# Patient Record
Sex: Male | Born: 1940 | Race: White | Hispanic: No | Marital: Married | Smoking: Former smoker
Health system: Southern US, Community
[De-identification: ages and names within clinical notes are randomized; demographics above are authoritative.]

## PROBLEM LIST (undated history)

## (undated) DIAGNOSIS — G4730 Sleep apnea, unspecified: Secondary | ICD-10-CM

## (undated) DIAGNOSIS — G629 Polyneuropathy, unspecified: Secondary | ICD-10-CM

## (undated) DIAGNOSIS — H539 Unspecified visual disturbance: Secondary | ICD-10-CM

## (undated) DIAGNOSIS — M1990 Unspecified osteoarthritis, unspecified site: Secondary | ICD-10-CM

## (undated) DIAGNOSIS — N429 Disorder of prostate, unspecified: Secondary | ICD-10-CM

## (undated) DIAGNOSIS — G2581 Restless legs syndrome: Secondary | ICD-10-CM

## (undated) DIAGNOSIS — E785 Hyperlipidemia, unspecified: Secondary | ICD-10-CM

## (undated) DIAGNOSIS — M5430 Sciatica, unspecified side: Secondary | ICD-10-CM

## (undated) DIAGNOSIS — F909 Attention-deficit hyperactivity disorder, unspecified type: Secondary | ICD-10-CM

## (undated) DIAGNOSIS — N529 Male erectile dysfunction, unspecified: Secondary | ICD-10-CM

## (undated) HISTORY — DX: Unspecified visual disturbance: H53.9

## (undated) HISTORY — DX: Unspecified osteoarthritis, unspecified site: M19.90

## (undated) HISTORY — DX: Polyneuropathy, unspecified: G62.9

## (undated) HISTORY — DX: Attention-deficit hyperactivity disorder, unspecified type: F90.9

## (undated) HISTORY — PX: COSMETIC SURGERY: SHX468

## (undated) HISTORY — DX: Sciatica, unspecified side: M54.30

## (undated) HISTORY — DX: Disorder of prostate, unspecified: N42.9

## (undated) HISTORY — DX: Hyperlipidemia, unspecified: E78.5

## (undated) HISTORY — DX: Male erectile dysfunction, unspecified: N52.9

## (undated) HISTORY — DX: Sleep apnea, unspecified: G47.30

## (undated) HISTORY — DX: Restless legs syndrome: G25.81

---

## 1995-10-04 DIAGNOSIS — I879 Disorder of vein, unspecified: Secondary | ICD-10-CM

## 1995-10-04 HISTORY — DX: Disorder of vein, unspecified: I87.9

## 1999-09-06 ENCOUNTER — Ambulatory Visit
Admit: 1999-09-06 | Disposition: A | Discharge status: Home / Self Care | Payer: Self-pay | Source: Ambulatory Visit | Admitting: Internal Medicine

## 1999-10-14 ENCOUNTER — Ambulatory Visit
Admit: 1999-10-14 | Disposition: A | Discharge status: Home / Self Care | Payer: Self-pay | Source: Ambulatory Visit | Admitting: Critical Care Medicine

## 2000-07-06 ENCOUNTER — Inpatient Hospital Stay
Admission: RE | Admit: 2000-07-06 | Disposition: A | Discharge status: Home / Self Care | Payer: Self-pay | Source: Ambulatory Visit | Admitting: Urology

## 2000-07-14 ENCOUNTER — Emergency Department: Admit: 2000-07-14 | Payer: Self-pay | Source: Emergency Department | Admitting: Emergency Medicine

## 2000-09-17 ENCOUNTER — Inpatient Hospital Stay
Admission: EM | Admit: 2000-09-17 | Disposition: A | Discharge status: Home / Self Care | Payer: Self-pay | Source: Emergency Department | Admitting: Internal Medicine

## 2001-09-03 ENCOUNTER — Ambulatory Visit
Admit: 2001-09-03 | Disposition: A | Discharge status: Home / Self Care | Payer: Self-pay | Source: Ambulatory Visit | Admitting: Internal Medicine

## 2002-07-31 ENCOUNTER — Ambulatory Visit
Admission: AD | Admit: 2002-07-31 | Disposition: A | Discharge status: Home / Self Care | Payer: Self-pay | Source: Ambulatory Visit

## 2002-08-06 ENCOUNTER — Ambulatory Visit
Admission: RE | Admit: 2002-08-06 | Disposition: A | Discharge status: Home / Self Care | Payer: Self-pay | Source: Ambulatory Visit

## 2003-03-31 DIAGNOSIS — N400 Enlarged prostate without lower urinary tract symptoms: Secondary | ICD-10-CM | POA: Insufficient documentation

## 2003-04-14 ENCOUNTER — Ambulatory Visit: Admission: RE | Admit: 2003-04-14 | Payer: Self-pay | Source: Ambulatory Visit | Admitting: Gastroenterology

## 2003-04-17 DIAGNOSIS — E785 Hyperlipidemia, unspecified: Secondary | ICD-10-CM | POA: Insufficient documentation

## 2005-03-30 ENCOUNTER — Emergency Department: Admit: 2005-03-30 | Payer: Self-pay | Source: Emergency Department | Admitting: Emergency Medical Services

## 2005-07-15 ENCOUNTER — Ambulatory Visit: Admission: RE | Admit: 2005-07-15 | Payer: Self-pay | Source: Ambulatory Visit | Admitting: Gastroenterology

## 2006-10-03 HISTORY — PX: HERNIA REPAIR: SHX51

## 2006-12-12 ENCOUNTER — Ambulatory Visit
Admit: 2006-12-12 | Disposition: A | Discharge status: Home / Self Care | Payer: Self-pay | Source: Ambulatory Visit | Admitting: Family Medicine

## 2007-11-04 ENCOUNTER — Emergency Department: Admit: 2007-11-04 | Payer: Self-pay | Source: Emergency Department | Admitting: Emergency Medicine

## 2007-11-07 ENCOUNTER — Ambulatory Visit
Admission: RE | Admit: 2007-11-07 | Disposition: A | Discharge status: Home / Self Care | Payer: Self-pay | Source: Ambulatory Visit | Admitting: Family Medicine

## 2007-11-12 ENCOUNTER — Ambulatory Visit
Admit: 2007-11-12 | Disposition: A | Discharge status: Home / Self Care | Payer: Self-pay | Source: Ambulatory Visit | Admitting: Family Medicine

## 2008-07-06 ENCOUNTER — Emergency Department: Admit: 2008-07-06 | Payer: Self-pay | Source: Emergency Department | Admitting: Emergency Medical Services

## 2009-09-02 ENCOUNTER — Emergency Department: Admit: 2009-09-02 | Payer: Self-pay | Source: Emergency Department | Admitting: Emergency Medicine

## 2009-09-02 LAB — URINALYSIS WITH MICROSCOPIC
Bilirubin, UA: NEGATIVE
Blood, UA: NEGATIVE
Glucose, UA: NEGATIVE
Ketones UA: NEGATIVE
Leukocyte Esterase, UA: NEGATIVE
Nitrite, UA: NEGATIVE
Protein, UR: NEGATIVE
Specific Gravity UA POCT: 1.02 (ref 1.001–1.035)
Urine pH: 5.5 (ref 5.0–8.0)
Urobilinogen, UA: 0.2 mg/dL
WBC, UA: NONE SEEN /HPF (ref 0–5)

## 2010-06-09 ENCOUNTER — Ambulatory Visit: Admission: RE | Admit: 2010-06-09 | Payer: Self-pay | Source: Ambulatory Visit | Admitting: Otolaryngology

## 2010-07-28 ENCOUNTER — Ambulatory Visit: Payer: Self-pay

## 2010-07-28 ENCOUNTER — Ambulatory Visit
Admission: RE | Admit: 2010-07-28 | Disposition: A | Discharge status: Home / Self Care | Payer: Self-pay | Source: Ambulatory Visit | Admitting: Gastroenterology

## 2010-07-29 LAB — LAB USE ONLY - HISTORICAL SURGICAL PATHOLOGY

## 2010-08-06 ENCOUNTER — Emergency Department: Admit: 2010-08-06 | Payer: Self-pay | Source: Emergency Department | Admitting: Emergency Medicine

## 2010-08-16 ENCOUNTER — Emergency Department: Admit: 2010-08-16 | Payer: Self-pay | Source: Emergency Department

## 2010-10-03 HISTORY — PX: KNEE ARTHROSCOPY W/ MENISCAL REPAIR: SHX1877

## 2010-10-07 ENCOUNTER — Emergency Department: Admit: 2010-10-07 | Payer: Self-pay | Source: Emergency Department | Admitting: Medical

## 2010-10-11 ENCOUNTER — Ambulatory Visit
Admit: 2010-10-11 | Disposition: A | Discharge status: Home / Self Care | Payer: Self-pay | Source: Ambulatory Visit | Admitting: Orthopaedic Surgery

## 2010-10-11 LAB — BASIC METABOLIC PANEL
Anion Gap: 6 (ref 5.0–15.0)
BUN: 17 mg/dL (ref 7–21)
CO2: 29 mEq/L (ref 22–31)
Calcium: 9.1 mg/dL (ref 8.6–10.2)
Chloride: 106 mEq/L (ref 98–107)
Creatinine: 0.8 mg/dL (ref 0.5–1.4)
Glucose: 90 mg/dL (ref 70–100)
Potassium: 5 mEq/L (ref 3.6–5.0)
Sodium: 141 mEq/L (ref 136–143)

## 2010-10-11 LAB — GFR: EGFR: >60

## 2010-10-11 LAB — CBC
Hematocrit: 40.4 % — ABNORMAL LOW (ref 42.0–52.0)
Hgb: 13.2 g/dL (ref 13.0–17.0)
MCH: 30.6 pg (ref 28.0–32.0)
MCHC: 32.7 g/dL (ref 32.0–36.0)
MCV: 93.5 fL (ref 80.0–100.0)
MPV: 9.9 fL (ref 9.4–12.3)
Platelets: 192 10*3/uL (ref 140–400)
RBC: 4.32 10*6/uL — ABNORMAL LOW (ref 4.70–6.00)
RDW: 13 % (ref 12–15)
WBC: 4.73 10*3/uL (ref 3.50–10.80)

## 2011-04-17 ENCOUNTER — Emergency Department: Admit: 2011-04-17 | Disposition: A | Discharge status: Home / Self Care | Payer: Self-pay | Source: Emergency Department

## 2011-07-12 ENCOUNTER — Ambulatory Visit
Admit: 2011-07-12 | Discharge: 2011-07-12 | Disposition: A | Discharge status: Home / Self Care | Payer: Self-pay | Source: Ambulatory Visit

## 2011-07-14 ENCOUNTER — Ambulatory Visit
Admit: 2011-07-14 | Discharge: 2011-07-14 | Disposition: A | Discharge status: Home / Self Care | Payer: Self-pay | Source: Ambulatory Visit

## 2011-07-14 LAB — CBC
Hematocrit: 43.5 % (ref 42.0–52.0)
Hgb: 14.9 g/dL (ref 13.0–17.0)
MCH: 31.9 pg (ref 28.0–32.0)
MCHC: 34.3 g/dL (ref 32.0–36.0)
MCV: 93.1 fL (ref 80.0–100.0)
MPV: 10.3 fL (ref 9.4–12.3)
Nucleated RBC: 0 /100 WBC
Platelets: 202 10*3/uL (ref 140–400)
RBC: 4.67 10*6/uL — ABNORMAL LOW (ref 4.70–6.00)
RDW: 13 % (ref 12–15)
WBC: 6.3 10*3/uL (ref 3.50–10.80)

## 2011-07-14 LAB — BASIC METABOLIC PANEL
Anion Gap: 7 (ref 5.0–15.0)
BUN: 21 mg/dL (ref 7–21)
CO2: 28 mEq/L (ref 22–31)
Calcium: 9.1 mg/dL (ref 8.6–10.2)
Chloride: 104 mEq/L (ref 98–107)
Creatinine: 1 mg/dL (ref 0.5–1.4)
Glucose: 89 mg/dL (ref 70–100)
Potassium: 4.2 mEq/L (ref 3.6–5.0)
Sodium: 139 mEq/L (ref 136–143)

## 2011-07-14 LAB — GFR: EGFR: >60

## 2011-07-15 ENCOUNTER — Emergency Department
Admit: 2011-07-15 | Disposition: A | Discharge status: Home / Self Care | Payer: Self-pay | Source: Emergency Department | Admitting: Physician Assistant

## 2011-07-19 ENCOUNTER — Emergency Department
Admit: 2011-07-19 | Disposition: A | Discharge status: Home / Self Care | Payer: Self-pay | Source: Emergency Department | Admitting: Physician Assistant

## 2011-07-22 NOTE — Op Note (Signed)
[name], [name]      MRN:          [id number]      Account:      [id number]      Document ID:  [id number]      Procedure Date: 06/09/2010            Admit Date: 06/09/2010            Patient Location: DISCHARGED 06/09/2010      Patient Type: A            SURGEON: Veneta Penton MD      ASSISTANT: Lorn Junes, DO                  PREOPERATIVE DIAGNOSES:      Brow ptosis and bilateral upper blepharochalasis.            POSTOPERATIVE DIAGNOSES:      Brow ptosis and bilateral upper blepharochalasis.            TITLE OF PROCEDURE:      Mid forehead brow lift, bilateral upper blepharoplasties.            ANESTHESIA:      General by laryngeal mask airway and a combination of 1% lidocaine with      epinephrine 1:100,000 dilution and 0.25% Marcaine with epinephrine      1:200,000 dilution, mixed in equal parts used for subcutaneous      infiltration.            ESTIMATED BLOOD LOSS:      Minimal.            COMPLICATIONS:      None.            SPECIMENS:      None.            DESCRIPTION OF PROCEDURE:      The patient was marked preoperatively in the seated position with a planned      brow elevation asymmetrically positioned within forehead rhytids.  The maximum      point of width was corresponding to the lateral edge of each pupil.  A 10-mm      ellipse was contoured corresponding to the underlying transverse forehead      rhytid. These were also separated by uninvolved central forehead skin.  The      area of the supratrochlear notch and supraorbital notch was demarcated as well.      I then marked out the area of the supratarsal crease along each upper eyelid      while also elevating the brow corresponding amount as planned for the browlift.       The amount of skin excess that was to be trimmed was estimated to       approximately 18 mm at the greatest with.  The ellipse was designed       bilaterally and then the patient was taken to the                                   Page 1 of 2      [name], [name]      MRN:          [id number]      Account:      [id number]      Document ID:  [id number]      Procedure  Date: 06/09/2010            operating room.  General anesthetic was given by laryngeal mask airway and      then local anesthetic was infiltrated along each of the forehead skin      ellipse patterns.  The entire facial area was then prepped with Betadine      solution and sterile drapes were applied.  The skin ellipse was excised on      the right mid-forehead, including dermis but just down into the immediate      subcutaneous plane.  I then elevated the inferior limb extending toward the      brow in an immediate subcutaneous plane, dissecting carefully and being      cautious not to injure any of the neurovascular tissues.  A meticulous      cautery was then performed with bipolar forceps prior to wound closure.  A      5-0 Monocryl were used to approximate the subdermal plane in an interrupted      buried fashion and then a 5-0 Prolene subcuticular was used to close the      wound margins.  A similar procedure was performed for the left brow lift.            I then examined the amount of skin redundancy that had been estimated      earlier with gentle downward pressure on the eyebrow, corresponding to the      effects of gravity.  The amount of skin redundancy was estimated to be      accurate, so I infiltrated the right upper eyelid and then the left upper      eyelid with local anesthetic mixture.  The skin was excised in the      immediate subcutaneous plane.  A small amount of orbital septum was incised      nasally and fat was delivered from the nasal compartment of the orbital      cavity in a gentle fashion.  This was cross-clamped, divided, and then a      hand-held thermal cautery used to coagulate the cut margin.  The wound was      then closed with interrupted 6-0 Prolene sutures temporally and a running      subcuticular Prolene for the remainder of the wound closure.  A similar       procedure was then performed for the left upper eyelid.  TobraDex ointment      was applied to these wounds.  The forehead incisions were approximated      earlier but Steri-Strips were now placed in a sterile fashion.  The wound      was covered with sterile 4 x 4's and a piece of gauze.  The patient was      awakened, the laryngeal mask airway was removed and he was taken to the      recovery room in stable condition.                        Electronic Signing Provider            D:  06/09/2010 20:12 PM by Dr. Veneta Penton, MD ([id number])      T:  06/10/2010 10:02 AM by [id number]                  cc:  Page 2 of 2      Authenticated and Edited by Veneta Penton [phone]) On 07/22/10 10:48:17 AM

## 2011-08-05 LAB — ECG 12-LEAD
Atrial Rate: 71 {beats}/min
P Axis: 101 degrees
P-R Interval: 162 ms
Q-T Interval: 394 ms
QRS Duration: 98 ms
QTC Calculation (Bezet): 428 ms
R Axis: -31 degrees
T Axis: 50 degrees
Ventricular Rate: 71 {beats}/min

## 2011-09-20 NOTE — Op Note (Signed)
Introduction: [id number] Document ID: [id number] -- 70 year old male      patient presents for an outpatient Colonoscopy on 07/28/2010.            Indications: Hematochezia (569.3).            Consent: The benefits, risks, and alternatives to the procedure were      discussed and informed consent was obtained from the patient.            Preparation: EKG, pulse, pulse oximetry, and blood pressure were monitored      throughout the procedure.            Medications: IVA anesthesia.            Rectal Exam: Normal rectal exam. Small external hemorrhoids (bled on      contact).            Procedure: The colonoscope was passed through the anus under direct      visualization and was advanced with ease to the cecum, confirmed by      landmarks. The scope was withdrawn and the mucosa was carefully examined.      The quality of the preparation was excellent. The views were excellent.      The patient's toleration of the procedure was excellent. Retroflexion was      performed in the rectum.            Estimated Blood Loss: Negligible.            Findings: The colon appeared to be normal. A single polyp, measuring 2 mm      in size, was found in the sigmoid colon. The polyp was completely removed      by cold biopsy polypectomy. The polyp was retrieved.            Unplanned Events: There were no unplanned events.            Summary: Small external bleeding hemorrhoids were found (455.5). Normal      colon. A single polyp was found in the sigmoid colon (211.3); removed by      cold biopsy polypectomy.            Recommendations: Start high fiber diet. Follow-up on the results of the      biopsy specimens in 2 weeks. Colonoscopy recommended in 5 to 10 years.            Procedure Codes:            Performed By:      Version 1, electronically signed by Dr. Burnetta Sabin on 07/28/2010 at      10:36.

## 2011-10-04 DIAGNOSIS — T148XXA Other injury of unspecified body region, initial encounter: Secondary | ICD-10-CM

## 2011-10-04 HISTORY — PX: ANKLE FRACTURE SURGERY: SHX122

## 2011-10-04 HISTORY — DX: Other injury of unspecified body region, initial encounter: T14.8XXA

## 2012-02-13 ENCOUNTER — Ambulatory Visit: Payer: BC Managed Care – PPO | Attending: Surgery

## 2012-02-13 DIAGNOSIS — Z01818 Encounter for other preprocedural examination: Secondary | ICD-10-CM | POA: Insufficient documentation

## 2012-02-13 LAB — CBC AND DIFFERENTIAL
Basophils Absolute Automated: 0.01 10*3/uL (ref 0.00–0.20)
Basophils Automated: 0 % (ref 0–2)
Eosinophils Absolute Automated: 0.08 10*3/uL (ref 0.00–0.70)
Eosinophils Automated: 2 % (ref 0–5)
Hematocrit: 42.3 % (ref 42.0–52.0)
Hgb: 14.4 g/dL (ref 13.0–17.0)
Immature Granulocytes Absolute: 0.01 10*3/uL
Immature Granulocytes: 0 % (ref 0–1)
Lymphocytes Absolute Automated: 1.3 10*3/uL (ref 0.50–4.40)
Lymphocytes Automated: 30 % (ref 15–41)
MCH: 31.7 pg (ref 28.0–32.0)
MCHC: 34 g/dL (ref 32.0–36.0)
MCV: 93.2 fL (ref 80.0–100.0)
MPV: 10 fL (ref 9.4–12.3)
Monocytes Absolute Automated: 0.36 10*3/uL (ref 0.00–1.20)
Monocytes: 8 % (ref 0–11)
Neutrophils Absolute: 2.63 10*3/uL (ref 1.80–8.10)
Neutrophils: 60 % (ref 52–75)
Nucleated RBC: 0 /100 WBC
Platelets: 184 10*3/uL (ref 140–400)
RBC: 4.54 10*6/uL — ABNORMAL LOW (ref 4.70–6.00)
RDW: 13 % (ref 12–15)
WBC: 4.39 10*3/uL (ref 3.50–10.80)

## 2012-02-13 LAB — ECG 12-LEAD
Atrial Rate: 59 {beats}/min
P Axis: 16 degrees
P-R Interval: 154 ms
Q-T Interval: 430 ms
QRS Duration: 92 ms
QTC Calculation (Bezet): 425 ms
R Axis: -29 degrees
T Axis: 24 degrees
Ventricular Rate: 59 {beats}/min

## 2012-02-13 LAB — BASIC METABOLIC PANEL
BUN: 17 mg/dL (ref 8.0–20.0)
CO2: 24 mEq/L (ref 21–30)
Calcium: 9.4 mg/dL (ref 7.9–10.6)
Chloride: 107 mEq/L (ref 96–109)
Creatinine: 0.8 mg/dL (ref 0.5–1.5)
Glucose: 94 mg/dL (ref 70–100)
Potassium: 5 mEq/L (ref 3.5–5.3)
Sodium: 140 mEq/L (ref 135–146)

## 2012-02-13 LAB — GFR: EGFR: >60

## 2012-02-13 LAB — HEMOLYSIS INDEX: Hemolysis Index: 10 Index — ABNORMAL HIGH (ref 0–9)

## 2012-02-14 NOTE — Pre-Procedure Instructions (Signed)
Rev/Lab/EKG done 5/13

## 2012-03-05 ENCOUNTER — Ambulatory Visit: Payer: BC Managed Care – PPO

## 2012-03-05 NOTE — H&P (Signed)
[name] is an 71 y.o. male with umbilical bulge and discomfort.    Past Medical History   Diagnosis Date   . Hyperlipidemia    . Sleep apnea      documented w/ CPAP       Allergies: No Known Allergies    Active Problems:   * No active hospital problems. *     There were no vitals taken for this visit.    Review of Systems   All other systems reviewed and are negative.        Physical Exam   Constitutional: He appears well-developed and well-nourished.   HENT:   Head: Normocephalic and atraumatic.   Eyes: Conjunctivae are normal. Pupils are equal, round, and reactive to light.   Neck: Normal range of motion. Neck supple.   Cardiovascular: Normal rate and regular rhythm.    Pulmonary/Chest: Effort normal and breath sounds normal.   Abdominal: Soft. Bowel sounds are normal.       Assessment:  Umbilical hernia     Plan:  Surgical hernia    Verneda Skill  03/05/2012

## 2012-03-06 ENCOUNTER — Ambulatory Visit
Admission: RE | Admit: 2012-03-06 | Discharge: 2012-03-06 | Disposition: A | Discharge status: Home / Self Care | Payer: BC Managed Care – PPO | Source: Ambulatory Visit | Attending: Surgery | Admitting: Surgery

## 2012-03-06 ENCOUNTER — Ambulatory Visit: Payer: BC Managed Care – PPO | Admitting: Surgery

## 2012-03-06 ENCOUNTER — Ambulatory Visit: Payer: BC Managed Care – PPO | Admitting: Anesthesiology

## 2012-03-06 ENCOUNTER — Encounter
Admission: RE | Disposition: A | Discharge status: Home / Self Care | Payer: Self-pay | Source: Ambulatory Visit | Attending: Surgery

## 2012-03-06 ENCOUNTER — Encounter: Payer: Self-pay | Admitting: Anesthesiology

## 2012-03-06 DIAGNOSIS — G4733 Obstructive sleep apnea (adult) (pediatric): Secondary | ICD-10-CM | POA: Insufficient documentation

## 2012-03-06 DIAGNOSIS — K429 Umbilical hernia without obstruction or gangrene: Secondary | ICD-10-CM | POA: Insufficient documentation

## 2012-03-06 DIAGNOSIS — E785 Hyperlipidemia, unspecified: Secondary | ICD-10-CM | POA: Insufficient documentation

## 2012-03-06 SURGERY — HERNIORRHAPHY, UMBILICAL W/WO MESH
Anesthesia: Anesthesia General | Wound class: Clean

## 2012-03-06 MED ORDER — MEPERIDINE HCL 25 MG/ML IJ SOLN
12.5000 mg | INTRAMUSCULAR | Status: DC | PRN
Start: 2012-03-06 — End: 2012-03-06

## 2012-03-06 MED ORDER — FENTANYL CITRATE 0.05 MG/ML IJ SOLN
50.0000 ug | INTRAMUSCULAR | Status: DC | PRN
Start: 2012-03-06 — End: 2012-03-06

## 2012-03-06 MED ORDER — LIDOCAINE-EPINEPHRINE 1 %-1:100000 IJ SOLN
INTRAMUSCULAR | Status: DC | PRN
Start: 2012-03-06 — End: 2012-03-06
  Administered 2012-03-06 (×2): 5 mL

## 2012-03-06 MED ORDER — FENTANYL CITRATE 0.05 MG/ML IJ SOLN
INTRAMUSCULAR | Status: DC | PRN
Start: 2012-03-06 — End: 2012-03-06
  Administered 2012-03-06: 25 ug via INTRAVENOUS
  Administered 2012-03-06: 50 ug via INTRAVENOUS

## 2012-03-06 MED ORDER — HYDROMORPHONE HCL PF 1 MG/ML IJ SOLN
0.5000 mg | INTRAMUSCULAR | Status: DC | PRN
Start: 2012-03-06 — End: 2012-03-06

## 2012-03-06 MED ORDER — PROMETHAZINE HCL 25 MG/ML IJ SOLN
6.2500 mg | Freq: Once | INTRAMUSCULAR | Status: DC | PRN
Start: 2012-03-06 — End: 2012-03-06

## 2012-03-06 MED ORDER — SODIUM CHLORIDE 0.9 % IV MBP
1.0000 g | Freq: Three times a day (TID) | INTRAVENOUS | Status: DC
Start: 2012-03-06 — End: 2012-03-06

## 2012-03-06 MED ORDER — LACTATED RINGERS IV SOLN
INTRAVENOUS | Status: DC
Start: 2012-03-06 — End: 2012-03-06

## 2012-03-06 MED ORDER — HYDROCODONE-ACETAMINOPHEN 5-325 MG PO TABS
1.0000 | ORAL_TABLET | ORAL | Status: DC | PRN
Start: 2012-03-06 — End: 2012-03-06

## 2012-03-06 MED ORDER — PROPOFOL INFUSION 10 MG/ML
INTRAVENOUS | Status: DC | PRN
Start: 2012-03-06 — End: 2012-03-06
  Administered 2012-03-06: 140 ug/kg/min via INTRAVENOUS

## 2012-03-06 MED ORDER — ONDANSETRON HCL 4 MG/2ML IJ SOLN
4.0000 mg | Freq: Once | INTRAMUSCULAR | Status: DC | PRN
Start: 2012-03-06 — End: 2012-03-06

## 2012-03-06 MED ORDER — LACTATED RINGERS IV SOLN
INTRAVENOUS | Status: DC
Start: 2012-03-06 — End: 2012-03-06
  Administered 2012-03-06: 1000 mL via INTRAVENOUS

## 2012-03-06 MED ORDER — BUPIVACAINE HCL 0.5 % IJ SOLN
INTRAMUSCULAR | Status: DC | PRN
Start: 2012-03-06 — End: 2012-03-06
  Administered 2012-03-06 (×2): 5 mL

## 2012-03-06 MED ORDER — EPHEDRINE SULFATE 50 MG/ML IJ SOLN
INTRAMUSCULAR | Status: DC | PRN
Start: 2012-03-06 — End: 2012-03-06
  Administered 2012-03-06: 10 mg via INTRAVENOUS

## 2012-03-06 MED ORDER — PROPOFOL INFUSION 10 MG/ML
INTRAVENOUS | Status: DC | PRN
Start: 2012-03-06 — End: 2012-03-06
  Administered 2012-03-06: 200 mg via INTRAVENOUS

## 2012-03-06 MED ORDER — SODIUM CHLORIDE 0.9 % IR SOLN
Status: DC | PRN
Start: 2012-03-06 — End: 2012-03-06
  Administered 2012-03-06: 250 mL

## 2012-03-06 MED ORDER — LACTATED RINGERS IV SOLN
INTRAVENOUS | Status: DC | PRN
Start: 2012-03-06 — End: 2012-03-06

## 2012-03-06 MED ORDER — ONDANSETRON HCL 4 MG/2ML IJ SOLN
INTRAMUSCULAR | Status: DC | PRN
Start: 2012-03-06 — End: 2012-03-06
  Administered 2012-03-06: 4 mg via INTRAVENOUS

## 2012-03-06 MED ORDER — HYDROCODONE-ACETAMINOPHEN 5-325 MG PO TABS
ORAL_TABLET | ORAL | Status: AC
Start: 2012-03-06 — End: 2012-03-06
  Administered 2012-03-06: 1 via ORAL
  Filled 2012-03-06: qty 1

## 2012-03-06 SURGICAL SUPPLY — 31 items
ADHESIVE SKIN SURGISEAL .35ML (Suture) ×8 IMPLANT
BANDAGE ADHESIVE L2 YD X W6 IN STRETCH (Bandage) ×1
BANDAGE ADHESIVE L2 YD X W6 IN STRETCH NONWOVEN POROUS COVER-ROLL (Bandage) ×1 IMPLANT
BETADINE SCRUB SURGICAL 4OZ (Scrub Supplies) ×2 IMPLANT
BLADE SRGCLPR LF STRL PVT ADJ HD DISP (Blade) ×1
BLADE SURGICAL CLIPPER PIVOT ADJUSTABLE (Blade) ×1
BLADE SURGICAL CLIPPER PIVOT ADJUSTABLE HEAD 9661 PURPLE (Blade) ×1 IMPLANT
BNDG CVRL ADH 2YDX6IN PLSTR POLYACRYLATE (Bandage) ×1
CONTAINER SPEC 8OZ NS SNPON LID TRNLU (Suction) ×2 IMPLANT
DRESSING TRANSPARENT L4 3/4 IN X W4 IN (Dressing) ×1
DRESSING TRANSPARENT L4 3/4 IN X W4 IN POLYURETHANE ADHESIVE (Dressing) ×1 IMPLANT
DRESSING TRNS PU STD TGDRM 4.75X4IN LF (Dressing) ×1
GLOVE SURG BIOGEL PF LTX SZ7.0 (Glove) ×2 IMPLANT
PACK LAPAROTOMY CUSTOM (Pack) ×2 IMPLANT
PATCH SURGICAL SMALL CIRCLE MONOFILAMENT (Mesh) ×1 IMPLANT
PATCH SURGICAL SMALL CIRCLE MONOFILAMENT SELF EXPAND STRAP POCKET (Mesh) ×1 IMPLANT
SLEEVE CMPR MED KN LGTH KDL SCD 21- IN (Sleeve) ×2
SLEEVE COMPRESSION MEDIUM KNEE LENGTH KENDALL SEQUENTIAL OD21- IN (Sleeve) ×1 IMPLANT
SOLUTION IRR 0.9% NACL 1000ML LF STRL (Irrigation Solutions) ×1
SOLUTION IRRIGATION 0.9% SODIUM CHLORIDE (Irrigation Solutions) ×1
SOLUTION IRRIGATION 0.9% SODIUM CHLORIDE 1000 ML PLASTIC POUR BOTTLE (Irrigation Solutions) ×1 IMPLANT
SPONGE GAUZE 12PLY STRL 4X4IN (Sponge) ×2 IMPLANT
SUTURE ABS 3-0 SH VCL 27IN BRD COAT VIOL (Suture) ×1
SUTURE COATED VICRYL 3-0 SH L27 IN BRAID (Suture) ×1
SUTURE COATED VICRYL 3-0 SH L27 IN BRAID COATED VIOLET ABSORBABLE (Suture) ×1 IMPLANT
SUTURE ETHIBOND 0 CTX 8X18IN (Suture) ×2 IMPLANT
SUTURE PLAIN 2-0 CT1 27IN (Suture) ×2 IMPLANT
SUTURE VICRYL 4-0 PS2 27IN (Suture) ×2 IMPLANT
TOWEL STERILE REUSABLE 8PK (Procedure Accessories) ×2 IMPLANT
TRAY PH SKIN SCRUB GEL TRAY (Tray) ×2 IMPLANT
TRAY SKIN DRY SCRUB (Tray) ×2 IMPLANT

## 2012-03-06 NOTE — Discharge Instructions (Addendum)
PAIN PILL GIVEN AT 2:18    Pain Medication:   May use over the counter medications, Motrin , Advil, Aleeve.   Take prescription medication with some food to avoid upset stomach.   Take stool softner or laxitive while taking narcotic pain medications which will cause constipation.       Wound Care:   Apply ice pack for your comfort.   May shower in AM after removing the dressing.  If you have steri-strips, do not remove.   Expect some swelling and bruising around the surgical site  ( which may extend to the genital area if inguinal hernia.)    Activities:   As tolerated.   Avoid heavy lifting or straining till pain free.   Avoid driving while taking narcotic pain medications.     Diet:  As tolerated.    Follow up:   Call [phone] for appointment in 7 to 10 days post op.              Anesthesia: After Your Surgery  You've just had surgery. During surgery, you received medication called anesthesia to keep you comfortable and pain-free. After surgery, you may experience some pain or nausea. This is normal. Here are some tips for feeling better and recovering after surgery.     Stay on schedule with your medication.   Going Home  Your doctor or nurse will show you how to take care of yourself when you go home. He or she will also answer your questions. Have an adult family member or friend drive you home. For the first 24 hours after your surgery:   Do not drive or use heavy equipment.   Do not make important decisions or sign documents.   Avoid alcohol.   Have someone stay with you, if possible. They can watch for problems and help keep you safe.  Be sure to keep all follow-up doctor's appointments. And rest after your procedure for as long as your doctor tells you to.  Coping with Pain  If you have pain after surgery, pain medication will help you feel better. Take it as directed, before pain becomes severe. Also, ask your doctor or pharmacist about other ways to control pain, such as with heat, ice, and  relaxation. And follow any other instructions your surgeon or nurse gives you.  Tips for Taking Pain Medication  To get the best relief possible, remember these points:   Pain medications can upset your stomach. Taking them with a little food may help.   Most pain relievers taken by mouth need at least 20 to 30 minutes to take effect.   Taking medication on a schedule can help you remember to take it. Try to time your medication so that you can take it before beginning an activity, such as dressing, walking, or sitting down for dinner.   Constipation is a common side effect of pain medications. Drink lots of fluids. Eating fruit and vegetables can also help. Don't take laxatives unless your surgeon has prescribed them.   Mixing alcohol and pain medication can cause dizziness and slow your breathing. It can even be fatal. Don't drink alcohol while taking pain medication.   Pain medication can slow your reflexes. Don't drive or operate machinery while taking pain medication.  Managing Nausea  Some people have an upset stomach after surgery. This is often due to anesthesia, pain, pain medications, or the stress of surgery. The following tips will help you manage nausea and get good nutrition as  you recover. If you were on a special diet before surgery, ask your doctor if you should follow it during recovery. These tips may help:   Don't push yourself to eat. Your body will tell you what to eat and when.   Start off with liquids and soup. They are easier to digest.   Progress to semisolids (mashed potatoes, applesauce, and gelatin) as you feel ready.   Slowly move to solid foods. Don't eat fatty, rich, or spicy foods at first.   Don't force yourself to have three large meals a day. Instead, eat smaller amounts more often.   Take pain medications with a small amount of solid food, such as crackers or toast.  Call Your Surgeon If.   You still have pain an hour after taking medication (it may not be strong  enough).   You feel too sleepy, dizzy, or groggy (medication may be too strong).   You have side effects like nausea, vomiting, or skin changes (rash, itching, or hives).    [address], [address], [place], [place] [address]. All rights reserved. This information is not intended as a substitute for professional medical care. Always follow your healthcare professional's instructions.

## 2012-03-06 NOTE — Transfer of Care (Signed)
Anesthesia Transfer of Care Note    Patient: [name]    Procedures performed: Procedure(s) with comments:  HERNIORRHAPHY, UMBILICAL - HERNIORRHAPHY, UMBILICAL     Anesthesia type: General LMA    Patient location:PACU    Last vitals:   Filed Vitals:    03/06/12 1349   BP: 101/59   Pulse: 71       Resp: 16   SpO2: 99%       Post pain: Patient not complaining of pain, continue current therapy     Mental Status:awake and alert     Respiratory Function: tolerating face mask    Cardiovascular: stable    Nausea/Vomiting: patient not complaining of nausea or vomiting    Hydration Status: adequate    Post assessment: no apparent anesthetic complications, no reportable events and no evidence of recall

## 2012-03-06 NOTE — Brief Op Note (Signed)
BRIEF OP NOTE    Date Time: 03/06/2012 1:37 PM    Patient Name:   [name]    Date of Operation:   03/06/2012    Providers Performing:   Surgeon(s):  Verneda Skill, MD    Assistant (s):  Leticia Clas [facility]  Operative Procedure:   Procedure(s):  HERNIORRHAPHY, UMBILICAL    Preoperative Diagnosis:   Pre-Op Diagnosis Codes:     * Umbilical hernia without mention of obstruction or gangrene [553.1]    Postoperative Diagnosis:   * No post-op diagnosis entered *    Anesthesia:   Choice    Estimated Blood Loss:        Specimens:       Findings:   See my dictation    Complications:   none      Signed by: Verneda Skill, MD                                                                              [facility] OR

## 2012-03-06 NOTE — Progress Notes (Signed)
cpap checked by bio-med.  Placed in asc pacu

## 2012-03-06 NOTE — Anesthesia Postprocedure Evaluation (Signed)
Anesthesia Post Evaluation    Patient: [name]    Procedures performed: Procedure(s) with comments:  HERNIORRHAPHY, UMBILICAL - HERNIORRHAPHY, UMBILICAL     Anesthesia type: General LMA    Patient location: PACU    Post pain: Patient not complaining of pain, continue current therapy    Level of consciousness: awake and alert     Respiratory: tolerating room air    Cardiovascular: stable    Post assessment: no apparent anesthetic complications, no reportable events and no evidence of recall    Other complications: none

## 2012-03-06 NOTE — PACU (Signed)
Mid abdominal dressing is dry and clean.  No drains in place.

## 2012-03-06 NOTE — Anesthesia Preprocedure Evaluation (Addendum)
Anesthesia Evaluation    AIRWAY    Mallampati: III    TM distance: >3 FB  Neck ROM: limited  Mouth Opening:full   CARDIOVASCULAR    cardiovascular exam normal and regular     DENTAL         PULMONARY    pulmonary exam normal and clear to auscultation     OTHER FINDINGS    Labs wnl    EKG: SB, LAD, no ST changes          Anesthesia Plan    ASA 3   general         Post op pain management: per surgeon  informed consent obtained

## 2012-03-07 NOTE — Op Note (Signed)
Procedure Date: 03/06/2012     Patient Type: A     SURGEON: Verneda Skill MD  ASSISTANT:       PREOPERATIVE DIAGNOSIS:  Umbilical hernia.     POSTOPERATIVE DIAGNOSIS:  Umbilical hernia.       TITLE OF PROCEDURE:  Repair of umbilical hernia.     ANESTHESIA:  General with LMA.     INDICATIONS FOR THE SURGERY:    This is a 71 year old male with a symptomatic periumbilical bulge.  The  elective repair of umbilical hernia was recommended and discussed, and  informed consent was obtained prior to the procedure.     DESCRIPTION OF PROCEDURE:  The procedure is as follows:  The patient was placed on the operating room  table in supine position after all required preoperative procedures were  completed.  Then, after the induction of general anesthesia with LMA, the  mid abdomen was prepped and draped in usual sterile fashion.  After use of  local anesthetic, the periumbilical incision above the umbilicus was made;  and with dissection, the hernia sac was dissected to the level of the linea  alba.  Hernia sac was also dissected off the dermis of the umbilicus, and  this completed a complete circumferential dissection.  Hernia sac was then  incised at the level of the linea alba, and the content was reduced and the  anterior abdominal wall was clear of any adhesions.  Hernia defect measured  approximately 1 cm in diameter.  A small Ventralex mesh was then inserted  into the underlay position; and using attached polypropylene strap, the  polypropylene side of the mesh was secured to the fascial edges with 2-0  Ethibond sutures in interrupted fashion.  Adequate hemostasis was achieved  and the umbilicus was tacked against the repaired fascia to invert.  An  incision was closed with absorbable suture in multiple layers.  The patient  tolerated the procedure well and was transferred to recovery room in  satisfactory condition.  Estimated blood loss was minimum, and the counts  were correct at the end of the procedure.  There were  no intraoperative  complications.           D:  03/06/2012 18:03 PM by Dr. Verneda Skill, MD [id number])  T:  03/07/2012 06:56 AM by [id number]      Everlean Cherry: [id number]) (Doc ID: [id number])

## 2012-10-05 NOTE — Op Note (Signed)
MRN: [id number] DOCUMENT ID: [id number]      INTRODUCTION:      72 YEAR OLD MALE PATIENT PRESENTS FOR AN ELECTIVE OUTPATIENT COLONOSCOPY.      THE INDICATIONS FOR THE PROCEDURE WERE HEMATOCHEZIA AND FOLLOW-UP OF      COLONIC POLYPS.      CONSENT:      THE BENEFITS, RISKS, AND ALTERNATIVES TO THE PROCEDURE WERE DISCUSSED AND      INFORMED CONSENT WAS OBTAINED.      PREPARATION:      EKG, PULSE, PULSE OXIMETRY AND BLOOD PRESSURE MONITORED.      MEDICATIONS:      - MAC ANESTHESIA WAS ADMINISTERED DURING THE PROCEDURE      PROCEDURE:      RECTAL EXAM: SMALL BLEEDING EXTERNAL HEMORRHOIDS WERE PRESENT.      THE ENDOSCOPE WAS PASSED WITHOUT DIFFICULTY UNDER DIRECT VISUALIZATION TO      THE TERMINAL ILEUM CONFIRMED BY LANDMARKS, PHOTOGRAPH, APPENDICEAL ORIFICE      AND ILEOCECAL VALVE.  RETROFLEXION WAS PERFORMED IN THE RECTUM.  THE      QUALITY OF THE PREPARATION WAS VERY GOOD.  WITHDRAWAL TIME, CECUM TO END      LASTED 9 MINUTES.      FINDINGS:  SINGLE POLYP WAS SEEN IN THE TRANSVERSE COLON WHICH MEASURED 2      MM.  THE FIRST POLYP WAS REMOVED BY EXCISIONAL BIOPSY.  THE COLONOSCOPY      WAS OTHERWISE NORMAL.      COMPLICATIONS:      THERE WERE NO COMPLICATIONS ASSOCIATED WITH THE PROCEDURE.      IMPRESSION:      1.  SMALL BLEEDING EXTERNAL HEMORRHOIDS [455.5].      2.  SINGLE POLYP IN THE TRANSVERSE COLON. [211.3]. THE FIRST POLYP WAS      REMOVED BY EXCISIONAL BIOPSY.      3.  COLONOSCOPY, OTHERWISE NORMAL.      RECOMMENDATION:      - HIGH FIBER DIET.      - FOLLOW-UP ON THE RESULTS OF BIOPSY SPECIMENS IN 2 WEEKS.      - SCHEDULE COLONOSCOPY IN 5 YEARS.      SIGNING PHYSICIAN: Tayvion Lauder A

## 2013-06-19 ENCOUNTER — Encounter (INDEPENDENT_AMBULATORY_CARE_PROVIDER_SITE_OTHER): Payer: Self-pay | Admitting: Cardiovascular Disease

## 2013-06-19 ENCOUNTER — Ambulatory Visit (INDEPENDENT_AMBULATORY_CARE_PROVIDER_SITE_OTHER): Payer: BC Managed Care – PPO | Admitting: Cardiovascular Disease

## 2013-06-19 VITALS — BP 142/82 | HR 74 | Ht 73.0 in | Wt 193.0 lb

## 2013-06-19 DIAGNOSIS — R42 Dizziness and giddiness: Secondary | ICD-10-CM | POA: Insufficient documentation

## 2013-06-19 NOTE — Progress Notes (Signed)
[facility]- Cardiology Consultation Note    Chief Complaint   Patient presents with   . Dizziness       Referring Physician: Sheran Luz, MD     Date of Consultation: 06/19/2013      HPI:[name] is a 72 year old gentleman referred by Dr. Darcel Bayley for  episodes of feeling dizzy.  He has not had any spells which last about 5 to  10 seconds, when he feels dizzy like he is going to faint, but he has not  actually fainted.  He feels a little facial flushing at the time.  He has  never had true syncope.  He has no associated symptoms of tachycardia with  it.       Extensive cardiac review of systems is negative.  He has never had  exertional chest pain or chest pressure.  He has not had PND, orthopnea, or  lower extremity edema.  He took a 10 mile kayaking trip and was able to do  physical exertion this past month and had no chest pain or shortness of  breath with that.     With regard to risk factors for coronary disease, he has no history of  hypertension, diabetes, or tobacco abuse.  He does not have  hypercholesterolemia.  There is no family history for premature coronary  disease.     ALLERGIES:  None known.     PAST SURGICAL HISTORY:  Remarkable for left knee surgery and left ankle surgery.     PAST MEDICAL HISTORY:  Remarkable for restless leg syndrome and difficulty sleeping.     PRESENT MEDICAL REGIMEN:  Trazodone 150 mg p.o. nightly, Requip for restless legs, Xalatan p.r.n.,  Cialis p.r.n.          Review of Systems:  All other systems were reviewed and are negative unless stated above per HPI    Past Medical History   Diagnosis Date   . Hyperlipidemia    . Sleep apnea      documented w/ CPAP       Past Surgical History   Procedure Date   . Left leg fracture 2012   . Colonoscopy    . Repair torn meniscus 2012     left       History     Social History   . Marital Status: Married     Spouse Name: N/A     Number of Children: N/A   . Years of Education: N/A     Occupational History   . Not  on file.     Social History Main Topics   . Smoking status: Never Smoker    . Smokeless tobacco: Not on file   . Alcohol Use: Yes      Comment: 1-2 beers daily   . Drug Use: No   . Sexually Active:      Other Topics Concern   . Not on file     Social History Narrative   . No narrative on file           No Known Allergies    Current outpatient prescriptions:amphetamine-dextroamphetamine (ADDERALL) 10 MG tablet, , Disp: , Rfl: ;  atorvastatin (LIPITOR) 10 MG tablet, , Disp: , Rfl: ;  pravastatin (PRAVACHOL) 10 MG tablet, Take 10 mg by mouth every evening., Disp: , Rfl: ;  ROPINIRole HCl (REQUIP PO), Take 1 tablet by mouth daily., Disp: , Rfl: ;  traZODone (DESYREL) 150 MG tablet, Take 150 mg by mouth  nightly., Disp: , Rfl:   zaleplon (SONATA) 10 MG capsule, , Disp: , Rfl:       Physical Examination:  Filed Vitals:    06/19/13 1529   BP: 142/82   Pulse: 74     General: In NAD, well developed, well nourished.  HEENT: sclera is normal, mucous membranes are moist, neck is supple. No carotid bruits  Cardiovascular: S1 S2 heard, regular rate and rhythm, without murmurs, rubs or gallops  Lungs: Clear to auscultation b/l, normal excursion, no rhonchi, rales or wheezes b/l  Abdomen: +bs, soft, nontender, non-distended, no organomegaly   Extremities: no edema, 2+ pulses b/l in upper and lower extremities  Skin: no rashes or lesions noted  Neurological: nonfocal, moves all extremities well  Psychiatric: alert and oriented x 3, normal affect  Lymphatics: no lymphadenopathy        Impression and Recommendations:  [name] is a 72 y.o. who presents for consultation regarding dizzy spells    1. Dizziness and giddiness        EKG today showed NSR  ASSESSMENT AND PLAN:  1.  Dizzy spells -  The patient's physical examination today is normal.  EKG showed  normal sinus rhythm.  The differential, however, would include bradycardia  or tachycardia.  His EKG shows left anterior fascicular block.  Based on  this, a 24-hour Holter  monitor will be scheduled with any further  recommendations depending on the results.    2.  Elevated systolic pressure.  The patient was counseled to record serial  blood pressure readings at home and report back to [place] with the results.       Any further recommendations depending on the results of his Holter.        Thank you for allowing [place] to participate in this patient's care. Please call me with any questions.    Sylvan Cheese, M.D., [facility]

## 2013-06-24 ENCOUNTER — Ambulatory Visit (INDEPENDENT_AMBULATORY_CARE_PROVIDER_SITE_OTHER): Payer: BC Managed Care – PPO | Admitting: Cardiovascular Disease

## 2013-06-24 DIAGNOSIS — R42 Dizziness and giddiness: Secondary | ICD-10-CM

## 2013-06-24 NOTE — Procedures (Signed)
Patient connected to 24-hour Holter monitor.  Provided instructions on wearing monitor; instructed to record activities in monitor journal.  Pt verbalized understanding.

## 2013-06-27 ENCOUNTER — Telehealth (INDEPENDENT_AMBULATORY_CARE_PROVIDER_SITE_OTHER): Payer: Self-pay | Admitting: Cardiovascular Disease

## 2013-06-27 NOTE — Procedures (Signed)
Mr. [name] is a 72 year old gentleman who had a 24-hour Holter  monitor performed on June 24, 2013.  The predominant rhythm was normal  sinus.  Average heart rate 79.  Minimum heart rate 47.  Maximum heart rate  119.  There were occasional PVCs.  There were occasional premature atrial  contractions (PACs) and nonsustained supraventricular tachycardia.  No  atrial fibrillation was treated and no ventricular tachycardia was seen.   No symptoms were noted.

## 2013-06-27 NOTE — Telephone Encounter (Signed)
From Monday 9/22  [phone]  Thank you.

## 2013-06-28 NOTE — Telephone Encounter (Signed)
PER [facility]/SCHEDULED ECHO

## 2013-07-01 ENCOUNTER — Ambulatory Visit (INDEPENDENT_AMBULATORY_CARE_PROVIDER_SITE_OTHER): Payer: BC Managed Care – PPO | Admitting: Cardiovascular Disease

## 2013-07-16 ENCOUNTER — Telehealth (INDEPENDENT_AMBULATORY_CARE_PROVIDER_SITE_OTHER): Payer: Self-pay | Admitting: Cardiovascular Disease

## 2013-07-16 NOTE — Telephone Encounter (Addendum)
FROM 9/29:  [phone]  THANK YOU

## 2013-07-17 ENCOUNTER — Telehealth (INDEPENDENT_AMBULATORY_CARE_PROVIDER_SITE_OTHER): Payer: Self-pay | Admitting: Cardiovascular Disease

## 2013-07-17 NOTE — Telephone Encounter (Signed)
Patient returning your call regarding echo results.  Patient's # [phone]    Thank you,  [name]    07/16/2013 4:33 PM   Telephone   MRN: [id number]    Description:  72 year old male   Provider:  Marcille Buffy, MD   Department:  [facility]         Reason for Call      ?ECHO RESULTS?          Call Documentation      Vance Peper  07/16/2013  4:40 PM  Addendum   FROM 9/29:  [phone]  [facility]         Dear [name],    Thank you for referring Mr. Sheryle Spray to me for evaluation. Below you will find my report on the echo performed at our facility.I can  find no cardiac etiology for his dizzy spells.    I appreciate the opportunity to participate in the care of your patient.  Please do not hesitate to contact me with any questions or concerns.    Sincerely,    Marcille Buffy, MD, [facility]                                                                                ECHOCARDIOGRAM REPORT    Name: [name] Age: 72 y.o. Gender: male Date: 07/01/2013   Clinical history: dizziness Height:  73in Weight: 193 lb   Physician: Vania Rea. Dareen Piano, M.D. cc: Sheran Luz, MD   Quality of study: Good Tech: MK BP 140/85 Tape Number: Dr Darlin Coco #2     M-MODE AND 2-D DIMENSIONS  Patient values  Normal Values  Patient Values  Normal Values   Aortic Root: 36 mm 20-37 mm RVIDd 23 mm 7-23 mm   Aortic Leaflet Separation: 23 mm 15-26 mm IVSDd 9 mm 6-11 mm   Left Atrium: 41 mm 18-40 mm LVPWDd 9 mm 6-11 mm   IVC 21 mm  LVIDd 54 mm 37-56 mm       LVIDs 32 mm 20-40 mm   Left Atrium [facility]):  mm 15-40 mm FS% 40 % 28-45%   Right Atrium [facility]):  mm (varies) EF% 60-65 % 50-75%     DOPPLER/HEMODYNAMIC ANALYSIS  MV Inflow:   normal Decel Time:     Estimated RVSP: 47mm Hg TR Vmax 3.52m/sec RAP estim: 5 mm Hg     Valve Pk Vel. Normal (m/sec) Regurgitation Area (cm2) Mn Grad. Pk Grad. LVOT   Aortic  1.0 1-1.7  moderate           Mitral 0.5 0.6-1.3  mild           Tricuspid  0.5 0.3-0.7  mild            Pulmonic  0.8 0.6-1.0  mild           LVOT  1.0 0.7-1.7                 FINAL IMPRESSIONS:  1. Left Ventricle: Normal left ventricular size, thickness, and function with an EF of 60-65% with no regional wall motion abnormalities.  2. Right Ventricle: Normal size and function.   3. Pulmonary Arteries: mild elevation pulmonary artery pressures with an estimated RVSP of 42mm Hg.  4. Left Atrium: dilated  5. Right Atrium: dilated  6. Aortic Valve: trileaflet, opens well with no stenosis ,moderate regurgitation.  7. Mitral Valve: normal structure and function, MAC , mild thickening with no stenosis mild regurgitation.  8. Tricuspid Valve: normal in morphology with mild regurgitation.  9. Pulmonic Valve: Trace PI by Doppler.  10. Pericardium: no pericardial effusion.   11. IVC: normal size. Normal respiratory changes.  12. Aorta: normal aortic root size, aortic arch normal size.  13. Diastolic Function: Tissue Doppler velocities and mitral inflow pattern consistent with normal diastolic function.  14. No old studies    Vania Rea. Dareen Piano, M.D., [facility]

## 2013-07-17 NOTE — Procedures (Signed)
ECHOCARDIOGRAM REPORT    Name: [name] Age: 72 y.o. Gender: male Date: 07/01/2013   Clinical history: dizziness Height:  73in Weight: 193 lb   Physician: Vania Rea. Dareen Piano, M.D. cc: Sheran Luz, MD   Quality of study: Good Tech: MK BP 140/85 Tape Number: Dr Darlin Coco #2     M-MODE AND 2-D DIMENSIONS  Patient values  Normal Values  Patient Values  Normal Values   Aortic Root: 36 mm 20-37 mm RVIDd 23 mm 7-23 mm   Aortic Leaflet Separation: 23 mm 15-26 mm IVSDd 9 mm 6-11 mm   Left Atrium: 41 mm 18-40 mm LVPWDd 9 mm 6-11 mm   IVC 21 mm  LVIDd 54 mm 37-56 mm       LVIDs 32 mm 20-40 mm   Left Atrium [facility]):  mm 15-40 mm FS% 40 % 28-45%   Right Atrium [facility]):  mm (varies) EF% 60-65 % 50-75%     DOPPLER/HEMODYNAMIC ANALYSIS  MV Inflow:   normal Decel Time:     Estimated RVSP: 47mm Hg TR Vmax 3.64m/sec RAP estim: 5 mm Hg     Valve Pk Vel. Normal (m/sec) Regurgitation Area (cm2) Mn Grad. Pk Grad. LVOT   Aortic  1.0 1-1.7  moderate           Mitral 0.5 0.6-1.3  mild           Tricuspid  0.5 0.3-0.7  mild           Pulmonic  0.8 0.6-1.0  mild           LVOT  1.0 0.7-1.7                 FINAL IMPRESSIONS:  1. Left Ventricle: Normal left ventricular size, thickness, and function with an EF of 60-65% with no regional wall motion abnormalities.  2. Right Ventricle: Normal size and function.   3. Pulmonary Arteries: mild elevation pulmonary artery pressures with an estimated RVSP of 42mm Hg.  4. Left Atrium: dilated  5. Right Atrium: dilated  6. Aortic Valve: trileaflet, opens well with no stenosis ,moderate regurgitation.  7. Mitral Valve: normal structure and function, MAC , mild thickening with no stenosis mild regurgitation.  8. Tricuspid Valve: normal in morphology with mild regurgitation.  9. Pulmonic Valve: Trace PI by Doppler.  10. Pericardium: no pericardial effusion.   11. IVC: normal size. Normal respiratory changes.  12. Aorta: normal  aortic root size, aortic arch normal size.  13. Diastolic Function: Tissue Doppler velocities and mitral inflow pattern consistent with normal diastolic function.  14. No old studies    Vania Rea. Dareen Piano, M.D., [facility]

## 2013-07-19 NOTE — Telephone Encounter (Signed)
Mr. Moyano is calling saying he did get a msg on his voicemail that was a man's voice but was not you and it said it was regarding his echo.  He is waiting to hear abt results.  [phone]  Thank you.

## 2013-07-22 NOTE — Telephone Encounter (Signed)
Called     kmr

## 2013-07-22 NOTE — Telephone Encounter (Signed)
[name] called this morning - he said he just realized the volume button on his phone was down to almost silent so any time you called he could not hear the ringer.  He offers profuse apologies.  A number today is [phone]  Thank you.

## 2013-07-31 NOTE — Telephone Encounter (Signed)
See telephone encounter 07/16/13.

## 2013-10-14 ENCOUNTER — Ambulatory Visit
Admission: RE | Admit: 2013-10-14 | Discharge: 2013-10-14 | Disposition: A | Discharge status: Home / Self Care | Payer: BC Managed Care – PPO | Source: Ambulatory Visit | Attending: Urology | Admitting: Urology

## 2013-10-14 DIAGNOSIS — R972 Elevated prostate specific antigen [PSA]: Secondary | ICD-10-CM | POA: Insufficient documentation

## 2013-10-14 LAB — PSA: Prostate Specific Antigen, Total: 4.898 ng/mL — ABNORMAL HIGH (ref 0.000–4.000)

## 2014-01-14 ENCOUNTER — Encounter (INDEPENDENT_AMBULATORY_CARE_PROVIDER_SITE_OTHER): Payer: Self-pay | Admitting: Cardiovascular Disease

## 2014-03-04 ENCOUNTER — Ambulatory Visit (INDEPENDENT_AMBULATORY_CARE_PROVIDER_SITE_OTHER): Payer: BC Managed Care – PPO | Admitting: Family Medicine

## 2014-03-04 ENCOUNTER — Encounter (INDEPENDENT_AMBULATORY_CARE_PROVIDER_SITE_OTHER): Payer: Self-pay

## 2014-03-04 VITALS — BP 138/79 | HR 75 | Temp 97.7°F | Resp 16 | Ht 73.0 in | Wt 180.0 lb

## 2014-03-04 DIAGNOSIS — H6591 Unspecified nonsuppurative otitis media, right ear: Secondary | ICD-10-CM

## 2014-03-04 DIAGNOSIS — H6590 Unspecified nonsuppurative otitis media, unspecified ear: Secondary | ICD-10-CM

## 2014-03-04 MED ORDER — CEFDINIR 300 MG PO CAPS
ORAL_CAPSULE | ORAL | Status: DC
Start: 2014-03-04 — End: 2014-03-30

## 2014-03-04 NOTE — Progress Notes (Signed)
Subjective:       Patient ID: [name] is a 73 y.o. male.  Chief Complaint   Patient presents with   . Otalgia     c/o right ear pain for 2 days. H/o ear drum perforation. Self medicated with debrox.       Otalgia   There is pain in the right ear. The current episode started in the past 7 days. The problem occurs constantly. The problem has been gradually worsening. There has been no fever. The pain is mild. Associated symptoms include ear discharge. Pertinent negatives include no abdominal pain, coughing, diarrhea, headaches, hearing loss, neck pain, rhinorrhea, sore throat or vomiting. He has tried ear drops for the symptoms. The treatment provided mild relief.       The following portions of the patient's history were reviewed and updated as appropriate: allergies, current medications, past family history, past medical history, past social history, past surgical history and problem list.    Review of Systems   Constitutional: Negative for fever.   HENT: Positive for ear discharge and ear pain. Negative for hearing loss, rhinorrhea, sore throat and trouble swallowing.    Eyes: Negative for pain.   Respiratory: Negative for cough.    Gastrointestinal: Negative for vomiting, abdominal pain and diarrhea.   Musculoskeletal: Negative for neck pain.   Neurological: Negative for headaches.           Objective:     Physical Exam   Nursing note and vitals reviewed.  Constitutional: He appears well-developed and well-nourished. No distress.   HENT:   Head: Normocephalic and atraumatic.   Right Ear: Hearing, external ear and ear canal normal. Tympanic membrane is erythematous and bulging. A middle ear effusion is present.   Left Ear: Hearing, tympanic membrane, external ear and ear canal normal.   Mouth/Throat: Oropharynx is clear and moist.   Eyes: Conjunctivae and EOM are normal.   Neck: Normal range of motion. Neck supple.   Cardiovascular: Normal rate, regular rhythm and normal heart sounds.    Pulmonary/Chest:  Effort normal and breath sounds normal.   Musculoskeletal: Normal range of motion.   Neurological: He is alert.   Skin: Skin is warm and dry.   Psychiatric: He has a normal mood and affect.               Assessment & Plan    1. Otitis media with effusion, right    - cefdinir (OMNICEF) 300 MG capsule; One capsule by mouth twice daily x 10 days  Dispense: 20 capsule; Refill: 0  Reviewed prescribed medications and side effects with patient.

## 2014-03-30 ENCOUNTER — Emergency Department: Payer: BC Managed Care – PPO

## 2014-03-30 ENCOUNTER — Emergency Department
Admission: EM | Admit: 2014-03-30 | Discharge: 2014-03-30 | Disposition: A | Discharge status: Home / Self Care | Payer: BC Managed Care – PPO | Attending: Emergency Medical Services | Admitting: Emergency Medical Services

## 2014-03-30 DIAGNOSIS — S63502A Unspecified sprain of left wrist, initial encounter: Secondary | ICD-10-CM

## 2014-03-30 DIAGNOSIS — S63509A Unspecified sprain of unspecified wrist, initial encounter: Secondary | ICD-10-CM | POA: Insufficient documentation

## 2014-03-30 DIAGNOSIS — IMO0002 Reserved for concepts with insufficient information to code with codable children: Secondary | ICD-10-CM | POA: Insufficient documentation

## 2014-03-30 DIAGNOSIS — T148XXA Other injury of unspecified body region, initial encounter: Secondary | ICD-10-CM

## 2014-03-30 HISTORY — DX: Restless legs syndrome: G25.81

## 2014-03-30 MED ORDER — HYDROCODONE-ACETAMINOPHEN 5-325 MG PO TABS
1.00 | ORAL_TABLET | Freq: Four times a day (QID) | ORAL | Status: DC | PRN
Start: 2014-03-30 — End: 2015-04-20

## 2014-03-30 MED ORDER — HYDROCODONE-ACETAMINOPHEN 5-325 MG PO TABS
1.00 | ORAL_TABLET | Freq: Once | ORAL | Status: AC
Start: 2014-03-30 — End: 2014-03-30
  Administered 2014-03-30: 1 via ORAL
  Filled 2014-03-30: qty 1

## 2014-03-30 NOTE — Discharge Instructions (Signed)
Abrasion    You have been diagnosed with an abrasion. This is a scrape of the outer skin layers.    Take off old dressings every day. Then put on a clean, dry dressing. If the dressing sticks to the wound, moisten it with water. This way, it can come off more easily.    Keep the wound clean and dry for the next 24 hours. You can wash the wound gently with soap and water. Then put on a dry bandage if needed, to protect it.    Put a thin layer of antibiotic ointment on the wound 2-3 times a day. This can be Polysporin / triple antibiotic. This can help prevent infection. It may help keep scarring to a minimum.    YOU SHOULD SEEK MEDICAL ATTENTION IMMEDIATELY, EITHER HERE OR AT THE NEAREST EMERGENCY DEPARTMENT, IF ANY OF THE FOLLOWING OCCURS:   Unusual redness or swelling.   There are red streaks going up the arm or leg.   The wound smells bad or has a lot of drainage.   Fever (temperature higher than 100.4F / 38C), chills, more pain and / or swelling.

## 2014-03-30 NOTE — ED Notes (Signed)
Cutting wood with a table saw and the block of wood hit fence and then flew back and hit his chest and cut his arm. Small laceration and large bruise; +swelling. 10/10 pain.

## 2014-03-31 NOTE — ED Provider Notes (Signed)
Physician/Midlevel provider first contact with patient: 03/30/14 2111     73 yom, was using a table saw to cut a piece of wood, the block of wood hit the fence and flew back and hit the patient on his left forearm and lower chest wall about a hour PTA.  Pt sustained a large bruise and deep abrasions to left forearm, swelling which has improved, and mild left lower chest wall contusion.  Pt is not c/o severe pain to the affected areas, no active bleeding, moving left forearm and wrist well, no chest pain or SOB.     History     Chief Complaint   Patient presents with   . Laceration   . Arm Injury     Patient is a 73 y.o. male presenting with skin laceration. The history is provided by the patient and the spouse.   Laceration  Location:  Shoulder/arm  Shoulder/arm laceration location:  L forearm  Length (cm):  3  Depth:  Cutaneous  Quality: jagged    Bleeding: controlled    Pain details:     Quality:  Aching    Severity:  Moderate    Progression:  Improving  Foreign body present:  No foreign bodies  Worsened by:  Nothing tried  Tetanus status:  Up to date      Past Medical History   Diagnosis Date   . Hyperlipidemia    . Sleep apnea      documented w/ CPAP   . Restless leg syndrome        Past Surgical History   Procedure Laterality Date   . Left leg fracture  2012   . Colonoscopy     . Repair torn meniscus  2012     left       No family history on file.    Social  History   Substance Use Topics   . Smoking status: Never Smoker    . Smokeless tobacco: Not on file   . Alcohol Use: 4.2 oz/week     7 Glasses of wine per week       .     No Known Allergies    Discharge Medication List as of 03/30/2014 10:45 PM      CONTINUE these medications which have NOT CHANGED    Details   pravastatin (PRAVACHOL) 10 MG tablet Take 10 mg by mouth every evening., Until Discontinued, Historical Med      ROPINIRole HCl (REQUIP PO) Take 1 tablet by mouth daily., Until Discontinued, Historical Med      traZODone (DESYREL) 150 MG tablet Take  150 mg by mouth nightly., Until Discontinued, Historical Med              Review of Systems   Constitutional: Negative for fever, chills, activity change and fatigue.   HENT: Negative for congestion, rhinorrhea, sore throat and trouble swallowing.    Eyes: Negative for visual disturbance.   Respiratory: Negative for cough and shortness of breath.    Cardiovascular: Negative for chest pain.   Gastrointestinal: Negative for nausea, vomiting and abdominal pain.   Genitourinary: Negative for dysuria and difficulty urinating.   Musculoskeletal: Negative for back pain, neck pain and neck stiffness.   Skin: Positive for wound. Negative for color change and rash.   Neurological: Negative for dizziness, weakness and headaches.   Psychiatric/Behavioral: Negative for behavioral problems and confusion.       Physical Exam    BP: 152/81 mmHg, Heart Rate: 84, Temp:  97.7 F (36.5 C), Resp Rate: 18, SpO2: 98 %, Weight: 83.462 kg    Physical Exam   Constitutional: He is oriented to person, place, and time. He appears well-developed and well-nourished. No distress.   HENT:   Head: Normocephalic and atraumatic.   Mouth/Throat: Oropharynx is clear and moist.   Eyes: Conjunctivae and EOM are normal. No scleral icterus.   Neck: Normal range of motion. Neck supple.   Pulmonary/Chest: Effort normal. No respiratory distress. He exhibits tenderness. He exhibits no edema, no deformity and no swelling.       Musculoskeletal: Normal range of motion. He exhibits edema and tenderness.        Left forearm: He exhibits tenderness, swelling and laceration. He exhibits no bony tenderness, no edema and no deformity.        Arms:  Neurological: He is alert and oriented to person, place, and time. No cranial nerve deficit.   Skin: Skin is warm and dry. No rash noted. He is not diaphoretic. No erythema.   Psychiatric: He has a normal mood and affect. His behavior is normal.       MDM and ED Course     ED Medication Orders    Start     Status Ordering  Provider    03/30/14 2145  HYDROcodone-acetaminophen (NORCO) 5-325 MG per tablet 1 tablet   Once     Route: Oral  Ordered Dose: 1 tablet     Last MAR action:  Given Yajayra Feldt           MDM      Procedures    Clinical Impression & Disposition     Clinical Impression  Final diagnoses:   Wrist sprain, left, initial encounter   Abrasion        ED Disposition    Discharge Adonus Uselman [facility] discharge to home/self care.    Condition at disposition: Stable             Discharge Medication List as of 03/30/2014 10:45 PM      START taking these medications    Details   HYDROcodone-acetaminophen (NORCO) 5-325 MG per tablet Take 1 tablet by mouth every 6 (six) hours as needed for Pain., Starting 03/30/2014, Until Discontinued, Print            Clinical Course / MDM     Working Differential (not completely inclusive):       Notes: FA skin avulsion and abrasion approximated with steri strips, covered with 4x4 gauze. X-rays of wrist neg for fracture, and pt is moving it well.       Discussion of abnormal results/incidental findings:         Data Review     Nursing records reviewed and agree: Yes    Laboratory results reviewed by ED provider: No    Radiologic study results reviewed by ED provider: Yes  Radiologic Studies Interpreted (viewed) by ED provider: Yes    Rendering Provider: Georga Hacking      Signout     Patient signed out to:      Signout notes:                             Georga Hacking, [place]  03/31/14 [phone]

## 2014-09-15 ENCOUNTER — Ambulatory Visit
Admission: RE | Admit: 2014-09-15 | Discharge: 2014-09-15 | Disposition: A | Discharge status: Home / Self Care | Payer: BC Managed Care – PPO | Source: Ambulatory Visit | Attending: Family Medicine | Admitting: Family Medicine

## 2014-09-15 ENCOUNTER — Other Ambulatory Visit: Payer: Self-pay | Admitting: Family Medicine

## 2014-09-15 DIAGNOSIS — R600 Localized edema: Secondary | ICD-10-CM

## 2014-10-03 DIAGNOSIS — H9390 Unspecified disorder of ear, unspecified ear: Secondary | ICD-10-CM

## 2014-10-03 DIAGNOSIS — J349 Unspecified disorder of nose and nasal sinuses: Secondary | ICD-10-CM

## 2014-10-03 DIAGNOSIS — I739 Peripheral vascular disease, unspecified: Secondary | ICD-10-CM

## 2014-10-03 DIAGNOSIS — J392 Other diseases of pharynx: Secondary | ICD-10-CM

## 2014-10-03 HISTORY — DX: Other diseases of pharynx: J39.2

## 2014-10-03 HISTORY — DX: Unspecified disorder of ear, unspecified ear: H93.90

## 2014-10-03 HISTORY — DX: Unspecified disorder of nose and nasal sinuses: J34.9

## 2014-10-03 HISTORY — DX: Peripheral vascular disease, unspecified: I73.9

## 2015-04-20 ENCOUNTER — Ambulatory Visit: Payer: BC Managed Care – PPO

## 2015-04-20 ENCOUNTER — Emergency Department: Payer: Medicare (Managed Care)

## 2015-04-20 ENCOUNTER — Emergency Department
Admission: EM | Admit: 2015-04-20 | Discharge: 2015-04-20 | Disposition: A | Discharge status: Home / Self Care | Payer: Medicare (Managed Care) | Attending: Emergency Medicine | Admitting: Emergency Medicine

## 2015-04-20 DIAGNOSIS — Z87891 Personal history of nicotine dependence: Secondary | ICD-10-CM | POA: Insufficient documentation

## 2015-04-20 DIAGNOSIS — T839XXA Unspecified complication of genitourinary prosthetic device, implant and graft, initial encounter: Secondary | ICD-10-CM

## 2015-04-20 DIAGNOSIS — R339 Retention of urine, unspecified: Secondary | ICD-10-CM | POA: Insufficient documentation

## 2015-04-20 DIAGNOSIS — N3001 Acute cystitis with hematuria: Secondary | ICD-10-CM

## 2015-04-20 DIAGNOSIS — T83098A Other mechanical complication of other indwelling urethral catheter, initial encounter: Secondary | ICD-10-CM | POA: Insufficient documentation

## 2015-04-20 LAB — URINALYSIS, REFLEX TO MICROSCOPIC EXAM IF INDICATED
Bilirubin, UA: NEGATIVE
Glucose, UA: NEGATIVE
Ketones UA: NEGATIVE
Nitrite, UA: NEGATIVE
Protein, UR: 300 — AB
Specific Gravity UA: 1.019 (ref 1.001–1.035)
Urine pH: 6.5 (ref 5.0–8.0)
Urobilinogen, UA: NORMAL mg/dL

## 2015-04-20 MED ORDER — OXYCODONE-ACETAMINOPHEN 5-325 MG PO TABS
1.0000 | ORAL_TABLET | Freq: Once | ORAL | Status: AC
Start: 2015-04-20 — End: 2015-04-20
  Administered 2015-04-20: 1 via ORAL
  Filled 2015-04-20: qty 1

## 2015-04-20 MED ORDER — CEFDINIR 300 MG PO CAPS
300.0000 mg | ORAL_CAPSULE | Freq: Once | ORAL | Status: AC
Start: 2015-04-20 — End: 2015-04-20
  Administered 2015-04-20: 300 mg via ORAL
  Filled 2015-04-20: qty 1

## 2015-04-20 MED ORDER — OXYCODONE-ACETAMINOPHEN 5-325 MG PO TABS
ORAL_TABLET | ORAL | Status: DC
Start: 2015-04-20 — End: 2016-03-21

## 2015-04-20 MED ORDER — CEFDINIR 300 MG PO CAPS
300.0000 mg | ORAL_CAPSULE | Freq: Two times a day (BID) | ORAL | Status: AC
Start: 2015-04-20 — End: 2015-04-27

## 2015-04-20 NOTE — ED Provider Notes (Signed)
Physician/Midlevel provider first contact with patient: 04/20/15 1925                          [facility] EMERGENCY DEPARTMENT RESIDENT H&P       CLINICAL INFORMATION        HPI:      Chief Complaint: Urinary Retention and Blocked Foley  .    [name] is a 74 y.o. male who presents with concern for urinary retention.  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 Allena Katz) felt like he has a bleeding lesion on his prostate from one of these, and so a foley was placed 1 week ago.  Today noticed increasing suprapubic pain, and blood and urine coming from around the foley.  Felt like he had decreased UOP, but over last hour has had 100 cc's of blood tinged urine.  Otherwise denies fevers, hcills, sob, cp.  He has a hx of constipation, on stool softener today had a BM. No vomiting.       History obtained from: patient         ROS:      Review of Systems   All other systems reviewed and are negative.        Physical Exam:      Pulse 83  BP    Resp 18  SpO2 97 %  Temp 98 F (36.7 C)    Physical Exam   Constitutional: He is oriented to person, place, and time. He appears well-developed and well-nourished. No distress.   Lying in bed comfortably   HENT:   Head: Normocephalic and atraumatic.   Eyes: EOM are normal. Right eye exhibits no discharge. Left eye exhibits no discharge.   Neck: Normal range of motion. Neck supple. No tracheal deviation present.   Cardiovascular: Normal rate, regular rhythm, normal heart sounds and intact distal pulses.  Exam reveals no gallop and no friction rub.    No murmur heard.  Pulmonary/Chest: Effort normal and breath sounds normal. He has no wheezes. He has no rales. He exhibits no tenderness.   Abdominal: Soft. Bowel sounds are normal. He exhibits no distension and no mass. There is tenderness. There is no rebound and no guarding.   TTP in the suprapubic area.     Genitourinary: Penis normal. No penile tenderness.   Penis shaft soft, foley in place, no blood at  meatus.  Foley bag with blood tinged urine.     Musculoskeletal: Normal range of motion. He exhibits no edema or tenderness.   Neurological: He is alert and oriented to person, place, and time.   No focal neuro deficit   Skin: Skin is warm. No rash noted. He is not diaphoretic. No erythema. No pallor.   Psychiatric: He has a normal mood and affect. His behavior is normal.               PAST HISTORY        Primary Care Provider: Donley Redder, MD        PMH/PSH:    .     Past Medical History   Diagnosis Date   . Hyperlipidemia    . Sleep apnea      documented w/ CPAP   . Restless leg syndrome    . Sciatica        He has past surgical history that includes Colonoscopy; Knee arthroscopy w/ meniscal repair (Left, 2012); Ankle fracture surgery (2013);  Transurethral resection of prostate; and Hernia repair.      Social/Family History:      He reports that he has quit smoking. He has never used smokeless tobacco. He reports that he drinks about 4.2 oz of alcohol per week. He reports that he does not use illicit drugs.    No family history on file.      Listed Medications on Arrival:    .     Home Medications             atorvastatin (LIPITOR) 10 MG tablet     Take 10 mg by mouth daily.     dutasteride (AVODART) 0.5 MG capsule     Take 0.5 mg by mouth daily.     finasteride (PROSCAR) 5 MG tablet     Take 5 mg by mouth daily.     gabapentin (NEURONTIN) 300 MG capsule     Take 300 mg by mouth 3 (three) times daily.     Rotigotine (NEUPRO TD)     Place onto the skin.                                                 Allergies: He has No Known Allergies.            VISIT INFORMATION        Reassessments/Clinical Course:    Pt in with concern for urinary retention.  Will place new foley, give pain meds, and see what the output is.  Signed out to Dr. Annia Friendly who will reassess and dispo accordingly.        Conversations with Other Providers:              Medications Given in the ED:    .     ED Medication Orders     Start Ordered      Status Ordering Provider    04/20/15 2139 04/20/15 2138  oxyCODONE-acetaminophen (PERCOCET) 5-325 MG per tablet 1 tablet   Once     Route: Oral  Ordered Dose: 1 tablet     Last MAR action:  Given Velda Shell C            Procedures:      Procedures      Assessment/Plan:                   Izetta Dakin, MD  Resident  04/20/15 2219

## 2015-04-20 NOTE — Discharge Instructions (Signed)
Foley Catheter Dysfunction    You have been seen for a problem with your catheter. The Foley catheter problem has been fixed.    In your case, the catheter was replaced.    Foley catheters are tubes put into the bladder. They are used to drain urine. They are used when there is a blockage at the base of the bladder, in the prostate or in the urethra. The urethra carries urine from the bladder to the outside. This blockage keeps the bladder from emptying normally. In these cases, the Foley catheter is usually used for a few days.    The catheter is attached to a bag. You will need to change the bag when it gets full. There are two sizes of bags: a leg bag, which is easier to wear under clothes, and a larger bag, which can be used overnight. The nurse will show you how to properly change the bag.    The catheter is put into the bladder under sterile conditions. However, an infection can still develop. The catheter can also get obstructed (blocked). Blockages can also happen because of blood clots in the bladder.    It is important to see a urologist within the next week.    YOU SHOULD SEEK MEDICAL ATTENTION IMMEDIATELY, EITHER HERE OR AT THE NEAREST EMERGENCY DEPARTMENT, IF ANY OF THE FOLLOWING OCCURS:   Fever (temperature higher than 100.4F / 38C), flank pain (side or back pain), nausea (sick to your stomach) or vomiting (throwing up).   You think the catheter has become blocked again. Some symptoms are: No urine draining into the bag, low abdominal (belly) pain, or urine leaking around the tube.   Any worsening symptoms or any other concerns.               Urinary Tract Infection    You have been diagnosed with a lower urinary tract infection (UTI). This is also called cystitis.    Cystitis is an infection in your bladder. Your doctor diagnosed it by testing your urine. Cystitis usually causes burning with urination or frequent urination. It might make you feel like you have to urinate even when you  don't.     Cystitis is usually treated with antibiotics and medicine to help with pain.    It is VERY IMPORTANT that you fill your prescription and take all of the antibiotics as directed. If a lower urinary tract infection goes untreated for too long, it can become a kidney infection.    FOR WOMEN: To reduce the risk of getting cystitis again:   Always urinate before and after sexual intercourse.   Always wipe from front to back after urinating or having a bowel movement. Do not wipe from back to front.   Drink plenty of fluids. Try to drink cranberry or blueberry juice. These juices have a chemical that stops bacteria from "sticking" to the bladder.    YOU SHOULD SEEK MEDICAL ATTENTION IMMEDIATELY, EITHER HERE OR AT THE NEAREST EMERGENCY DEPARTMENT, IF ANY OF THE FOLLOWING OCCURS:   You have a fever (temperature higher than 100.4F / 38C) or shaking chills.   You feel nauseated or vomit.   You have pain in your side or back.   You don't get better after taking all of your antibiotics.   You have any new symptoms or concerns.   You feel worse or do not improve.

## 2015-04-20 NOTE — ED Provider Notes (Signed)
Clinical Impression:   Final diagnoses:   Foley catheter problem, initial encounter   Acute cystitis with hematuria       Disposition:    ED Disposition     Discharge [name] [facility] discharge to home/self care.    Condition at disposition: Stable                        Attending Note:  Rennis Petty MD    The patient was seen and examined by the mid-level (physician's assistant or nurse practitioner), or resident/fellow, and the plan of care was discussed with me.  I have reviewed and agree with the history, physical exam, assessments, and/or procedures performed.    I have personally seen, evaluated, and examined this patient.    In brief: 74 y.o. male presents with bladder spasms and urinary retention since earlier tonight. Associated with hematuria. Tried flushing with no relief. Scheduled for cystoscopy in 3 days for prostate lesion. Foley in place.         Constitutional: Vital signs reviewed. Well appearing.  Head:  Normocephalic, atraumatic  Eyes: No conjunctival injection. No discharge.  ENT: No drooling, no trismus  Neck: supple, normal ROM  Abdomen: soft nt, no bladder distension (post new foley placement)  GU: NL appearing penis. No bleeding. No external lesions. Foley in place.  Respiratory/Chest: No respiratory distress, equal chest rise  Cardiovascular:  Normal Rate  Upper Extremity: normal ROM, no deformity  Lower Extremity: normal ROM, no deformity  Neurological: GCS 15. No focal motor deficits by observation. Speech normal.   Skin: Warm and dry. No rash.   Psychiatric: Normal affect. Normal concentration.    Differential: urinary retention, UTI, clogged foley      Plan:   Have RN replace foley and irrigate.   Check UA.       11:00 PM - UA c/w UTI. Will treat with omnicef and follow up with urology.          ________________________________________________________________________    Monitor/Rhythm Strip: Sinus rhythm, rate 70.         The vital signs and pulse ox were reviewed.     Pulse 83  Temp(Src) 98 F (36.7 C)  Resp 18  Ht 6\' 1"  (1.854 m)  Wt 95.255 kg  BMI 27.71 kg/m2  SpO2 97%    Normal oxygen sat with no intervention needed.           ________________________________________________________________________    Amount and/or Complexity of Data Reviewed   First MD: Joyice Faster. Kathi Ludwig, MD   I am the first provider for this patient.      The past medical/surgical history, social history, family history, previous records, and medication list were reviewed by the ED attending (myself).    Clinical lab tests ordered and reviewed by myself: yes   All lab results interpreted by myself: yes   All radiology ordered and reviewed by myself: yes   All radiologic studies interpreted  by myself: yes   Independent visualization of images, tracings, or specimens: yes   Discuss the patient with other providers: yes   Decide to obtain previous medical records or to obtain history from someone other than the patient: yes   Review and summarize past medical records: yes   Nursing notes reviewed: yes       ________________________________________________________________________    Clinical Information:    PMH:   Past Medical History   Diagnosis Date   . Hyperlipidemia    .  Sleep apnea      documented w/ CPAP   . Restless leg syndrome    . Sciatica          PSH:   Past Surgical History   Procedure Laterality Date   . Colonoscopy     . Knee arthroscopy w/ meniscal repair Left 2012   . Ankle fracture surgery  2013   . Transurethral resection of prostate     . Hernia repair           FH:   No family history on file.    SH:  History     Social History   . Marital Status: Married     Spouse Name: N/A   . Number of Children: N/A   . Years of Education: N/A     Occupational History   . Not on file.     Social History Main Topics   . Smoking status: Former Games developer   . Smokeless tobacco: Never Used   . Alcohol Use: 4.2 oz/week     7 Glasses of wine per week   . Drug Use: No   . Sexual Activity: Not on file      Other Topics Concern   . Not on file     Social History Narrative       ALL:   No Known Allergies    Home Meds:  Current/Home Medications    ATORVASTATIN (LIPITOR) 10 MG TABLET    Take 10 mg by mouth daily.    DUTASTERIDE (AVODART) 0.5 MG CAPSULE    Take 0.5 mg by mouth daily.    FINASTERIDE (PROSCAR) 5 MG TABLET    Take 5 mg by mouth daily.    GABAPENTIN (NEURONTIN) 300 MG CAPSULE    Take 300 mg by mouth 3 (three) times daily.    ROTIGOTINE (NEUPRO TD)    Place onto the skin.         ROS: Positive and negative ROS elements as per HPI.  All Other Systems Reviewed and Negative: Yes    ________________________________________________________________________    Attestations:    I was acting as a scribe for Rennis Petty, MD on [facility]  Treatment Team: Scribe: Particia Lather    I am the first provider for this patient and I personally performed the services documented.  Treatment Team: Scribe: Particia Lather is scribing for me on [facility]. This note accurately reflects work and decisions made by me.  Rennis Petty, MD        Lenny Pastel, MD  04/21/15 [phone]

## 2015-04-20 NOTE — ED Provider Notes (Signed)
Received signout from Dr. Enzo Montgomery at 2200 pending UA results and foley replacement. Pt feeling improved with new foley. UA c/w infection and will give omnicef. Pt will f/u with urologist on Thursday.    Whitman Hero, MD  Resident  04/20/15 [phone]

## 2015-04-20 NOTE — Pre-Procedure Instructions (Addendum)
   Fax request sent for labs, EKG and H&P/medical clearance to be done today 04/20/15 @ preop appt w/ PMD Dr. Adriana Mccallum   Cardiology LOV in Epic from 2014 with Dr. Dareen Piano

## 2015-04-20 NOTE — ED Notes (Signed)
Patient presents to ED c/o of urinary retention that started appx 1 hour ago when he noticed a decreased urine output in his foley. Patient c/o of bladder spasms. Denies any other complaints.

## 2015-04-22 NOTE — Pre-Procedure Instructions (Signed)
04-20-2015 urinalysis and preliminary culture report faxed to Dr. Allena Katz.  Winnie notified of 04-20-2015 ED visit and that report is "preliminary".

## 2015-04-23 ENCOUNTER — Ambulatory Visit: Payer: Medicare (Managed Care) | Admitting: Anesthesiology

## 2015-04-23 ENCOUNTER — Ambulatory Visit: Payer: Self-pay

## 2015-04-23 ENCOUNTER — Encounter
Admission: RE | Disposition: A | Discharge status: Home / Self Care | Payer: Self-pay | Source: Ambulatory Visit | Attending: Urology

## 2015-04-23 ENCOUNTER — Ambulatory Visit
Admission: RE | Admit: 2015-04-23 | Discharge: 2015-04-23 | Disposition: A | Discharge status: Home / Self Care | Payer: Medicare (Managed Care) | Source: Ambulatory Visit | Attending: Urology | Admitting: Urology

## 2015-04-23 ENCOUNTER — Ambulatory Visit: Payer: Medicare (Managed Care) | Admitting: Urology

## 2015-04-23 DIAGNOSIS — E785 Hyperlipidemia, unspecified: Secondary | ICD-10-CM | POA: Insufficient documentation

## 2015-04-23 DIAGNOSIS — G4730 Sleep apnea, unspecified: Secondary | ICD-10-CM | POA: Insufficient documentation

## 2015-04-23 DIAGNOSIS — R338 Other retention of urine: Secondary | ICD-10-CM | POA: Insufficient documentation

## 2015-04-23 DIAGNOSIS — N401 Enlarged prostate with lower urinary tract symptoms: Secondary | ICD-10-CM

## 2015-04-23 DIAGNOSIS — G2581 Restless legs syndrome: Secondary | ICD-10-CM | POA: Insufficient documentation

## 2015-04-23 DIAGNOSIS — N138 Other obstructive and reflux uropathy: Secondary | ICD-10-CM | POA: Insufficient documentation

## 2015-04-23 DIAGNOSIS — R310 Gross hematuria: Secondary | ICD-10-CM | POA: Insufficient documentation

## 2015-04-23 HISTORY — PX: CYSTOSCOPY, TURP: SHX3621

## 2015-04-23 HISTORY — DX: Sciatica, unspecified side: M54.30

## 2015-04-23 SURGERY — CYSTOSCOPY, WITH TRANSURETHRAL RESECTION PROSTATE (TURP)
Anesthesia: Anesthesia General | Site: Pelvis | Wound class: Clean Contaminated

## 2015-04-23 MED ORDER — CEFAZOLIN SODIUM-DEXTROSE 2-3 GM-% IV SOLR
INTRAVENOUS | Status: DC
Start: 2015-04-23 — End: 2015-04-23
  Filled 2015-04-23: qty 50

## 2015-04-23 MED ORDER — LIDOCAINE HCL 2 % IJ SOLN
INTRAMUSCULAR | Status: DC | PRN
Start: 2015-04-23 — End: 2015-04-23
  Administered 2015-04-23: 80 mg

## 2015-04-23 MED ORDER — PROMETHAZINE HCL 25 MG/ML IJ SOLN
6.2500 mg | Freq: Once | INTRAMUSCULAR | Status: DC | PRN
Start: 2015-04-23 — End: 2015-04-23

## 2015-04-23 MED ORDER — PHENYLEPHRINE 100 MCG/ML IN NACL 0.9% IV SOSY
PREFILLED_SYRINGE | INTRAVENOUS | Status: AC
Start: 2015-04-23 — End: ?
  Filled 2015-04-23: qty 5

## 2015-04-23 MED ORDER — LIDOCAINE HCL (PF) 2 % IJ SOLN
INTRAMUSCULAR | Status: AC
Start: 2015-04-23 — End: ?
  Filled 2015-04-23: qty 5

## 2015-04-23 MED ORDER — ONDANSETRON HCL 4 MG/2ML IJ SOLN
INTRAMUSCULAR | Status: DC | PRN
Start: 2015-04-23 — End: 2015-04-23
  Administered 2015-04-23: 4 mg via INTRAVENOUS

## 2015-04-23 MED ORDER — GLYCOPYRROLATE 0.2 MG/ML IJ SOLN
INTRAMUSCULAR | Status: DC | PRN
Start: 2015-04-23 — End: 2015-04-23
  Administered 2015-04-23 (×2): 0.1 mg via INTRAVENOUS

## 2015-04-23 MED ORDER — OXYCODONE-ACETAMINOPHEN 5-325 MG PO TABS
ORAL_TABLET | ORAL | Status: AC
Start: 2015-04-23 — End: 2015-04-23
  Administered 2015-04-23: 1 via ORAL
  Filled 2015-04-23: qty 1

## 2015-04-23 MED ORDER — PROPOFOL 10 MG/ML IV EMUL (WRAP)
INTRAVENOUS | Status: AC
Start: 2015-04-23 — End: ?
  Filled 2015-04-23: qty 20

## 2015-04-23 MED ORDER — LACTATED RINGERS IV SOLN
75.0000 mL/h | INTRAVENOUS | Status: DC
Start: 2015-04-23 — End: 2015-04-23

## 2015-04-23 MED ORDER — FENTANYL CITRATE (PF) 50 MCG/ML IJ SOLN (WRAP)
INTRAMUSCULAR | Status: DC | PRN
Start: 2015-04-23 — End: 2015-04-23
  Administered 2015-04-23: 50 ug via INTRAVENOUS
  Administered 2015-04-23 (×2): 25 ug via INTRAVENOUS

## 2015-04-23 MED ORDER — LACTATED RINGERS IV SOLN
INTRAVENOUS | Status: DC
Start: 2015-04-23 — End: 2015-04-23
  Administered 2015-04-23: 1000 mL via INTRAVENOUS

## 2015-04-23 MED ORDER — GLYCINE 1.5 % IR SOLN
Status: DC | PRN
Start: 2015-04-23 — End: 2015-04-23
  Administered 2015-04-23: 3000 mL
  Administered 2015-04-23: 6000 mL
  Administered 2015-04-23: 3000 mL

## 2015-04-23 MED ORDER — FENTANYL CITRATE (PF) 50 MCG/ML IJ SOLN (WRAP)
25.0000 ug | INTRAMUSCULAR | Status: AC | PRN
Start: 2015-04-23 — End: 2015-04-23
  Administered 2015-04-23 (×3): 25 ug via INTRAVENOUS

## 2015-04-23 MED ORDER — FENTANYL CITRATE (PF) 50 MCG/ML IJ SOLN (WRAP)
INTRAMUSCULAR | Status: AC
Start: 2015-04-23 — End: 2015-04-23
  Administered 2015-04-23: 25 ug via INTRAVENOUS
  Filled 2015-04-23: qty 2

## 2015-04-23 MED ORDER — PROPOFOL INFUSION 10 MG/ML
INTRAVENOUS | Status: DC | PRN
Start: 2015-04-23 — End: 2015-04-23
  Administered 2015-04-23: 200 mg via INTRAVENOUS
  Administered 2015-04-23: 20 mg via INTRAVENOUS
  Administered 2015-04-23: 30 mg via INTRAVENOUS

## 2015-04-23 MED ORDER — ONDANSETRON HCL 4 MG/2ML IJ SOLN
INTRAMUSCULAR | Status: AC
Start: 2015-04-23 — End: ?
  Filled 2015-04-23: qty 2

## 2015-04-23 MED ORDER — GLYCOPYRROLATE 0.2 MG/ML IJ SOLN
INTRAMUSCULAR | Status: AC
Start: 2015-04-23 — End: ?
  Filled 2015-04-23: qty 1

## 2015-04-23 MED ORDER — ONDANSETRON HCL 4 MG/2ML IJ SOLN
4.0000 mg | Freq: Once | INTRAMUSCULAR | Status: DC | PRN
Start: 2015-04-23 — End: 2015-04-23

## 2015-04-23 MED ORDER — HYDROMORPHONE HCL 1 MG/ML IJ SOLN
0.5000 mg | INTRAMUSCULAR | Status: DC | PRN
Start: 2015-04-23 — End: 2015-04-23

## 2015-04-23 MED ORDER — CEFAZOLIN SODIUM-DEXTROSE 2-3 GM-% IV SOLR
2.0000 g | Freq: Once | INTRAVENOUS | Status: AC
Start: 2015-04-23 — End: 2015-04-23
  Administered 2015-04-23: 2 g via INTRAVENOUS

## 2015-04-23 MED ORDER — MIDAZOLAM HCL 2 MG/2ML IJ SOLN
INTRAMUSCULAR | Status: DC | PRN
Start: 2015-04-23 — End: 2015-04-23
  Administered 2015-04-23 (×2): 1 mg via INTRAVENOUS

## 2015-04-23 MED ORDER — PHENYLEPHRINE HCL 10 MG/ML IV SOLN (WRAP)
Status: DC | PRN
Start: 2015-04-23 — End: 2015-04-23
  Administered 2015-04-23 (×3): 50 ug via INTRAVENOUS

## 2015-04-23 MED ORDER — MIDAZOLAM HCL 2 MG/2ML IJ SOLN
INTRAMUSCULAR | Status: AC
Start: 2015-04-23 — End: ?
  Filled 2015-04-23: qty 2

## 2015-04-23 MED ORDER — PHENAZOPYRIDINE HCL 200 MG PO TABS
200.0000 mg | ORAL_TABLET | Freq: Three times a day (TID) | ORAL | Status: AC | PRN
Start: 2015-04-23 — End: 2015-04-26

## 2015-04-23 MED ORDER — FENTANYL CITRATE (PF) 50 MCG/ML IJ SOLN (WRAP)
INTRAMUSCULAR | Status: AC
Start: 2015-04-23 — End: ?
  Filled 2015-04-23: qty 2

## 2015-04-23 MED ORDER — OXYCODONE-ACETAMINOPHEN 5-325 MG PO TABS
1.0000 | ORAL_TABLET | Freq: Once | ORAL | Status: AC | PRN
Start: 2015-04-23 — End: 2015-04-23

## 2015-04-23 MED ORDER — MEPERIDINE HCL 25 MG/ML IJ SOLN
12.5000 mg | Freq: Once | INTRAMUSCULAR | Status: DC | PRN
Start: 2015-04-23 — End: 2015-04-23

## 2015-04-23 SURGICAL SUPPLY — 24 items
BAG DRAINAGE TABLE FLEXIBLE HOSE OEC (Procedure Accessories) ×1
BAG DRAINAGE UROVIEW 2600 TABLE FLEXIBLE HOSE OEC UROVIEW 2600 2800 (Procedure Accessories) ×1 IMPLANT
BAG DRN LF STRL TBL FLXB HOSE DISP OEC (Procedure Accessories) ×1
CATH BDX IC URTH BACTI-GRD RBR DDRGL 22 (Catheter Urine) ×1
CATHETER URETHRAL OD22 FR 30 CC FOLEY 3 (Catheter Urine) ×1
CATHETER URETHRAL OD22 FR FOLEY 3 WAY 2 OPPOSE DRAINAGE EYE MEDIUM (Catheter Urine) ×1 IMPLANT
CONTAINER SPEC 8OZ NS SNPON LID TRNLU (Suction) ×2 IMPLANT
GLOVE SURG BIOGEL PF LTX SZ7.0 (Glove) ×2 IMPLANT
GOWN OPTIMA STRL BACK OR (Gown) ×4 IMPLANT
PAD ELECTROSRG GRND REM W CRD (Procedure Accessories) ×2 IMPLANT
SET BLADDER EVACUATION W/ADAPT (Urology Supply) ×2 IMPLANT
SET IRR 300- MMHG LVL 1 NORMOFLO LF STRL (Urology Supply) ×1
SET IRRIGATION 300- MMHG AUTOMATIC GAS (Urology Supply) ×1
SET IRRIGATION 300- MMHG AUTOMATIC GAS VENT 2 SPIKE SETUP (Urology Supply) ×1 IMPLANT
SLEEVE CMPR MED KN LGTH KDL SCD 21- IN (Sleeve) ×2
SLEEVE COMPRESSION MEDIUM KNEE LENGTH KENDALL SEQUENTIAL OD21- IN (Sleeve) ×1 IMPLANT
SOL BETADINE SOLUTION 4 OZ (Prep) ×2 IMPLANT
SOL NACL 0.9% IRR 3000CC (Irrigation Solutions) ×6
SOLUTION IRR 0.9% NACL 3L URMTC LF PLS (Irrigation Solutions) ×6
SOLUTION IRRIGATION 0.9% SODIUM CHLORIDE 3000 ML PLASTIC CONTAINER (Irrigation Solutions) ×6 IMPLANT
TRAY CYSTOSCOPY PACK (Pack) ×2 IMPLANT
WATER STERILE PLASTIC POUR BOTTLE 1000 (Irrigation Solutions) ×3
WATER STERILE PLASTIC POUR BOTTLE 1000 ML (Irrigation Solutions) ×3 IMPLANT
WATER STRL 1000ML PLS PR BTL LF (Irrigation Solutions) ×3

## 2015-04-23 NOTE — Transfer of Care (Signed)
Anesthesia Transfer of Care Note    Patient: [name] [facility]    Procedures performed: Procedure(s) with comments:  CYSTOSCOPY, TURP, BLADDER BIOPSY - CYSTOSCOPY, CLOT EVACUATION,  TURP, BLADDER BIOPSY w/FULGERATION    Anesthesia type: General LMA    Patient location:Phase I PACU    Last vitals:   Filed Vitals:    04/23/15 1351   BP: 140/84   Pulse: 68   Temp: 36.4 C (97.5 F)   Resp: 16   SpO2: 100%       Post pain: Patient not complaining of pain, continue current therapy      Mental Status:awake and alert     Respiratory Function: tolerating face mask    Cardiovascular: stable    Nausea/Vomiting: patient not complaining of nausea or vomiting    Hydration Status: adequate    Post assessment: no apparent anesthetic complications and no reportable events

## 2015-04-23 NOTE — Op Note (Signed)
Procedure Date: 04/23/2015     Patient Type: A     SURGEON: Jobe Gibbon MD  ASSISTANT:       PREOPERATIVE DIAGNOSES:  1.  Benign prostatic hypertrophy with obstruction.  2.  Clot urinary retention.  3.  Gross hematuria.     POSTOPERATIVE DIAGNOSES  1.  Benign prostatic hypertrophy with obstruction.  2.  Clot urinary retention.  3.  Gross hematuria.     TITLE OF PROCEDURE:  1.  Cystoscopy.  2.  Transurethral resection of prostate.  3.  Cystoscopy with clot evacuation.  4.  Bladder biopsy.     ANESTHESIA:  General.     INDICATIONS:  [name] is a 74 year old male who presented with gross hematuria and  clot urinary retention.  He had a Foley catheter placed and the bladder was  copiously irrigated.  He bled intermittently since the initial episode.  He was  counseled regarding options for further management.  After reviewing the  risks and benefits and answering all of his questions, he wished to proceed  with a cystoscopy, clot evacuation, fulguration of bleeder, possible  transurethral resection of the prostate, possible bladder biopsy, and  insertion of Foley catheter.     DESCRIPTION OF PROCEDURE:  The patient was initially met in the preoperative holding area.  Informed  consent was obtained.  Antibiotics were administered.  Sequential devices  were placed on lower extremity.  He was then brought to the operating room  and placed in lithotomy position.  Appropriate padding was placed to all  pressure points.  Once comfort was expressed, anesthesia was administered.   The Foley catheter was then removed.  The entire genitalia and groin was  then prepped and draped in standard surgical fashion.     A 26-French resectoscope sheath was inserted per urethra and into the  bladder under direct visualization.  The meatus and fossa navicularis had a  stricture and it was dilated prior to insertion.     The bladder was then irrigated and the clots were removed.  There was no  evidence of any active bleeding at this  point.  The bladder was then  carefully evaluated.  Both ureteral orifices were in normal anatomical  position.  The posterior wall of the bladder had erythematous, edematous  patches consistent with a Foley that had been in place for several weeks.   There were no other lesions in the bladder.  There were no stones.     The prostatic urethra was then carefully evaluated.  The median portion of  the prostate had very engorged blood vessels and it was friable.  In  addition, the right side of the prostate had a large area of growth.  The  mucosa overlying this right lateral lobe was extremely friable and bled  very easily.     A resectoscope was then used to resect this right lateral lobe and median  portion of the prostate.  The prostatic urethra at this point was wide  open.  Bleeding sites fulgurated.     The bladder was then reirrigated and the prostatic chips were removed and  sent to pathology for further evaluation.  The bladder was reevaluated.   Both ureteral orifices were in normal anatomical position, and with no  injury.  There was no injury to the bladder.  There was no evidence of any  active bleeding in the bladder.  The prostatic urethra was reevaluated.   Again, the channel was now wide open.  There was no evidence of any active  bleeding.     The resectoscope was then used to perform a bladder biopsy of the  erythematous and edematous area on the posterior wall of the bladder to  confirm this is secondary to the Foley that was placed.  The biopsy  specimen was sent to pathology for evaluation.  The biopsy site was  fulgurated.  There was no evidence of any active bleeding from the biopsy  site at this point.  The bladder was reirrigated numerous times and the  prostatic urethra and bladder were reevaluated numerous times.  There was  no evidence of any active bleeding at this point.  The resectoscope was  then removed under direct visualization.     A 20-French 3-way Foley catheter was inserted  without any difficulty and  placed to gravity drainage.  The fluid was clear, water-like.  The  irrigation port was capped.     The patient tolerated the procedure well.  He was extubated in the OR.  He  was then brought to the recovery room in stable condition.           D:  04/23/2015 14:11 PM by Dr. Jerilynn Birkenhead. Allena Katz, MD ([id number])  T:  04/23/2015 14:37 PM by       Everlean Cherry: [id number]) (Doc ID: [id number])

## 2015-04-23 NOTE — Anesthesia Postprocedure Evaluation (Signed)
Anesthesia Post Evaluation    Patient: [name] [facility]    Procedure(s) with comments:  CYSTOSCOPY, TURP, BLADDER BIOPSY - CYSTOSCOPY, CLOT EVACUATION,  TURP, BLADDER BIOPSY w/FULGERATION    Anesthesia type: general    Last Vitals:   Filed Vitals:    04/23/15 1420   BP: 145/84   Pulse: 61   Temp:    Resp: 12   SpO2: 97%       Patient Location: Phase II PACU      Post Pain: Patient not complaining of pain, continue current therapy    Mental Status: awake and alert    Respiratory Function: tolerating room air    Cardiovascular: stable    Nausea/Vomiting: patient not complaining of nausea or vomiting    Hydration Status: adequate    Post Assessment: no apparent anesthetic complications, no evidence of recall and no reportable events          Anesthesia Qualified Clinical Data Registry    Central Line      CVC insertion : NO                                               Perioperative temperature management      General/neuraxial anesthesia > or = 60 minutes (excluding CABG) : YES              > Use of intraoperative active warming : YES              > Temperature > or = 36 degrees Centigrade (96.8 degrees Farenheit) during time span from 30 minutes before up to 15 minutes after anesthesia end time : YES      Administration of antibiotic prophylaxis      Age > or = 18, with IV access, with surgical procedure for which antibiotic prophylaxis indicated, and not on chronic antibiotics : YES              > Prophylactic antibiotics within 1 hour of incision (or fluroroquinolone/vancomycin within 2 hours of incision) : YES    Medication Administration      Ordering or administration of drug inconsistent with intended drug, dose, delivery or timing : NO      Dental loss/damage      Dental injury with administration of anesthesia : NO      Difficult intubation due to unrecognized difficult airway        Elective airway procedure including but not limited to: tracheostomy, fiberoptic bronchoscopy, rigid bronchoscopy; jet  ventilation; or elective use of a device to facilitate airway management such as a Glidescope : NO                > Unanticipated difficult intubation post pre-evaluation : NO      Aspiration of gastric contents        Aspiration of gastric contents : NO                    Surgical fire        Procedure requiring electrocautery/laser : YES                > Ignition/burning in invasive procedure location : NO      Immediate perioperative cardiac arrest        Cardiac arrest in OR or PACU : NO  Unplanned hospital admission        Unplanned hospital admission for initially intended outpatient anesthesia service : NO      Unplanned ICU admission        Unplanned ICU admission related to anesthesia occurring within 24 hours of induction or start of MAC : NO      Surgical case cancellation        Cancellation of procedure after care already started by anesthesia care team : NO      Post-anesthesia transfer of care checklist/protocol to PACU        Transfer from OR to PACU upon case conclusion : YES              > Use of PACU transfer checklist/protocol : YES     (Includes the key elements of: patient identification, responsible practitioner identification (PACU nurse or advanced practitioner), discussion of pertinent history and procedure course, intraoperative anesthetic management, post-procedure plans, acknowledgement/questions)    Post-anesthesia transfer of care checklist/protocol to ICU        Transfer from OR to ICU upon case conclusion : NO                    Post-operative nausea/vomiting risk protocol        Post-operative nausea/vomiting risk protocol : YES  Patient > or = 18 with care initiated by anesthesia team that has a risk factor screen for post-op nausea/vomiting (Includes male, hx PONV, or motion sickness, non-smoker, intended opioid administration for post-op analgesia.)    Anaphylaxis        Anaphylaxis during anesthesia services : NO    (Inclusive of any suspected transfusion  reaction in association with blood-bank confirmed blood product incompatibility)              Dyanne Carrel, 04/23/2015 2:43 PM

## 2015-04-23 NOTE — Discharge Instructions (Signed)
Post Anesthesia Discharge Instructions    Although you may be awake and alert in the recovery room, small amounts of anesthetic remain in your system for about 24 hours.  You may feel tired and sleepy during this time.      You are advised to go directly home from the hospital.    Plan to stay at home and rest for the remainder of the day.    It is advisable to have someone with you at home for 24 hours after surgery.    Do not operate a motor vehicle, or any mechanical or electrical equipment for the next 24 hours.      Be careful when you are walking around, you may become dizzy.  The effects of anesthesia and/or medications are still present and drowsiness may occur    Do not consume alcohol, tranquilizers, sleeping medications, or any other non prescribed medication for the remainder of the day.    Diet:  begin with liquids, progress your diet as tolerated or as directed by your surgeon.  Nausea and vomiting may occur in the next 24 hours.        Cystoscopy Discharge Instructions    GENERAL INFORMATION:   . Do not drive a car or operate machinery for 24 hours.  . Do not consume alcohol, tranquilizers, sleeping medications, or any non-prescribed medications for 24 hours unless approved by your physician.    . Do not make important decisions or sign any important papers in the next 24 hours.   . Please have a responsible person with you tonight.     ACTIVITY:   . Rest for 24 hours.  . No restrictions, resume your normal activity.     TREATMENT:  . You may have burning with urination. This is normal. To alleviate discomfort, drink plenty of fluids today and tomorrow.   . If it is difficult to start urinating, sit in a tub of warm water to urinate.   . The doctor has dilated your urethra. You will have a small amount of bleeding. It should stop within 24-28 hours.     MEDICATIONS:  . No aspirin or aspirin-containing products until advised by your physician.  . Use pain medication as directed by your physician.      DIET:   . Resume your normal diet.  . Begin with clear liquids, progress to normal diet.      NOTIFY PHYSICIAN IF:  . Persistent nausea or vomiting.  . Chills, fever (above 101 degrees F).   . Increased bleeding.  . Unable to urinate in 6-8 hours and abdomen is distended.       Foley Catheter Care    A Foley catheter is a rubber tube that is placed through the urethra (opening where urine comes out) and into the bladder. This helps drain urine from the bladder. There is a small balloon on the end of the tube that is inflated after insertion. This keeps the catheter from sliding out of the bladder.  A Foley catheter is used to treat urinary retention (unable to pass urine). It is also used when there is incontinence (loss of bladder control).  Home Care:   Doreatha Martin taking any prescribed antibiotic even if you are feeling better before then.   It is important to keep bacteria from getting into the collection bag. Do not disconnect the catheter from the collection bag.   Use a leg band to secure the drainage tube, so it does not pull on the  catheter. Drain the collection bag when it becomes full using the drain spout at the bottom of the bag.   Do not try to pull or remove your catheter. This will injure your urethra. It must be removed by a doctor or nurse.  Follow Up  with your doctor, or as advised, for repeat urine testing and catheter removal or replacement.  Get Prompt Medical Attention  if any of the following occur:   Fever of 100.63F (38C) or higher, or as directed by your healthcare provider   Bladder pain or fullness   Abdominal swelling, nausea or vomiting or back pain   Blood or urine leakage around the catheter   Bloody urine coming from the catheter (if a new symptom)   Catheter falls out   Catheter stops draining for 6 hours   Weakness, dizziness or fainting   2000-2015 [employer] [address] [place], [place] [address]. All rights reserved. This information is not  intended as a substitute for professional medical care. Always follow your healthcare professional's instructions.        Jobe Gibbon, MD 04/23/2015

## 2015-04-23 NOTE — Anesthesia Preprocedure Evaluation (Signed)
Anesthesia Evaluation    AIRWAY    Mallampati: II    Neck ROM: full  Mouth Opening:full   CARDIOVASCULAR    cardiovascular exam normal       DENTAL         PULMONARY    pulmonary exam normal     OTHER FINDINGS                      Anesthesia Plan    ASA 2     general                                 informed consent obtained

## 2015-04-23 NOTE — PACU (Signed)
Patient returned to baseline VSS and alert status. Patient walking and tolerating PO. Patient denies nausea and pain manageable. Discharge instructions and 3 prescriptions given to wife and reviewed with both patient and wife. Both verbalize understanding. Patient discharge via wheelchair to home. [name]

## 2015-04-23 NOTE — H&P (Signed)
[name] is an 74 y.o. male presents with gross hematuria and clot urinary retention.    Past Medical History   Diagnosis Date   . Hyperlipidemia    . Sleep apnea      documented w/ CPAP   . Restless leg syndrome    . Sciatica        Allergies: No Known Allergies    Active Problems:    Gross hematuria    Blood pressure 136/80, pulse 66, temperature 95.7 F (35.4 C), temperature source Temporal Artery, resp. rate 16, height 1.854 m (6\' 1" ), weight 95.255 kg (210 lb), SpO2 96 %.    Review of Systems   All other systems reviewed and are negative.      Physical Exam   Constitutional: He is oriented to person, place, and time. He appears well-developed and well-nourished.   Pulmonary/Chest: Effort normal and breath sounds normal.   Abdominal: Soft.   Neurological: He is alert and oriented to person, place, and time.       Assessment:  Gross hematuria.  Clot urinary retention.  BPH    Plan:  Informed conset obtained for cystoscopy, clot evacuation, fulguration of bleeder, possible transurethral resection of prostate, possible biopsy, and insertion of foley catheter.  Risks, benefits, and alternatives reviewed.  Answered all questions.     [name]  04/23/2015

## 2015-04-23 NOTE — Brief Op Note (Signed)
BRIEF OP NOTE    Date Time: 04/23/2015 2:02 PM    Patient Name:   [name] [facility]    Date of Operation:   04/23/2015    Providers Performing:   Surgeon(s):  Jobe Gibbon, MD    Assistant (s):   Circulator: Frazier Butt, RN  Scrub Person: Ernestina Columbia B    Operative Procedure:   Procedure(s):  CYSTOSCOPY, TURP, BLADDER BIOPSY, CLOT EVACUATION.    Preoperative Diagnosis:   Pre-Op Diagnosis Codes:     * Gross hematuria [R31.0]     * Acute urinary retention [R33.8]     * Enlarged prostate with lower urinary tract symptoms [N40.1]    Postoperative Diagnosis:   Post-Op Diagnosis Codes:     * Gross hematuria [R31.0]     * Acute urinary retention [R33.8]     * Enlarged prostate with lower urinary tract symptoms [N40.1]    Anesthesia:   General    Estimated Blood Loss:    2 mL    Implants:   * No implants in log *    Drains:       Specimens:        SPECIMENS (last 24 hours)      Pathology Specimens       04/23/15 1300             Specimen Information    Specimen Testing Required Routine Pathology       Specimen ID  A       Specimen Description PROSTATE CHIPS       Specimen Information    Specimen Testing Required Routine Pathology       Specimen ID  B       Specimen Description BLADDER BIOPSY           Findings:   Bleeding prostate.  Erythematous mucosa posterior wall of bladder.    Complications:   None      Signed by: Jobe Gibbon, MD                                                                           [facility] OR

## 2015-04-24 ENCOUNTER — Encounter: Payer: Self-pay | Admitting: Urology

## 2015-04-28 LAB — LAB USE ONLY - HISTORICAL SURGICAL PATHOLOGY

## 2015-05-05 ENCOUNTER — Other Ambulatory Visit: Payer: Self-pay | Admitting: Family Medicine

## 2015-05-05 DIAGNOSIS — M5430 Sciatica, unspecified side: Secondary | ICD-10-CM

## 2015-05-06 ENCOUNTER — Ambulatory Visit: Payer: No Typology Code available for payment source | Attending: Family Medicine

## 2015-05-06 DIAGNOSIS — M5430 Sciatica, unspecified side: Secondary | ICD-10-CM

## 2015-05-06 DIAGNOSIS — M5431 Sciatica, right side: Secondary | ICD-10-CM | POA: Insufficient documentation

## 2015-05-06 DIAGNOSIS — M47816 Spondylosis without myelopathy or radiculopathy, lumbar region: Secondary | ICD-10-CM | POA: Insufficient documentation

## 2015-05-06 DIAGNOSIS — M4806 Spinal stenosis, lumbar region: Secondary | ICD-10-CM | POA: Insufficient documentation

## 2015-06-03 ENCOUNTER — Ambulatory Visit: Payer: Medicare (Managed Care) | Attending: Orthopaedic Surgery

## 2015-06-03 ENCOUNTER — Other Ambulatory Visit: Payer: Self-pay | Admitting: Orthopaedic Surgery

## 2015-06-03 DIAGNOSIS — M1611 Unilateral primary osteoarthritis, right hip: Secondary | ICD-10-CM | POA: Insufficient documentation

## 2015-06-03 DIAGNOSIS — S73191A Other sprain of right hip, initial encounter: Secondary | ICD-10-CM | POA: Insufficient documentation

## 2015-06-03 DIAGNOSIS — M25551 Pain in right hip: Secondary | ICD-10-CM | POA: Insufficient documentation

## 2015-06-03 DIAGNOSIS — M24851 Other specific joint derangements of right hip, not elsewhere classified: Secondary | ICD-10-CM | POA: Insufficient documentation

## 2015-06-03 DIAGNOSIS — R937 Abnormal findings on diagnostic imaging of other parts of musculoskeletal system: Secondary | ICD-10-CM | POA: Insufficient documentation

## 2015-06-03 DIAGNOSIS — X58XXXA Exposure to other specified factors, initial encounter: Secondary | ICD-10-CM | POA: Insufficient documentation

## 2015-06-03 DIAGNOSIS — S76311A Strain of muscle, fascia and tendon of the posterior muscle group at thigh level, right thigh, initial encounter: Secondary | ICD-10-CM | POA: Insufficient documentation

## 2015-06-03 DIAGNOSIS — M7071 Other bursitis of hip, right hip: Secondary | ICD-10-CM | POA: Insufficient documentation

## 2015-07-22 HISTORY — PX: COLONOSCOPY: SHX174

## 2015-07-27 ENCOUNTER — Emergency Department
Admission: EM | Admit: 2015-07-27 | Discharge: 2015-07-27 | Disposition: A | Discharge status: Home / Self Care | Payer: Medicare (Managed Care) | Attending: Emergency Medicine | Admitting: Emergency Medicine

## 2015-07-27 ENCOUNTER — Emergency Department: Payer: Medicare (Managed Care)

## 2015-07-27 ENCOUNTER — Other Ambulatory Visit: Payer: No Typology Code available for payment source

## 2015-07-27 DIAGNOSIS — Z87891 Personal history of nicotine dependence: Secondary | ICD-10-CM | POA: Insufficient documentation

## 2015-07-27 DIAGNOSIS — G4730 Sleep apnea, unspecified: Secondary | ICD-10-CM | POA: Insufficient documentation

## 2015-07-27 DIAGNOSIS — I803 Phlebitis and thrombophlebitis of lower extremities, unspecified: Secondary | ICD-10-CM | POA: Insufficient documentation

## 2015-07-27 DIAGNOSIS — E785 Hyperlipidemia, unspecified: Secondary | ICD-10-CM | POA: Insufficient documentation

## 2015-07-27 DIAGNOSIS — I809 Phlebitis and thrombophlebitis of unspecified site: Secondary | ICD-10-CM

## 2015-07-27 DIAGNOSIS — G2581 Restless legs syndrome: Secondary | ICD-10-CM | POA: Insufficient documentation

## 2015-07-27 LAB — BASIC METABOLIC PANEL
Anion Gap: 8 (ref 5.0–15.0)
BUN: 20 mg/dL (ref 9–28)
CO2: 23 mEq/L (ref 22–29)
Calcium: 8.8 mg/dL (ref 7.9–10.2)
Chloride: 108 mEq/L (ref 100–111)
Creatinine: 0.8 mg/dL (ref 0.7–1.3)
Glucose: 92 mg/dL (ref 70–100)
Potassium: 4.5 mEq/L (ref 3.5–5.1)
Sodium: 139 mEq/L (ref 136–145)

## 2015-07-27 LAB — CBC AND DIFFERENTIAL
Basophils Absolute Automated: 0.01 10*3/uL (ref 0.00–0.20)
Basophils Automated: 0 %
Eosinophils Absolute Automated: 0.06 10*3/uL (ref 0.00–0.70)
Eosinophils Automated: 1 %
Hematocrit: 42.9 % (ref 42.0–52.0)
Hgb: 14.6 g/dL (ref 13.0–17.0)
Immature Granulocytes Absolute: 0.01 10*3/uL
Immature Granulocytes: 0 %
Lymphocytes Absolute Automated: 1.57 10*3/uL (ref 0.50–4.40)
Lymphocytes Automated: 30 %
MCH: 31.7 pg (ref 28.0–32.0)
MCHC: 34 g/dL (ref 32.0–36.0)
MCV: 93.1 fL (ref 80.0–100.0)
MPV: 9.5 fL (ref 9.4–12.3)
Monocytes Absolute Automated: 0.46 10*3/uL (ref 0.00–1.20)
Monocytes: 9 %
Neutrophils Absolute: 3.05 10*3/uL (ref 1.80–8.10)
Neutrophils: 59 %
Nucleated RBC: 0 /100 WBC (ref 0–1)
Platelets: 188 10*3/uL (ref 140–400)
RBC: 4.61 10*6/uL — ABNORMAL LOW (ref 4.70–6.00)
RDW: 14 % (ref 12–15)
WBC: 5.15 10*3/uL (ref 3.50–10.80)

## 2015-07-27 LAB — IHS D-DIMER: D-Dimer: <0.27 ug/mL FEU (ref 0.00–0.51)

## 2015-07-27 LAB — GFR: EGFR: >60

## 2015-07-27 NOTE — ED Notes (Signed)
E Sign option not available. RN reviewed d/c instruction with pt, pt verbalized understanding & did not have further questions. Safety maintained.

## 2015-07-27 NOTE — ED Notes (Signed)
Right shin pain this am.  No injury. Pain this am.

## 2015-07-27 NOTE — Discharge Instructions (Signed)
Dear [name]:    I appreciate your choosing the [facility] for your healthcare needs, and hope your visit today was EXCELLENT.    Instructions:  Please follow-up with Dr. Adriana Choi, your primary care provider, in 2 days.    You may take Motrin as needed for pain.    Return to the Emergency Department for any worsening symptoms or concerns.    Below is some information that our patients often find helpful.    We wish you good health and please do not hesitate to contact us if we can ever be of any assistance.    Sincerely,  Dorethea Clan, MD  [facility]    ________________________________________________________________    If you do not continue to improve or your condition worsens, please contact your doctor or return immediately to the Emergency Department.    Thank you for choosing [facility] for your emergency care needs.  We strive to provide EXCELLENT care to you and your family.      DOCTOR REFERRALS  Call [phone] if you need any further referrals and we can help you find a primary care doctor or specialist.  Also, available online at:  [email or web address]    YOUR CONTACT INFORMATION  Before leaving please check with registration to make sure we have an up-to-date contact number.  You can call registration at [phone] to update your information.  For questions about your hospital bill, please call [phone].  For questions about your Emergency Dept Physician bill please call [phone].      FREE HEALTH SERVICES  If you need help with health or social services, please call 2-1-1 for a free referral to resources in your area.  2-1-1 is a free service connecting people with information on health insurance, free clinics, pregnancy, mental health, dental care, food assistance, housing, and substance abuse counseling.  Also, available online at:  http://[email or web address].org    MEDICAL RECORDS AND  TESTS  Certain laboratory test results do not come back the same day, for example urine cultures.   We will contact you if other important findings are noted.  Radiology films are often reviewed again to ensure accuracy.  If there is any discrepancy, we will notify you.      Please call [phone] to pick up a complimentary CD of any radiology studies performed.  If you or your doctor would like to request a copy of your medical records, please call [phone].      ORTHOPEDIC INJURY   Please know that significant injuries can exist even when an initial x-ray is read as normal or negative.  This can occur because some fractures (broken bones) are not initially visible on x-rays.  For this reason, close outpatient follow-up with your primary care doctor or bone specialist (orthopedist) is required.    MEDICATIONS AND FOLLOWUP  Please be aware that some prescription medications can cause drowsiness.  Use caution when driving or operating machinery.    The examination and treatment you have received in our Emergency Department is provided on an emergency basis, and is not intended to be a substitute for your primary care physician.  It is important that your doctor checks you again and that you report any new or remaining problems at that time.      24 HOUR PHARMACIES  [facility] [address] [place], [place] [address] (1.4 miles, 7 minutes)  [facility] -  [address], [place], [place] [address] (6.5 miles, 13 minutes)  Handout with directions available on request

## 2015-07-27 NOTE — ED Provider Notes (Signed)
Physician/Midlevel provider first contact with patient: 07/27/15 1334         [facility] EMERGENCY DEPARTMENT HISTORY AND PHYSICAL EXAM    Patient Name: [name], [name]  Encounter Date:  07/27/2015  Rendering Provider: Dorethea Clan, MD  Patient DOB:  [birth year]  MRN:  [id number]    History of Presenting Illness     Historian: Patient and Patient's Wife    74 y.o. male with h/o HLD, restless leg syndrome, sciatica, knee arthroscopy, transurethral resection of the prostate, and hernia repair p/w sudden onset of R anterior shin pain for the past ~5 hours. Pain is worse with bearing weight on the leg. Pt has tried an ace wrap and iced the area with little relief. He wears a neupro patch regularly. Of note, pt had an MRI performed of his back and hip ~1.5 years ago. Denies fever.    PMD:  Donley Redder, MD    Past Medical History     Past Medical History   Diagnosis Date   . Hyperlipidemia    . Sleep apnea      documented w/ CPAP   . Restless leg syndrome    . Sciatica        Past Surgical History     Past Surgical History   Procedure Laterality Date   . Colonoscopy     . Knee arthroscopy w/ meniscal repair Left 2012   . Ankle fracture surgery  2013   . Transurethral resection of prostate     . Hernia repair     . Cystoscopy, turp N/A 04/23/2015     Procedure: CYSTOSCOPY, TURP, BLADDER BIOPSY;  Surgeon: Jobe Gibbon, MD;  Location: [facility] OR;  Service: Urology;  Laterality: N/A;  CYSTOSCOPY, CLOT EVACUATION,  TURP, BLADDER BIOPSY w/FULGERATION       Family History     No family history on file.    Social History     Social History     Social History   . Marital Status: Married     Spouse Name: N/A   . Number of Children: N/A   . Years of Education: N/A     Social History Main Topics   . Smoking status: Former Games developer   . Smokeless tobacco: Never Used   . Alcohol Use: 4.2 oz/week     7 Glasses of wine per week   . Drug Use: No   . Sexual Activity: Not on file     Other Topics Concern   . Not on file      Social History Narrative       Home Medications     Home medications reviewed by ED MD     Discharge Medication List as of 07/27/2015  4:23 PM      CONTINUE these medications which have NOT CHANGED    Details   atorvastatin (LIPITOR) 10 MG tablet Take 10 mg by mouth daily., Until Discontinued, Historical Med      dutasteride (AVODART) 0.5 MG capsule Take 0.5 mg by mouth daily., Until Discontinued, Historical Med      finasteride (PROSCAR) 5 MG tablet Take 5 mg by mouth daily., Until Discontinued, Historical Med      gabapentin (NEURONTIN) 300 MG capsule Take 300 mg by mouth 3 (three) times daily., Until Discontinued, Historical Med      oxyCODONE-acetaminophen (PERCOCET) 5-325 MG per tablet 1-2 tablets by mouth every 4-6 hours as needed for pain;  Do not drive or operate machinery  while taking this medicine, Print      Rotigotine (NEUPRO TD) Place onto the skin. Right upper arm  , Until Discontinued, Historical Med               Review of Systems     Constitutional:  No fever.  MS: +R anterior shin pain.    All other systems reviewed and negative    Physical Exam     BP 120/70 mmHg  Pulse 68  Temp(Src) 97.9 F (36.6 C)  Resp 18  Ht 6\' 1"  (1.854 m)  Wt 94.348 kg  BMI 27.45 kg/m2  SpO2 99%    CONSTITUTIONAL Patient is afebrile, Vital signs reviewed, Patient appears comfortable, Alert and oriented X 3.  HEAD Atraumatic, Normocephalic.  EYES Eyes are normal to inspection, PERRL, No discharge from eyes,  Extraocular muscles intact, Sclera are normal, Conjunctiva are normal.  ENT Posterior pharynx normal, Mouth normal to inspection.  NECK Normal ROM, No meningeal signs, Cervical spine nontender.  RESPIRATORY CHEST Breath sounds normal, No respiratory distress.  CARDIOVASCULAR   RRR, No murmurs, Normal S1 S2, No rub.  ABDOMEN Abdomen is nontender, No distension.  UPPER EXTREMITY Inspection normal, Normal range of motion.   LOWER EXTREMITY Slightly enlarged veins with slight tenderness of the right anterior  shin, No evidence of cellulitis, Pedal pulse 2+ bilaterally, No sign of calf tenderness, Normal range of motion.  NEURO Cranial nerves intact. No focal motor or sensory deficits. Cerebellar intact.   SKIN Skin is warm. Skin is dry.   PSYCHIATRIC Oriented X 3, Normal affect.    ED Medications Administered     ED Medication Orders     None          Orders Placed During This Encounter     Orders Placed This Encounter   Procedures   . US Venous Dopp Right Low Extrem   . Tibia Fibula Right AP and Lateral   . Basic Metabolic Panel (BMP)   . CBC with Differential   . D-Dimer   . GFR       Diagnostic Study Results     The results of the diagnostic studies below were reviewed by the ED provider:    Labs  Results     Procedure Component Value Units Date/Time    Basic Metabolic Panel (BMP) [[id number]] Collected:  07/27/15 1416    Specimen Information:  Blood Updated:  07/27/15 1442     Glucose 92 mg/dL      BUN 20 mg/dL      Creatinine 0.8 mg/dL      Calcium 8.8 mg/dL      Sodium 086 mEq/L      Potassium 4.5 mEq/L      Chloride 108 mEq/L      CO2 23 mEq/L      Anion Gap 8.0     GFR [[id number]] Collected:  07/27/15 1416     EGFR >60.0 Updated:  07/27/15 1442    D-Dimer [[id number]] Collected:  07/27/15 1416     D-Dimer <0.27 ug/mL FEU Updated:  07/27/15 1435    CBC with Differential [[id number]]  (Abnormal) Collected:  07/27/15 1416    Specimen Information:  Blood from Blood Updated:  07/27/15 1425     WBC 5.15 x10 3/uL      Hgb 14.6 g/dL      Hematocrit 64.4 %      Platelets 188 x10 3/uL      RBC 4.61 (L) x10 6/uL  MCV 93.1 fL      MCH 31.7 pg      MCHC 34.0 g/dL      RDW 14 %      MPV 9.5 fL      Neutrophils 59 %      Lymphocytes Automated 30 %      Monocytes 9 %      Eosinophils Automated 1 %      Basophils Automated 0 %      Immature Granulocyte 0 %      Nucleated RBC 0 /100 WBC      Neutrophils Absolute 3.05 x10 3/uL      Abs Lymph Automated 1.57 x10 3/uL      Abs Mono Automated 0.46 x10 3/uL      Abs Eos Automated 0.06  x10 3/uL      Absolute Baso Automated 0.01 x10 3/uL      Absolute Immature Granulocyte 0.01 x10 3/uL           Radiologic Studies  Radiology Results (24 Hour)     Procedure Component Value Units Date/Time    US Venous Dopp Right Low Extrem [[id number]] Collected:  07/27/15 1606    Order Status:  Completed Updated:  07/27/15 1617    Narrative:      History: Right shin pain.    Right lower extremity venous Doppler:   FINDINGS: Duplex evaluation of the deep venous system of the right lower  extremity was performed.   The common femoral vein, superficial femoral  vein and popliteal vein were visualized and appear unremarkable.  These  veins are compressible with no evidence of thrombus.  Doppler  demonstrates patency with normal directional and phasic flow.   Appropriate augmentation was observed.  The proximal deep femoral vein  is visualized and appears clear.  The portions of the posterior tibial  and peroneal veins that were seen compress normally with no thrombus  noted.  There is no indirect evidence of calf vein thrombosis.          Impression:        No evidence of deep venous thrombosis involving the right lower  extremity.     Blair Promise, MD   07/27/2015 4:13 PM      Tibia Fibula Right AP and Lateral [[id number]] Collected:  07/27/15 1502    Order Status:  Completed Updated:  07/27/15 1507    Narrative:      HISTORY: Right anterior leg pain.    There is no acute fracture, dislocation, soft tissue swelling,  degenerative change or foreign body.      Impression:       Negative exam.    Bosie Helper, MD   07/27/2015 3:03 PM            Scribe and MD Attestations     I, Dorethea Clan, MD, personally performed the services documented. Zannie Kehr is scribing for me on [facility]. I reviewed and confirm the accuracy of the information in this medical record.    I, Zannie Kehr, am serving as a Neurosurgeon to document services personally performed by Dorethea Clan, MD, based on the provider's  statements to me.     Credentials: Zannie Kehr, scribe    Rendering Provider: Dorethea Clan, MD    Monitors, EKG, Critical Care, and Splints     EKG (interpreted by ED physician):   Cardiac Monitor (interpreted by ED physician):    Critical Care:   Splint  check:      MDM and Clinical Notes     Notes:  @4 :20PM MDM: In light of negative work-up including ED doppler, very comfortable discharging patient home. Discussed with family at great length.    Consults:    Diagnosis and Disposition     Clinical Impression  1. Phlebitis        Disposition  ED Disposition     Discharge [facility] discharge to home/self care.    Condition at disposition: Stable            Prescriptions       Discharge Medication List as of 07/27/2015  4:23 PM                  Dorethea Clan, MD  08/01/15 1736

## 2015-09-04 ENCOUNTER — Emergency Department: Payer: Medicare (Managed Care)

## 2015-09-04 ENCOUNTER — Other Ambulatory Visit: Payer: No Typology Code available for payment source

## 2015-09-04 ENCOUNTER — Emergency Department: Payer: No Typology Code available for payment source

## 2015-09-04 ENCOUNTER — Emergency Department
Admission: EM | Admit: 2015-09-04 | Discharge: 2015-09-04 | Disposition: A | Discharge status: Home / Self Care | Payer: Medicare (Managed Care) | Attending: Emergency Medicine | Admitting: Emergency Medicine

## 2015-09-04 DIAGNOSIS — S61219A Laceration without foreign body of unspecified finger without damage to nail, initial encounter: Secondary | ICD-10-CM

## 2015-09-04 DIAGNOSIS — S61319A Laceration without foreign body of unspecified finger with damage to nail, initial encounter: Secondary | ICD-10-CM

## 2015-09-04 DIAGNOSIS — W312XXA Contact with powered woodworking and forming machines, initial encounter: Secondary | ICD-10-CM | POA: Insufficient documentation

## 2015-09-04 DIAGNOSIS — E785 Hyperlipidemia, unspecified: Secondary | ICD-10-CM | POA: Insufficient documentation

## 2015-09-04 DIAGNOSIS — S61310A Laceration without foreign body of right index finger with damage to nail, initial encounter: Secondary | ICD-10-CM | POA: Insufficient documentation

## 2015-09-04 DIAGNOSIS — S62660A Nondisplaced fracture of distal phalanx of right index finger, initial encounter for closed fracture: Secondary | ICD-10-CM | POA: Insufficient documentation

## 2015-09-04 MED ORDER — GELATIN ABSORBABLE 12-7 MM EX MISC
1.0000 | Freq: Once | CUTANEOUS | Status: AC
Start: 2015-09-04 — End: 2015-09-04
  Administered 2015-09-04: 1 via TOPICAL
  Filled 2015-09-04: qty 1

## 2015-09-04 MED ORDER — OXYCODONE-ACETAMINOPHEN 5-325 MG PO TABS
ORAL_TABLET | ORAL | Status: DC
Start: 2015-09-04 — End: 2016-03-09

## 2015-09-04 MED ORDER — CEPHALEXIN 500 MG PO CAPS
500.0000 mg | ORAL_CAPSULE | Freq: Four times a day (QID) | ORAL | Status: AC
Start: 2015-09-04 — End: 2015-09-11

## 2015-09-04 NOTE — Discharge Instructions (Signed)
Laceration, Sutures    You have been treated for a laceration (cut).    Follow up with your doctor OR come back here OR go to the nearest Emergency Department to have your sutures (stitches) taken out. Sutures should be taken out in:   10 days.    Use the following wound care instructions:   Keep the wound clean and dry for the next 24 hours. You can wash the wound gently with soap and water.   DO NOT allow your wound to soak in water (don't do the dishes or go swimming, for example). You can shower, but do not rub your stitches too hard. Let the wound dry before putting another bandage on.   Take off old dressings every day. Then put on a clean, dry dressing.   If the bandage sticks to the wound, slightly moisten it with water. This way, it can come off more easily.   You can wash the wound gently with soap and water. To help remove a scab, cleanse the area with a mixture of half hydrogen peroxide and half water. This will also help [place] to take out the sutures later.   Allow the area to dry completely before putting on a new bandage.   Unless you receive instructions not to do so, you can place a thin layer of antibiotic ointment over the wound. You can buy Polysporin, Bacitracin, or Neosporin at the store. Neosporin can sometimes cause irritation to your skin. If this happens, stop using it and switch to another topical (surface) antibiotic.   If needed, put a clean, dry bandage over the wound to protect it.    Keep the affected area elevated (lifted) for the next 24 hours. This will decrease swelling and pain. You may also want to put ice on the area. By applying ice to the affected area, swelling and pain can be reduced. Place some ice cubes in a re-sealable (Ziploc) bag and add some water. Put a thin washcloth between the bag and the skin. Apply the ice bag to the area for at least 20 minutes. Do this at least 4 times per day. It is OK to use the ice more frequently and for longer periods of  time. DO NOT APPLY ICE DIRECTLY TO THE SKIN!    If you had a local anesthetic, it will wear off in about 2 hours. Until then, be careful not to hurt yourself because of having less feeling in the area.    YOU SHOULD SEEK MEDICAL ATTENTION IMMEDIATELY, EITHER HERE OR AT THE NEAREST EMERGENCY DEPARTMENT, IF ANY OF THE FOLLOWING OCCURS:   You see redness or swelling.   There are red streaks going up from the injured area.   The wound smells bad or has a lot of drainage.   You have fever (temperature higher than 100.81F / 38C), chills, worse pain and / or swelling.    Dear [name],    You were seen today by Sherri Sear, PA-C. Thank you for choosing the [facility] for your healthcare needs.  We hope your visit today was EXCELLENT.    Keep wound dry for approximately 24 hours.  After about 24 hours you can wash your finger and shower/bathe like normal.  Wash the area once daily with warm soap and water.  Apply neosporin over your sutures to prevent infection. Do so once daily.  Return to the ER or see your doctor in 4 days for suture removal.  Return sooner for any signs of infection such as fevers, redness or drainage.    Please take any medications prescribed as directed.     If you have any questions or concerns, I am available at [phone]. Please do not hesitate to contact me if I can be of assistance.     Below is some information and resources that our patients often find helpful.    Sincerely,    Sherri Sear, Physician Assistant  [facility]    ________________________________________________________________  Thank you for choosing [facility] for your emergency care needs.  We strive to provide EXCELLENT care to you and your family.      If you do not continue to improve or your condition worsens, please contact your doctor or return immediately to the Emergency Department.    DOCTOR REFERRALS  Call [phone] (available 24 hours a day, 7 days a week) if you need any further referrals and we can help you find a primary care doctor or specialist.  Also, available online at:  [email or web address]    YOUR CONTACT INFORMATION  Before leaving please check with registration to make sure we have an up-to-date contact number.  You can call registration at [phone] to update your information.  For questions about your hospital bill, please call [phone].  For questions about your Emergency Dept Physician bill please call [phone].      FREE HEALTH SERVICES  If you need help with health or social services, please call 2-1-1 for a free referral to resources in your area.  2-1-1 is a free service connecting people with information on health insurance, free clinics, pregnancy, mental health, dental care, food assistance, housing, and substance abuse counseling.  Also, available online at:  http://[email or web address].org    MEDICAL RECORDS AND TESTS  Certain laboratory test results do not come back the same day, for example urine cultures.  We will contact you if other important findings are noted. Radiology films are often reviewed again to ensure accuracy.  If there is any discrepancy, we will notify you.    Please call [phone] to pick up a complimentary CD of any radiology studies performed.  If you or your doctor would like to request a copy of your medical records, please call [phone].      ORTHOPEDIC INJURY   Please know that significant injuries can exist even when an initial x-ray is read as normal or negative.  This can occur because some fractures (broken bones) are not initially visible on x-rays.  For this reason, close outpatient follow-up with your primary care doctor or bone specialist (orthopedist) is required.    MEDICATIONS AND FOLLOWUP  Please be aware that some prescription medications can cause drowsiness.  Use caution when driving or operating machinery.    The  examination and treatment you have received in our Emergency Department is provided on an emergency basis, and is not intended to be a substitute for your primary care physician.  It is important that your doctor checks you again and that you report any new or remaining problems at that time.      24 HOUR PHARMACIES  [facility] [address], [place], [place] [address] (1.4 miles, 7 minutes)  [facility] - [address] [place], [place] [address] (6.5 miles, 13 minutes)  Handout with directions available on request.

## 2015-09-04 NOTE — ED Provider Notes (Signed)
Physician/Midlevel provider first contact with patient: 09/04/15 1455         EMERGENCY DEPARTMENT HISTORY AND PHYSICAL EXAM      Patient Information     Patient Name: [name], [name]  Encounter Date:  09/04/2015  Patient DOB:  March 16, 1941  MRN:  [id number]  Room:  34/SS 34  Rendering Provider: Sherri Sear, PA-C    History of Presenting Illness     Chief Complaint: finger laceration  Historian: pt  Onset: sudden  Quality: throbbing  Location: right index and long finger   Duration: just PTA       HPI Comments:   74 y.o. male with history of sciatica and 15 and presents emergency room with finger laceration just prior to arrival. Patient was using a table saw on his right index and long finger were lacerated. His tetanus is up-to-date. He, as a throbbing without numbness or tingling. Bleeding has been difficult to control. He is not on any anticoagulants.      PMD: Donley Redder, MD    Past Medical History     Past Medical History   Diagnosis Date   . Hyperlipidemia    . Sleep apnea      documented w/ CPAP   . Restless leg syndrome    . Sciatica        Past Surgical History     Past Surgical History   Procedure Laterality Date   . Colonoscopy     . Knee arthroscopy w/ meniscal repair Left 2012   . Ankle fracture surgery  2013   . Transurethral resection of prostate     . Hernia repair     . Cystoscopy, turp N/A 04/23/2015     Procedure: CYSTOSCOPY, TURP, BLADDER BIOPSY;  Surgeon: Jobe Gibbon, MD;  Location: [facility] OR;  Service: Urology;  Laterality: N/A;  CYSTOSCOPY, CLOT EVACUATION,  TURP, BLADDER BIOPSY w/FULGERATION       Family History     No family history on file.    Social History     Social History     Social History   . Marital Status: Married     Spouse Name: N/A   . Number of Children: N/A   . Years of Education: N/A     Social History Main Topics   . Smoking status: Former Games developer   . Smokeless tobacco: Never Used   . Alcohol Use: 4.2 oz/week     7 Glasses of wine per week   . Drug Use: No    . Sexual Activity: Not on file     Other Topics Concern   . Not on file     Social History Narrative       Allergies     No Known Allergies    Home Medications     Prior to Admission medications    Medication Sig Start Date End Date Taking? Authorizing Provider   atorvastatin (LIPITOR) 10 MG tablet Take 10 mg by mouth daily.    [provider]   cephALEXin (KEFLEX) 500 MG capsule Take 1 capsule (500 mg total) by mouth 4 (four) times daily. 09/04/15 09/11/15  Sherri Sear H, PA   dutasteride (AVODART) 0.5 MG capsule Take 0.5 mg by mouth daily.    [provider]   finasteride (PROSCAR) 5 MG tablet Take 5 mg by mouth daily.    [provider]   gabapentin (NEURONTIN) 300 MG capsule Take 300 mg by mouth 3 (three) times daily.  [provider]   oxyCODONE-acetaminophen (PERCOCET) 5-325 MG per tablet 1-2 tablets by mouth every 4-6 hours as needed for pain;  Do not drive or operate machinery while taking this medicine 04/20/15   Whitman Hero, MD   oxyCODONE-acetaminophen (PERCOCET) 5-325 MG per tablet 1-2 tablets by mouth every 4-6 hours as needed for pain;  Do not drive or operate machinery while taking this medicine 09/04/15   Sherri Sear H, PA   Rotigotine (NEUPRO TD) Place onto the skin. Right upper arm      [provider]         Review of Systems     Review of Systems   Constitutional: Negative for fever.  Musculoskeletal: + R index and long finger.  Neurological: Negative for paresthesias.   Skin: + laceration and swelling   All other systems reviewed and are negative.      Physical Exam     Patient Vitals for the past 24 hrs:   BP Temp Pulse Resp SpO2 Height Weight   09/04/15 1521 122/66 mmHg - - - - - -   09/04/15 1451 - 98.2 F (36.8 C) 80 18 95 % 6\' 1"  (1.854 m) 94.348 kg       Physical Exam   Nursing note and vitals reviewed.   Constitutional: Pt is oriented to person, place, and time. Pt appears well-developed and well-nourished. Shaking, nervous.    HENT:     Head: Normocephalic and atraumatic.   Eyes: Conjunctivae normal and EOM are normal.   Neck: Normal range of motion.   Cardiovascular: Intact distal pulses. 2+ radial pulses.   Pulmonary/Chest: No respiratory distress. O2 sat 95% on RA.   Musculoskeletal: RIGHT UPPER EXTREMITY: right index finger with jagged laceration which removed approximately 1/3 of the nail and curves to the volar aspect of the hand. Right long finger with volar curved laceration, no nail injury, bleeding profusely.   Neurological: Pt is alert and oriented to person, place, and time. GCS 15.   Skin: Skin is warm and dry.   Psychiatric: Normal mood and affect. Behavior is normal.         Orders Placed During This Encounter     Orders Placed This Encounter   Procedures   . Splint - Upper Extremity   . Finger(s) Right Minimum 2 Views   . Finger(s) Right Minimum 2 Views       ED Medications Administered     ED Medication Orders     Start Ordered     Status Ordering Provider    09/04/15 1651 09/04/15 1650  gelatin adsorbable (GELFOAM) sponge 1 each   Once     Route: Topical  Ordered Dose: 1 each     Last MAR action:  Given Dreydon Cardenas H          Diagnostic Study Results and Data Review     The results of the diagnostic studies below were reviewed by the ED provider:    Labs  Results     ** No results found for the last 24 hours. **          Radiologic Studies  Radiology Results (24 Hour)     Procedure Component Value Units Date/Time    Finger(s) Right Minimum 2 Views [[id number]] Collected:  09/04/15 1601    Order Status:  Completed Updated:  09/04/15 1610    Narrative:      Clinical history: Status post injury to right index finger and middle  finger.    Frontal view of the right hand and two additional views of the right  index finger and right middle finger were obtained. Comparison made to  prior study dated 08/06/2010.    Findings:    There is a nondisplaced fracture at the proximal aspect of the right  second distal phalanx. An  adjacent small radiodensity reflects a small  foreign body. There is soft tissue laceration at the distal aspect of  the right index finger. There is soft tissue laceration at the distal  aspect of the right middle finger. There are degenerative changes of the  DIP joints.      Impression:          1. Nondisplaced fracture at the proximal aspect of the right second  distal phalanx, small adjacent radiopaque foreign body, and laceration  at the distal aspect of the right index finger.  2. Soft tissue laceration at the distal aspect of the right middle  finger.    Georgiana Spinner, MD   09/04/2015 4:06 PM      Finger(s) Right Minimum 2 Views [[id number]] Collected:  09/04/15 1601    Order Status:  Completed Updated:  09/04/15 1610    Narrative:      Clinical history: Status post injury to right index finger and middle  finger.    Frontal view of the right hand and two additional views of the right  index finger and right middle finger were obtained. Comparison made to  prior study dated 08/06/2010.    Findings:    There is a nondisplaced fracture at the proximal aspect of the right  second distal phalanx. An adjacent small radiodensity reflects a small  foreign body. There is soft tissue laceration at the distal aspect of  the right index finger. There is soft tissue laceration at the distal  aspect of the right middle finger. There are degenerative changes of the  DIP joints.      Impression:          1. Nondisplaced fracture at the proximal aspect of the right second  distal phalanx, small adjacent radiopaque foreign body, and laceration  at the distal aspect of the right index finger.  2. Soft tissue laceration at the distal aspect of the right middle  finger.    Georgiana Spinner, MD   09/04/2015 4:06 PM            Abnormal results/incidental findings discussed with pt and/or family: yes    Monitors, EKG, Procedures, Critical Care, and Splints     Laceration Repair    Time Out:  The patient was identified by medical record number  and confirming the patient's name. The side and location of wound/laceration was verified. Verbal consent obtained.     Tetanus Status:  Tetanus status up to date (yes/no): yes  Tetanus ordered (yes/no): no    Local Anesthetic Used: 1% lido w/o epi  Amount Used: 3 cc    Full Sterile Prep:  - Sterile procedures observed with sterile site prep & hand washing  - Normal Saline Irrigation (yes/no): Yes  - Sterile Gloves: Yes, 7.0  - Mask: No  - Gown: No  - Sterile Drape: Yes    Laceration Exam:    Anatomic Location: right index  Shape of Laceration (linear, avulsion, stellate, etc): avulsion and stellate  Wound Edges: jagged   Puncture Wound (yes/no): no  Foreign Body: no  Base of laceration visualized under a bloodless field. Clean wound edges.  No foreign bodies noted.     Simple vs Complex:  Length (cm): 3 cm  Superficial vs Deep: superficial  Simple vs Complex: complex    Laceration Procedure:  Extensive wound cleaning and irrigation.  Suture Type and Size: 4-0 nylon  Superficial vs Deep Sutures: superficial  Suture Technique: simple interrupted  Number of sutures/staples placed: 6    Post Procedure:  Neurovascular and motor status normal after procedure.  Patient tolerated procedure (well, fairly well, poorly): Well  Complications: None    Laceration Exam:    Anatomic Location: right long finger  Shape of Laceration (linear, avulsion, stellate, etc): avulsion and stellate  Wound Edges: jagged   Puncture Wound (yes/no): no  Foreign Body: no  Base of laceration visualized under a bloodless field. Clean wound edges. No foreign bodies noted.     Simple vs Complex:  Length (cm): 3x2 cm  Superficial vs Deep: superficial  Simple vs Complex: complex    Laceration Procedure:  Extensive wound cleaning and irrigation.  Suture Type and Size: 4-0 nylon  Superficial vs Deep Sutures: superficial  Suture Technique: simple interrupted  Number of sutures/staples placed: 8    Radiology: distal phalanx fracture.    Patient/parent  instructed on wound care instructions. Pt/parent advised that they may return to the ER or see their PCP to have sutures/staples removed. Cautioned to return for any signs or symptoms of infection including fever, chills, redness, drainage, etc.       MDM and Clinical Notes     Working Differential (not completely inclusive): Retained FB, vascular injury, muscle injury, nerve injury, laceration w/o complication, etc    Nursing records reviewed and agree: Yes    Clinical Notes & Decision Making:   Laceration repaired after nerve block and local infiltration. Xray with distal phalanx fx of the right index finger; finger placed in extension. Given keflex and pain medications. 10 day return for suture removal.     Prescriptions     New Prescriptions    CEPHALEXIN (KEFLEX) 500 MG CAPSULE    Take 1 capsule (500 mg total) by mouth 4 (four) times daily.    OXYCODONE-ACETAMINOPHEN (PERCOCET) 5-325 MG PER TABLET    1-2 tablets by mouth every 4-6 hours as needed for pain;  Do not drive or operate machinery while taking this medicine       Diagnosis and Disposition     Clinical Impression:  1. Closed nondisplaced fracture of distal phalanx of right index finger, initial encounter    2. Finger laceration, initial encounter    3. Laceration of nail bed of finger, initial encounter      Final diagnoses:   Closed nondisplaced fracture of distal phalanx of right index finger, initial encounter   Finger laceration, initial encounter   Laceration of nail bed of finger, initial encounter       Disposition:  ED Disposition     Discharge [facility] discharge to home/self care.    Condition at disposition: Stable                  Rendering Provider: Sherri Sear, PA-C    Attending's signature signifies review of the provider note and clinical impression.      Sherri Sear [place], [place]  09/04/15 1713    Isidoro Donning, MD  09/04/15 [phone]

## 2015-09-04 NOTE — ED Provider Notes (Signed)
Physician/Midlevel provider first contact with patient: 09/04/15 1455         EMERGENCY DEPARTMENT HISTORY AND PHYSICAL EXAM    Patient Name: [name], [name]  Encounter Date:  09/04/2015  Attending Physician: Netta Neat, MD  Patient DOB:  1941/01/07  MRN:  [id number]  Room:  34/SS 34    History of Presenting Illness     Historian: Patient    74 y.o. male p/w 2 cm lac to R index finger and 0.5 cm lac to R middle finger s/p accidentally cutting himself while using a table saw ~30 mins PTA.  Associated w/ severe R index finger pain and abrasions to L middle finger.  Tetanus UTD.       Nursing (triage) note reviewed for the following pertinent information:    Pt was using a table saw and cut right pointer and middle finger - pressure bandage applied - Pt shaking from pain       PMD:  Donley Redder, MD    Past Medical History     Past Medical History   Diagnosis Date   . Hyperlipidemia    . Sleep apnea      documented w/ CPAP   . Restless leg syndrome    . Sciatica        Past Surgical History     Past Surgical History   Procedure Laterality Date   . Colonoscopy     . Knee arthroscopy w/ meniscal repair Left 2012   . Ankle fracture surgery  2013   . Transurethral resection of prostate     . Hernia repair     . Cystoscopy, turp N/A 04/23/2015     Procedure: CYSTOSCOPY, TURP, BLADDER BIOPSY;  Surgeon: Jobe Gibbon, MD;  Location: [facility] OR;  Service: Urology;  Laterality: N/A;  CYSTOSCOPY, CLOT EVACUATION,  TURP, BLADDER BIOPSY w/FULGERATION       Family History     No family history on file.    Social History     Social History     Social History   . Marital Status: Married     Spouse Name: N/A   . Number of Children: N/A   . Years of Education: N/A     Social History Main Topics   . Smoking status: Former Games developer   . Smokeless tobacco: Never Used   . Alcohol Use: 4.2 oz/week     7 Glasses of wine per week   . Drug Use: No   . Sexual Activity: Not on file     Other Topics Concern   . Not on file     Social  History Narrative       Home Medications     Home medications reviewed by ED MD    Previous Medications    ATORVASTATIN (LIPITOR) 10 MG TABLET    Take 10 mg by mouth daily.    DUTASTERIDE (AVODART) 0.5 MG CAPSULE    Take 0.5 mg by mouth daily.    FINASTERIDE (PROSCAR) 5 MG TABLET    Take 5 mg by mouth daily.    GABAPENTIN (NEURONTIN) 300 MG CAPSULE    Take 300 mg by mouth 3 (three) times daily.    OXYCODONE-ACETAMINOPHEN (PERCOCET) 5-325 MG PER TABLET    1-2 tablets by mouth every 4-6 hours as needed for pain;  Do not drive or operate machinery while taking this medicine    ROTIGOTINE (NEUPRO TD)    Place onto the skin. Right upper arm  Review of Systems     ROS  MS: + R index finger pain  Skin: + laceration to R index finger and R middle finger, + abrasions to L middle finger  All other systems reviewed and negative      Physical Exam     BP 122/66 mmHg  Pulse 80  Temp(Src) 98.2 F (36.8 C)  Resp 18  Ht 6\' 1"  (1.854 m)  Wt 94.348 kg  BMI 27.45 kg/m2  SpO2 95%    Constitutional: Vital signs reviewed. Well appearing.  Head: Normocephalic, atraumatic  Eyes: Conjunctiva and sclera are normal.  No injection or discharge.  Ears, Nose, Throat:  Normal external examination of the nose and ears.    Neck: Normal range of motion. Trachea midline.  Respiratory/Chest: No respiratory distress.   Upper extremity:  On the R index finger, 2 cm laceration extending volar aspect to nail bed. Approximately 0.5 cm laceration on the R middle finger extending through lateral nailbed.  Flexion and extension intact at DIP and PIP joints. Abrasions to dorsum of L middle finger  Neurological: No focal motor deficits by observation. Speech normal.  Skin: Warm and dry. No rash.  Psychiatric: Alert and conversant.  Normal affect.  Normal insight.        Clinical Imaging     The patient and/or family gave verbal consent for photography and electronic transmission using Epic Haiku app for the purposes of capturing clinical images  into the patient's chart.     Clinical images are available in Epic under:    Chart Review / Media / Photographs / File Link / Best Fit          ED Medications Administered     ED Medication Orders     Start Ordered     Status Ordering Provider    09/04/15 1651 09/04/15 1650  gelatin adsorbable (GELFOAM) sponge 1 each   Once     Route: Topical  Ordered Dose: 1 each     Last MAR action:  Given SAUNDERS, FAITH H          Orders Placed During This Encounter     Orders Placed This Encounter   Procedures   . Splint - Upper Extremity   . Finger(s) Right Minimum 2 Views   . Finger(s) Right Minimum 2 Views       Diagnostic Study Results     The results of the diagnostic studies below were reviewed by the ED provider:    Labs  Results     ** No results found for the last 24 hours. **          Radiologic Studies  Radiology Results (24 Hour)     Procedure Component Value Units Date/Time    Finger(s) Right Minimum 2 Views [[id number]] Collected:  09/04/15 1601    Order Status:  Completed Updated:  09/04/15 1610    Narrative:      Clinical history: Status post injury to right index finger and middle  finger.    Frontal view of the right hand and two additional views of the right  index finger and right middle finger were obtained. Comparison made to  prior study dated 08/06/2010.    Findings:    There is a nondisplaced fracture at the proximal aspect of the right  second distal phalanx. An adjacent small radiodensity reflects a small  foreign body. There is soft tissue laceration at the distal aspect of  the right index finger. There is  soft tissue laceration at the distal  aspect of the right middle finger. There are degenerative changes of the  DIP joints.      Impression:          1. Nondisplaced fracture at the proximal aspect of the right second  distal phalanx, small adjacent radiopaque foreign body, and laceration  at the distal aspect of the right index finger.  2. Soft tissue laceration at the distal aspect of the right  middle  finger.    Georgiana Spinner, MD   09/04/2015 4:06 PM      Finger(s) Right Minimum 2 Views [[id number]] Collected:  09/04/15 1601    Order Status:  Completed Updated:  09/04/15 1610    Narrative:      Clinical history: Status post injury to right index finger and middle  finger.    Frontal view of the right hand and two additional views of the right  index finger and right middle finger were obtained. Comparison made to  prior study dated 08/06/2010.    Findings:    There is a nondisplaced fracture at the proximal aspect of the right  second distal phalanx. An adjacent small radiodensity reflects a small  foreign body. There is soft tissue laceration at the distal aspect of  the right index finger. There is soft tissue laceration at the distal  aspect of the right middle finger. There are degenerative changes of the  DIP joints.      Impression:          1. Nondisplaced fracture at the proximal aspect of the right second  distal phalanx, small adjacent radiopaque foreign body, and laceration  at the distal aspect of the right index finger.  2. Soft tissue laceration at the distal aspect of the right middle  finger.    Georgiana Spinner, MD   09/04/2015 4:06 PM            Scribe and MD Attestations     I, Netta Neat, MD, personally performed the services documented. Young Joon Buzzy Han is scribing for me on [facility]. I reviewed and confirm the accuracy of the information in this medical record.    I, Young Williemae Area, am serving as a scribe to document services personally performed by Netta Neat, MD, based on the provider's statements to me.     Rendering Provider: Netta Neat, MD      Monitors, EKG, Critical Care, and Splints     EKG (interpreted by ED physician):   Cardiac Monitor (interpreted by ED physician):       Critical Care:   Splint check:      MDM and Clinical Notes     Next day phone follow-up.  Advised hand surgery eval.  Pt doing well at home. Sx stable.    Lac repaired by Carolina Cellar,  APC.    Diagnosis and Disposition     Clinical Impression  1. Closed nondisplaced fracture of distal phalanx of right index finger, initial encounter    2. Finger laceration, initial encounter    3. Laceration of nail bed of finger, initial encounter        Disposition  ED Disposition     Discharge [facility] discharge to home/self care.    Condition at disposition: Stable            Prescriptions       New Prescriptions    CEPHALEXIN (KEFLEX) 500 MG CAPSULE    Take  1 capsule (500 mg total) by mouth 4 (four) times daily.    OXYCODONE-ACETAMINOPHEN (PERCOCET) 5-325 MG PER TABLET    1-2 tablets by mouth every 4-6 hours as needed for pain;  Do not drive or operate machinery while taking this medicine           Isidoro Donning, MD  09/12/15 1657

## 2015-09-04 NOTE — ED Notes (Signed)
Pt c/o right 2nd and 3rd digit lacerations s/p injury from a table saw. Tetanus UTD in the last 5 years.

## 2015-09-14 ENCOUNTER — Emergency Department
Admission: EM | Admit: 2015-09-14 | Discharge: 2015-09-14 | Disposition: A | Discharge status: Home / Self Care | Payer: No Typology Code available for payment source | Attending: Emergency Medicine | Admitting: Emergency Medicine

## 2015-09-14 ENCOUNTER — Emergency Department: Payer: No Typology Code available for payment source

## 2015-09-14 DIAGNOSIS — Z4802 Encounter for removal of sutures: Secondary | ICD-10-CM | POA: Insufficient documentation

## 2015-09-15 ENCOUNTER — Other Ambulatory Visit: Payer: Self-pay | Admitting: Specialist

## 2015-09-22 ENCOUNTER — Other Ambulatory Visit: Payer: Self-pay | Admitting: Specialist

## 2015-10-04 HISTORY — PX: SKIN BIOPSY: SHX1

## 2015-10-12 DIAGNOSIS — M5136 Other intervertebral disc degeneration, lumbar region: Secondary | ICD-10-CM | POA: Insufficient documentation

## 2015-10-13 ENCOUNTER — Other Ambulatory Visit: Payer: Self-pay | Admitting: Specialist

## 2016-02-11 ENCOUNTER — Emergency Department
Admission: EM | Admit: 2016-02-11 | Discharge: 2016-02-11 | Disposition: A | Discharge status: Home / Self Care | Payer: Medicare (Managed Care) | Attending: Emergency Medicine | Admitting: Emergency Medicine

## 2016-02-11 ENCOUNTER — Emergency Department: Payer: Medicare (Managed Care)

## 2016-02-11 DIAGNOSIS — G2581 Restless legs syndrome: Secondary | ICD-10-CM | POA: Insufficient documentation

## 2016-02-11 DIAGNOSIS — M79604 Pain in right leg: Secondary | ICD-10-CM

## 2016-02-11 DIAGNOSIS — E785 Hyperlipidemia, unspecified: Secondary | ICD-10-CM | POA: Insufficient documentation

## 2016-02-11 NOTE — Discharge Instructions (Signed)
Return immediately to the emergency room if you have any worsening symptoms!!! Otherwise, see general doctor tomorrow, or return to the emergency department tomorrow if you have any trouble getting an appointment.     Thank you for choosing [facility] for your emergency care needs.  We strive to provide EXCELLENT care to you and your family.      If you do not continue to improve or your condition worsens, please contact your doctor or return immediately to the Emergency Department.    The examination and treatment you have received in our Emergency Department is provided on an emergency basis, and is not intended to be a substitute for your primary care physician.  It is important that your doctor checks you again and that you report any new or remaining problems at that time.      DOCTOR REFERRALS  Call [phone] (available 24 hours a day, 7 days a week) if you need any further referrals and we can help you find a primary care doctor or specialist.  Also, available online at:  [email or web address]    YOUR CONTACT INFORMATION  Before leaving please check with registration to make sure we have an up-to-date contact number.  You can call registration at [phone] to update your information.  For questions about your hospital bill, please call [phone].  For questions about your Emergency Dept Physician bill please call [phone].      FREE HEALTH SERVICES  If you need help with health or social services, please call 2-1-1 for a free referral to resources in your area.  2-1-1 is a free service connecting people with information on health insurance, free clinics, pregnancy, mental health, dental care, food assistance, housing, and substance abuse counseling.  Also, available online at:  [email or web address]    MEDICAL RECORDS AND TESTS  Certain laboratory test results do not come back the same day, for example urine cultures.   We will contact you if other  important findings are noted.  Radiology films are often reviewed again to ensure accuracy.  If there is any discrepancy, we will notify you.      Please call [phone] to pick up a complimentary CD of any radiology studies performed.  If you or your doctor would like to request a copy of your medical records, please call [phone].      ORTHOPEDIC INJURY   Please know that significant injuries can exist even when an initial x-ray is read as normal or negative.  This can occur because some fractures (broken bones) are not initially visible on x-rays.  For this reason, close outpatient follow-up with your primary care doctor or bone specialist (orthopedist) is required.    MEDICATIONS AND FOLLOWUP  Please be aware that some prescription medications can cause drowsiness.  Use caution when driving or operating machinery.    24 HOUR PHARMACIES  [facility] - [address], [place], [place] [address] (1.4 miles, 7 minutes)  [facility] - [address], [place], [place] [address] (6.5 miles, 13 minutes)  A handout with directions is available on request.

## 2016-02-11 NOTE — ED Provider Notes (Signed)
Physician/Midlevel provider first contact with patient: 02/11/16 0924         EMERGENCY DEPARTMENT HISTORY AND PHYSICAL EXAM    Patient Name: [name], [name]  Encounter Date:  02/11/2016  Attending Physician: Elray Mcgregor, MD  Patient DOB:  January 18, 1941  MRN:  [id number]  Room:  04/A04    History of Presenting Illness     Historian: Patient    75 y.o. male former smoker with h/o HLD and sciatica p/w sudden onset of sharp right lower shin pain that began while he was standing 2.5 hours ago.  Pain radiated up to his knee.  Patient had just walked out to get the newspaper and came back into his kitchen.  Pain was sharp at first, but he now explains that it feels like muscle cramping in this area.  He had a similar episode 2 weeks ago which resolved over time.  He was seen by his PCP who performed blood work which was all negative.  He had a superficial blood clot in this leg 1 year ago, but no h/o DVT.  Denies CP, SOB, and leg swelling.    PMD:  Donley Redder, MD    Nursing Notes Review  Nursing Notes were reviewed.     Previous Records Review  Previous records were reviewed to the extent practicable for the current presentation.    Past Medical History     Past Medical History   Diagnosis Date   . Hyperlipidemia    . Sleep apnea      documented w/ CPAP   . Restless leg syndrome    . Sciatica        No current facility-administered medications for this encounter.    Current outpatient prescriptions:   .  eszopiclone (LUNESTA) 1 MG tablet, Take 1 mg by mouth nightly. Take immediately before bedtime, Disp: , Rfl:   .  atorvastatin (LIPITOR) 10 MG tablet, Take 10 mg by mouth daily., Disp: , Rfl:   .  dutasteride (AVODART) 0.5 MG capsule, Take 0.5 mg by mouth daily., Disp: , Rfl:   .  finasteride (PROSCAR) 5 MG tablet, Take 5 mg by mouth daily., Disp: , Rfl:   .  gabapentin (NEURONTIN) 300 MG capsule, Take 300 mg by mouth 3 (three) times daily., Disp: , Rfl:   .  oxyCODONE-acetaminophen (PERCOCET) 5-325 MG per  tablet, 1-2 tablets by mouth every 4-6 hours as needed for pain;  Do not drive or operate machinery while taking this medicine, Disp: 10 tablet, Rfl: 0  .  oxyCODONE-acetaminophen (PERCOCET) 5-325 MG per tablet, 1-2 tablets by mouth every 4-6 hours as needed for pain;  Do not drive or operate machinery while taking this medicine, Disp: 15 tablet, Rfl: 0  .  Rotigotine (NEUPRO TD), Place onto the skin. Right upper arm , Disp: , Rfl:     No Known Allergies    Medical history, medications, and allergies reviewed.     Past Surgical History     Past Surgical History   Procedure Laterality Date   . Colonoscopy     . Knee arthroscopy w/ meniscal repair Left 2012   . Ankle fracture surgery  2013   . Transurethral resection of prostate     . Hernia repair     . Cystoscopy, turp N/A 04/23/2015     Procedure: CYSTOSCOPY, TURP, BLADDER BIOPSY;  Surgeon: Jobe Gibbon, MD;  Location: [facility] OR;  Service: Urology;  Laterality: N/A;  CYSTOSCOPY, CLOT EVACUATION,  TURP,  BLADDER BIOPSY w/FULGERATION       Family History   The family history is not significantly contributory to current presentation.   History reviewed. No pertinent family history.    Social History   Social history is not significantly contributory to the patient's current presentation.   Social History     Social History   . Marital Status: Married     Spouse Name: N/A   . Number of Children: N/A   . Years of Education: N/A     Social History Main Topics   . Smoking status: Former Games developer   . Smokeless tobacco: Never Used   . Alcohol Use: 4.2 oz/week     7 Glasses of wine per week   . Drug Use: No   . Sexual Activity: Not on file     Other Topics Concern   . Not on file     Social History Narrative       Home Medications     Home medications reviewed by ED MD     Previous Medications    ATORVASTATIN (LIPITOR) 10 MG TABLET    Take 10 mg by mouth daily.    DUTASTERIDE (AVODART) 0.5 MG CAPSULE    Take 0.5 mg by mouth daily.    ESZOPICLONE (LUNESTA) 1 MG TABLET     Take 1 mg by mouth nightly. Take immediately before bedtime    FINASTERIDE (PROSCAR) 5 MG TABLET    Take 5 mg by mouth daily.    GABAPENTIN (NEURONTIN) 300 MG CAPSULE    Take 300 mg by mouth 3 (three) times daily.    OXYCODONE-ACETAMINOPHEN (PERCOCET) 5-325 MG PER TABLET    1-2 tablets by mouth every 4-6 hours as needed for pain;  Do not drive or operate machinery while taking this medicine    OXYCODONE-ACETAMINOPHEN (PERCOCET) 5-325 MG PER TABLET    1-2 tablets by mouth every 4-6 hours as needed for pain;  Do not drive or operate machinery while taking this medicine    ROTIGOTINE (NEUPRO TD)    Place onto the skin. Right upper arm         Review of Systems     See HPI for review of systems that is relevant to the current presentation.   All other systems reviewed: negative.     Physical Exam     Vital Signs  BP 133/93 mmHg  Pulse 67  Temp(Src) 98.4 F (36.9 C)  Resp 18  Ht 6\' 1"  (1.854 m)  Wt 94 kg  BMI 27.35 kg/m2  SpO2 96%    Review of Vital Signs  The patient's vital signs and oxygen saturation were reviewed and interpreted by me, Elray Mcgregor, MD.    Physical Exam    Constitutional: No acute distress. Color is not ashen.   Eyes: No conjunctival discharge. EOMI.   Head, Ears, Nose, Mouth, Throat: Normocephalic. Moist mucous membranes.   Neck: Full range of motion. No obvious neck deformities. No tracheal deviation.   Cardiovascular: No obvious rubs or gallops. Normal cap refill. Normal periph pulses.  Respiratory/Chest: No obvious chest wall asymmetry. No resp distress.    Gastrointestinal/Abdominal:  Soft abdomen. No abdominal distention.   Musculoskeletal: Normal ROM. Minimal tenderness to his right anterior shin next to the tibia. Otherwise no tenderness anywhere. No sign of Baker's cyst. Strong DP pulse.   Back: No deformity. No significant scoliosis.   Neurological: Alert. No acute focal deficits. Normal strength and sensation.  Skin: Warm skin. No  acute rash. No swelling or redness.    ED  Medications Administered     ED Medication Orders     None          Orders Placed During This Encounter     Orders Placed This Encounter   Procedures   . [place] VenoDopp Low Extremity Right       Diagnostic Study Results     The results of the diagnostic studies below were reviewed by the ED provider:    Labs  Results     ** No results found for the last 24 hours. **          Radiologic Studies  Radiology Results (24 Hour)     Procedure Component Value Units Date/Time    [place] VenoDopp Low Extremity Right [[id number]] Collected:  02/11/16 1024    Order Status:  Completed Updated:  02/11/16 1029    Narrative:      HISTORY: Right leg pain.    FINDINGS: Deep veins from the right common femoral to popliteal  trifurcation demonstrate normal compressibility, color and Doppler flow,  and augmentation.  The origin of the profunda femoral vein and  visualized calf veins also appear patent.      Impression:        No evidence for DVT.    Prince Solian, MD   02/11/2016 10:25 AM            Scribe and MD Attestations     I, Elray Mcgregor, MD, personally performed the services documented. Carlyn Jimmey Ralph Poindexter is scribing for me on [employer]. I reviewed and confirm the accuracy of the information in this medical record.     I, Carlyn [employer], am serving as a Neurosurgeon to document services personally performed by Elray Mcgregor, MD, based on the provider's statements to me.     Credentials: Ritta Slot, scribe    Rendering Provider: Elray Mcgregor, MD      MDM and Clinical Notes       Hx & Exam Synthesis, Differential Diagnosis, and Plan    Right shin pain.  Mostly better now.  Unlikely to be DVT.  Could be a variant of sciatica/periformis syndrome.  Could be mild muscle strain.  Will check U/S.  If ED course is unremarkable will likely discharge with close follow-up.     ED Course, Monitors, EKG, Critical Care, Splints, Consults, Reevaluation, etc       Return Precautions    I  counseled extensively on nature of problem--voiced understanding, agrees to follow up.   Given strict return precautions--fully understands:   Pt is to return immediately to emergency department if any worsening symptoms at all--otherwise, return to the emergency department tomorrow for a recheck or follow up with primary physician tomorrow.    Diagnosis and Disposition     Clinical Impression  1. Right leg pain        Disposition  ED Disposition     Discharge [facility] discharge to home/self care.    Condition at disposition: Stable            Prescriptions       New Prescriptions    No medications on file                     Elray Mcgregor, MD  02/11/16 1504

## 2016-02-11 NOTE — ED Notes (Signed)
Pt to the ed with sudden sharp pain in rt calf this am. Positive pms

## 2016-02-11 NOTE — ED Notes (Signed)
Patients vital signs stable, alert and oriented x4, discharge instructions reviewed, patient verbalized understanding

## 2016-03-09 ENCOUNTER — Ambulatory Visit (INDEPENDENT_AMBULATORY_CARE_PROVIDER_SITE_OTHER): Payer: Medicare (Managed Care) | Admitting: Family Medicine

## 2016-03-09 ENCOUNTER — Encounter (INDEPENDENT_AMBULATORY_CARE_PROVIDER_SITE_OTHER): Payer: Self-pay

## 2016-03-09 VITALS — BP 121/72 | HR 69 | Temp 97.3°F | Resp 16 | Ht 73.0 in | Wt 218.0 lb

## 2016-03-09 DIAGNOSIS — H6501 Acute serous otitis media, right ear: Secondary | ICD-10-CM

## 2016-03-09 MED ORDER — AMOXICILLIN-POT CLAVULANATE 875-125 MG PO TABS
1.0000 | ORAL_TABLET | Freq: Two times a day (BID) | ORAL | Status: AC
Start: 2016-03-09 — End: 2016-03-19

## 2016-03-09 NOTE — Patient Instructions (Signed)
Middle Ear Infection (Adult)  You have an infection of the middle ear, the space behind the eardrum. This is also called acute otitis media (AOM). Sometimes it is caused by the common cold. This is because congestion can block the internal passage (eustachian tube) that drains fluid from the middle ear. When the middle ear fills with fluid, bacteria can grow there and cause an infection. Oral antibiotics are used to treat this illness, not ear drops. Symptoms usually start to improve within 1 to 2 days of treatment.    Home care  The following are general care guidelines:   Finish all of the antibiotic medicine given, even though you may feel better after the first few days.   You may use over-the-counter medicine, such as acetaminophen or ibuprofen, to control pain and fever, unless something else was prescribed. If you have chronic liver or kidney disease or have ever had a stomach ulcer or gastrointestinal bleeding, talk with your healthcare provider before using these medicines. Do not give aspirin to anyone under 18 years of age who has a fever. It may cause severe illness or death.  Follow-up care  Follow up with your healthcare provider, or as advised, in 2 weeks if all symptoms have not gotten better, or if hearing doesn't go back to normal within 1 month.  When to seek medical advice  Call your healthcare provider right away if any of these occur:   Ear pain gets worse or does not improve after 3 days of treatment   Unusual drowsiness or confusion   Neck pain, stiff neck, or headache   Fluid or blood draining from the ear canal   Fever of 100.4F (38C) or as advised   Seizure  Date Last Reviewed: 03/04/2015   2000-2016 [employer] [address], [place] [address]. All rights reserved. This information is not intended as a substitute for professional medical care. Always follow your healthcare professional's instructions.

## 2016-03-09 NOTE — Progress Notes (Signed)
[facility]  PROGRESS NOTE     Patient: [name]   Date: 03/09/2016   MRN: [id number]       [name] is a 75 y.o. male      SUBJECTIVE     Chief Complaint   Patient presents with   . Otalgia     onset 1 week ago- right ear feels full, difficulty hearing, tried peroxide/water mix         Otalgia   There is pain in the right ear. The current episode started in the past 7 days. The problem has been gradually worsening. There has been no fever. Pertinent negatives include no coughing or headaches. He has tried ear drops for the symptoms.       Review of Systems   Constitutional: Negative for fever.   HENT: Positive for congestion and ear pain.    Respiratory: Negative for cough and wheezing.    Neurological: Negative for headaches.   All other systems reviewed and are negative.      The following portions of the patient's history were reviewed and updated as appropriate: Allergies, Current Medications, Past Family History, Past Medical history, Past social history, Past surgical history, and Problem List.    OBJECTIVE     Vitals   Filed Vitals:    03/09/16 1418   BP: 121/72   Pulse: 69   Temp: 97.3 F (36.3 C)   TempSrc: Oral   Resp: 16   Height: 1.854 m (6\' 1" )   Weight: 98.884 kg (218 lb)       Physical Exam   Nursing note and vitals reviewed.  Constitutional: He is oriented to person, place, and time. He appears well-developed and well-nourished. No distress.   HENT:   Head: Normocephalic and atraumatic.   Mouth/Throat: Oropharynx is clear and moist.   Right TM erythema   Neck: Normal range of motion. Neck supple. No JVD present.   Cardiovascular: Normal rate, regular rhythm and normal heart sounds.    Pulmonary/Chest: Effort normal and breath sounds normal. He has no wheezes. He has no rales.   Lymphadenopathy:     He has no cervical adenopathy.   Neurological: He is alert and oriented to person, place, and time.   Skin: No rash noted. He is not diaphoretic.       Lab Results  (24 Hour)   Results     ** No results found for the last 24 hours. **          Radiology Results (24 Hour)     ** No results found for the last 24 hours. **          ASSESSMENT     Encounter Diagnosis   Name Primary?   . Right acute serous otitis media, recurrence not specified Yes          PLAN     Procedures    1. Right acute serous otitis media, recurrence not specified  - amoxicillin-clavulanate (AUGMENTIN) 875-125 MG per tablet; Take 1 tablet by mouth 2 (two) times daily.  Dispense: 20 tablet; Refill: 0  motrin as needed for pain  F/u with pcp in 5 days    An After Visit Summary was printed and given to the patient.      Signed,  Marylee Floras, MD  03/09/2016

## 2016-03-21 ENCOUNTER — Ambulatory Visit: Payer: Medicare (Managed Care) | Attending: Orthopaedic Surgery

## 2016-03-22 ENCOUNTER — Ambulatory Visit
Admission: RE | Admit: 2016-03-22 | Discharge: 2016-03-22 | Disposition: A | Discharge status: Home / Self Care | Payer: Medicare (Managed Care) | Source: Ambulatory Visit | Attending: Orthopaedic Surgery | Admitting: Orthopaedic Surgery

## 2016-03-22 ENCOUNTER — Ambulatory Visit: Payer: Medicare (Managed Care) | Admitting: Pain Medicine

## 2016-03-22 ENCOUNTER — Other Ambulatory Visit: Payer: Self-pay

## 2016-03-22 ENCOUNTER — Ambulatory Visit: Payer: Medicare (Managed Care) | Admitting: Orthopaedic Surgery

## 2016-03-22 ENCOUNTER — Encounter
Admission: RE | Disposition: A | Discharge status: Home / Self Care | Payer: Self-pay | Source: Ambulatory Visit | Attending: Orthopaedic Surgery

## 2016-03-22 DIAGNOSIS — S83242A Other tear of medial meniscus, current injury, left knee, initial encounter: Secondary | ICD-10-CM | POA: Insufficient documentation

## 2016-03-22 DIAGNOSIS — G4730 Sleep apnea, unspecified: Secondary | ICD-10-CM | POA: Insufficient documentation

## 2016-03-22 DIAGNOSIS — Z86718 Personal history of other venous thrombosis and embolism: Secondary | ICD-10-CM | POA: Insufficient documentation

## 2016-03-22 DIAGNOSIS — S83272A Complex tear of lateral meniscus, current injury, left knee, initial encounter: Secondary | ICD-10-CM | POA: Insufficient documentation

## 2016-03-22 DIAGNOSIS — E785 Hyperlipidemia, unspecified: Secondary | ICD-10-CM | POA: Insufficient documentation

## 2016-03-22 DIAGNOSIS — M7122 Synovial cyst of popliteal space [Baker], left knee: Secondary | ICD-10-CM | POA: Insufficient documentation

## 2016-03-22 DIAGNOSIS — X58XXXA Exposure to other specified factors, initial encounter: Secondary | ICD-10-CM | POA: Insufficient documentation

## 2016-03-22 HISTORY — PX: ARTHROSCOPY, KNEE: SHX3174

## 2016-03-22 SURGERY — ARTHROSCOPY, KNEE
Anesthesia: Anesthesia General | Site: Knee | Laterality: Left | Wound class: Clean

## 2016-03-22 MED ORDER — LIDOCAINE HCL 2 % IJ SOLN
INTRAMUSCULAR | Status: AC
Start: 2016-03-22 — End: ?
  Filled 2016-03-22: qty 20

## 2016-03-22 MED ORDER — MIDAZOLAM HCL 2 MG/2ML IJ SOLN
INTRAMUSCULAR | Status: AC
Start: 2016-03-22 — End: ?
  Filled 2016-03-22: qty 2

## 2016-03-22 MED ORDER — FENTANYL CITRATE (PF) 50 MCG/ML IJ SOLN (WRAP)
25.0000 ug | INTRAMUSCULAR | Status: DC | PRN
Start: 2016-03-22 — End: 2016-03-22

## 2016-03-22 MED ORDER — LIDOCAINE-EPINEPHRINE 1 %-1:100000 IJ SOLN
INTRAMUSCULAR | Status: DC | PRN
Start: 2016-03-22 — End: 2016-03-22
  Administered 2016-03-22: 30 mL

## 2016-03-22 MED ORDER — PROPOFOL 10 MG/ML IV EMUL (WRAP)
INTRAVENOUS | Status: AC
Start: 2016-03-22 — End: ?
  Filled 2016-03-22: qty 20

## 2016-03-22 MED ORDER — EPINEPHRINE HCL 1 MG/ML IJ SOLN
INTRAMUSCULAR | Status: AC
Start: 2016-03-22 — End: ?
  Filled 2016-03-22: qty 1

## 2016-03-22 MED ORDER — OXYCODONE-ACETAMINOPHEN 5-325 MG PO TABS
1.0000 | ORAL_TABLET | Freq: Once | ORAL | Status: DC | PRN
Start: 2016-03-22 — End: 2016-03-22

## 2016-03-22 MED ORDER — DEXTROSE 5 % IV SOLN
2.0000 g | Freq: Once | INTRAVENOUS | Status: DC
Start: 2016-03-22 — End: 2016-03-22

## 2016-03-22 MED ORDER — ONDANSETRON HCL 4 MG/2ML IJ SOLN
INTRAMUSCULAR | Status: DC | PRN
Start: 2016-03-22 — End: 2016-03-22
  Administered 2016-03-22: 4 mg via INTRAVENOUS

## 2016-03-22 MED ORDER — FENTANYL CITRATE (PF) 50 MCG/ML IJ SOLN (WRAP)
INTRAMUSCULAR | Status: AC
Start: 2016-03-22 — End: ?
  Filled 2016-03-22: qty 2

## 2016-03-22 MED ORDER — ONDANSETRON HCL 4 MG/2ML IJ SOLN
INTRAMUSCULAR | Status: AC
Start: 2016-03-22 — End: ?
  Filled 2016-03-22: qty 2

## 2016-03-22 MED ORDER — PROPOFOL 10 MG/ML IV EMUL (WRAP)
INTRAVENOUS | Status: DC | PRN
Start: 2016-03-22 — End: 2016-03-22
  Administered 2016-03-22: 150 mg via INTRAVENOUS

## 2016-03-22 MED ORDER — ONDANSETRON HCL 4 MG/2ML IJ SOLN
4.0000 mg | Freq: Once | INTRAMUSCULAR | Status: DC | PRN
Start: 2016-03-22 — End: 2016-03-22

## 2016-03-22 MED ORDER — LIDOCAINE-EPINEPHRINE 1 %-1:100000 IJ SOLN
INTRAMUSCULAR | Status: AC
Start: 2016-03-22 — End: ?
  Filled 2016-03-22: qty 20

## 2016-03-22 MED ORDER — MIDAZOLAM HCL 2 MG/2ML IJ SOLN
INTRAMUSCULAR | Status: DC | PRN
Start: 2016-03-22 — End: 2016-03-22
  Administered 2016-03-22: 1 mg via INTRAVENOUS

## 2016-03-22 MED ORDER — HYDROCODONE-ACETAMINOPHEN 5-325 MG PO TABS
ORAL_TABLET | ORAL | 0 refills | Status: DC
Start: 2016-03-22 — End: 2016-06-22
  Filled 2016-03-22: qty 40, 7d supply, fill #0

## 2016-03-22 MED ORDER — LACTATED RINGERS IR SOLN
Status: DC | PRN
Start: 2016-03-22 — End: 2016-03-22
  Administered 2016-03-22: 3000 mL

## 2016-03-22 MED ORDER — LACTATED RINGERS IV SOLN
INTRAVENOUS | Status: DC
Start: 2016-03-22 — End: 2016-03-22

## 2016-03-22 MED ORDER — PROMETHAZINE HCL 25 MG/ML IJ SOLN
6.2500 mg | Freq: Once | INTRAMUSCULAR | Status: DC | PRN
Start: 2016-03-22 — End: 2016-03-22

## 2016-03-22 MED ORDER — DEXTROSE 5 % IV SOLN
INTRAVENOUS | Status: AC
Start: 2016-03-22 — End: ?
  Filled 2016-03-22: qty 2000

## 2016-03-22 MED ORDER — MEPERIDINE HCL 25 MG/ML IJ SOLN
12.5000 mg | Freq: Once | INTRAMUSCULAR | Status: DC | PRN
Start: 2016-03-22 — End: 2016-03-22

## 2016-03-22 MED ORDER — HYDROMORPHONE HCL 1 MG/ML IJ SOLN
0.4000 mg | INTRAMUSCULAR | Status: DC | PRN
Start: 2016-03-22 — End: 2016-03-22

## 2016-03-22 MED ORDER — LIDOCAINE HCL 2 % IJ SOLN
INTRAMUSCULAR | Status: DC | PRN
Start: 2016-03-22 — End: 2016-03-22
  Administered 2016-03-22: 60 mg

## 2016-03-22 MED ORDER — SODIUM CHLORIDE 0.9 % IV SOLN
INTRAVENOUS | Status: DC
Start: 2016-03-22 — End: 2016-03-22

## 2016-03-22 MED ORDER — FENTANYL CITRATE (PF) 50 MCG/ML IJ SOLN (WRAP)
INTRAMUSCULAR | Status: DC | PRN
Start: 2016-03-22 — End: 2016-03-22
  Administered 2016-03-22 (×2): 25 ug via INTRAVENOUS
  Administered 2016-03-22: 50 ug via INTRAVENOUS

## 2016-03-22 MED ORDER — EPINEPHRINE HCL 1 MG/ML IJ SOLN (WRAP)
Status: DC | PRN
Start: 2016-03-22 — End: 2016-03-22
  Administered 2016-03-22: 15:00:00 1 mg

## 2016-03-22 MED ORDER — SODIUM CHLORIDE 0.9 % IR SOLN
Status: DC | PRN
Start: 2016-03-22 — End: 2016-03-22
  Administered 2016-03-22: 1000 mL

## 2016-03-22 MED ORDER — SODIUM CHLORIDE 0.9 % IV MBP
1.0000 g | Freq: Once | INTRAVENOUS | Status: DC
Start: 2016-03-22 — End: 2016-03-22
  Administered 2016-03-22: 2 g via INTRAVENOUS

## 2016-03-22 SURGICAL SUPPLY — 29 items
APPLCATOR CHLORAPREP 26ML (Prep) ×2 IMPLANT
BANDAGE KERLIX MEDIUM GAUZE L3.6 YD X (Dressing) ×1 IMPLANT
BLADE CLIPPER ASSEMBLY (Procedure Accessories) IMPLANT
BLADE SHAVER TOMCAT 4.0MM (Ortho Supply) ×2 IMPLANT
BNDG KRLX GZE 3.6YDX6.4IN MED CTTN 6 PLY (Dressing) ×1
CLOTH BEACON TIMEOUT ORANGE (Other) ×1 IMPLANT
DRAPE 3/4 SHEET FANFLD 52X76IN (Drape) ×2 IMPLANT
DRAPE SRG PLS U STRDRP 51X47IN LF STRL (Drape) ×1
DRAPE SURGICAL ADHESIVE L51 IN X W47 IN (Drape) ×1
DRAPE SURGICAL ADHESIVE L51 IN X W47 IN STERI-DRAPE CLEAR (Drape) ×1 IMPLANT
DRESSING PETRO 3% BI 3BRM GZE XR 8X1IN (Dressing) ×1
DRESSING PETROLATUM XEROFORM L8 IN X W1 (Dressing) ×1
DRESSING PETROLATUM XEROFORM L8 IN X W1 IN 3% BISMUTH TRIBROMOPHENATE (Dressing) ×1 IMPLANT
GLOVE SRG PLISPRN 6.5 BGL PI ULTRATOUCH (Glove) ×1
GLOVE SURG BIOGEL INDIC SZ 6.5 (Glove) ×2 IMPLANT
GLOVE SURG BIOGEL INDIC SZ 8.5 (Glove) ×4 IMPLANT
GLOVE SURG BIOGEL ORTHO SZ8.5 (Glove) ×4 IMPLANT
GLOVE SURGICAL 6 1/2 BIOGEL PI (Glove) ×1
GLOVE SURGICAL 6 1/2 BIOGEL PI ULTRATOUCH G POWDER FREE BEAD CUFF (Glove) ×1 IMPLANT
JUG OMNI 15000CC W SPECIMEN CA (Suction) ×4 IMPLANT
KIT INFECTION CONTROL CUSTOM (Kits) ×2
KIT INFECTION CONTROL CUSTOM IFOH03 (Kits) ×1 IMPLANT
SUTURE NABSB 3-0 FS1 PRLN 18IN MFL BLU (Suture) ×1
SUTURE PROLENE BLUE 3-0 FS-1 L18 IN (Suture) ×1
SUTURE PROLENE BLUE 3-0 FS-1 L18 IN MONOFILAMENT NONABSORBABLE (Suture) ×1 IMPLANT
TRAY KNEE ARTHROSCOPY (Tray) ×2 IMPLANT
TUBING FLOCNTRL ARTHRSCPY PUMP (Ortho Supply) ×2 IMPLANT
TUBING SUCTION 3/16X10 (Tubing) ×2 IMPLANT
WAND ACCEL ENERGY SERFAS 90-S (Procedure Accessories) ×2 IMPLANT

## 2016-03-22 NOTE — Op Note (Signed)
Procedure Date: 03/22/2016     Patient Type: A     SURGEON: [name] MD  ASSISTANT:  Marvia Pickles PA     PREOPERATIVE DIAGNOSIS:  Medial meniscal tear, left knee.     POSTOPERATIVE DIAGNOSIS:  Medial and lateral meniscal tears, left knee.     TITLE OF PROCEDURE:  Arthroscopic partial medial and lateral meniscectomies, left knee.      ANESTHETIC:  General.     BLOOD LOSS:  Minimal.     FINDINGS:  The patient had a complex tear of the posterior horn and body of the  lateral meniscus.  There was also a posterior-horn tear of the medial  meniscus, with associated grade 4 articular cartilage changes.  Finally, he  had a large Baker cyst, from which we aspirated 90 mL of clear  straw-colored fluid.     DESCRIPTION OF PROCEDURE:  After properly identifying the patient, he was given a gram of Ancef  intravenously.  He was then brought to the operating room, placed in supine  position on the operative table.  Once there, he was anesthetized with an  excellent general anesthetic.  The left lower extremity was prepped and  draped in the usual sterile manner.  A surgical pause by the operative team  resulted in all members agreeing to the procedure, site, and patient.   Previous arthroscopic portals were used, both medial and lateral to the  patellar tendon anteriorly, as well as a medial suprapatellar stab for the  egress of the saline.  We inspected the joint with a 30-degree lens and  closed circuit camera system.  All articular and meniscal surfaces were  probed, as was the anterior cruciate ligament.  In addition, we inspected  the medial and lateral gutters and the suprapatellar pouch for loose  bodies.       Starting with the knee in extension, we inspected the patellofemoral joint,  which appeared normal for age.  I then swept along the lateral gutter of  the knee and into the lateral compartment, where we found extensive tearing  of the posterior horn of the lateral meniscus.  This extended into the  body.   There were associated cartilaginous changes of the lateral tibial  condylar surface.  Hand instruments, suction shaver, and radiotherapy wand  were also used to treat the meniscal tear.  We also used the radiotherapy  wand to smooth out the articular cartilage damage on the tibia.  Mr. Jeannetta Nap  then took the knee out a figure-of-four position, placed it in 90 degrees  of flexion, so I could inspect the notch of the knee, where we found the  anterior cruciate ligament to be intact.  The stress post was then elevated  and locked in place on the side of the operative table, and as Mr. Jeannetta Nap  applied the valgus stress in varying degrees of flexion, I entered the  medial side of the knee.  There, we found primarily articular cartilage  wear changes, including areas of grade 4 loss.  There was an area in the  posterior horn-body junction of the medial meniscus which was torn, which  we smoothed with the radiotherapy wand.  We also used the wand to perform a  chondroplasty.       At this point, we brought the knee into extension.  We irrigated the joint  until the effluent was clear.  I used a spinal needle and a syringe to  aspirate this palpable Baker cyst,  obtaining 90 mL.  We then closed the  portals with simple sutures.  The knee was anesthetized with a total of 20  mL of 1% lidocaine with epinephrine.  Sterile dressings were applied,  followed by an Ace wrap.  At this point, the toes were visible.  They were  pink and warm, with less than 3-second capillary refill, so the patient was  transferred to the gurney, where he was awakened and transferred on then to  the recovery room in stable, satisfactory, and awake condition.  Final  sponge and needle counts were reported as correct.     NEED FOR ASSISTANCE:  Mr. Jeannetta Nap' assistance was essential in providing exposure, which he did by  (1) proper positioning of the limb and (2) controlling inflow and outflow.   He was also present for wound closure and dressing  application.           D:  03/22/2016 15:34 PM by Dr. [name], MD [phone])  T:  03/22/2016 23:24 PM by [id number]      Everlean Cherry: [id number]) (Doc ID: [id number])

## 2016-03-22 NOTE — Discharge Instructions (Addendum)
Post-Operative Instructions for Knee Arthroscopy/ACL Reconstruction    [name], M.D.    [phone]      Use cane or crutches as needed for security and comfort.    Weight bearing as tolerated, unless specified otherwise; pain and swelling are your activity guides.    Begin exercises as soon as possible (3 sets of 10, 4-5 times a day)  Quad Sets - Straighten your knee, tense the quad muscle, and push the back of the knee into the bed; hold for 5 seconds, then relax for 5 seconds.  Straight Leg Raises - Tighten your thigh muscle and lift your leg ~24 inches, keeping your leg straight and thigh muscle tight; hold for 5 seconds, then slowly lower.  Knee Flexion - Bend the knee as much as the dressing will allow.  Ankle Pumps - Point and flex the foot from the ankle to promote blood flow.    No sports, running, excessive stair climbing, squatting, or jumping.    5.   Elevate the leg (from under the heel and ankle only) and apply an ice pack for 20     minutes every hour as needed to help reduce pain and swelling.  Keep the dressing and incisions dry at all times.  Small amounts of drainage on the dressing are normal and may be reinforced with a 4x4 gauze and Ace wrap.  You may loosen the Ace wrap if it feels too tight.    Remove the dressing 3 days after surgery.  You may shower at this point; no baths, hot tubs, or swimming.  After the shower, cover each incision with band-aids and change daily.  If you have steri-strips stuck to the skin over your incisions leave them in place.    7.   Take the following medication as directed: Aspirin 325 mg BID x 14 days        To minimize gastric upset from narcotics or anti-inflammatory medicine, eat adequate amounts of food.    Make a postoperative appointment with my office within 10-14 days after surgery by calling [phone].    If you develop severe pain, redness, swelling in the leg, difficulty breathing, or fever greater than 101.13F call my  office.      Anesthesia: After Your Surgery  You've just had surgery. During surgery, you received medication called anesthesia to keep you comfortable and pain-free. After surgery, you may experience some pain or nausea. This is normal. Here are some tips for feeling better and recovering after surgery.     Stay on schedule with your medication.   Going Home  Your doctor or nurse will show you how to take care of yourself when you go home. He or she will also answer your questions. Have an adult family member or friend drive you home. For the first 24 hours after your surgery:   Do not drive or use heavy equipment.   Do not make important decisions or sign documents.   Avoid alcohol.   Have someone stay with you, if possible. They can watch for problems and help keep you safe.  Be sure to keep all follow-up doctor's appointments. And rest after your procedure for as long as your doctor tells you to.    Coping with Pain  If you have pain after surgery, pain medication will help you feel better. Take it as directed, before pain becomes severe. Also, ask your doctor or pharmacist about other ways to control pain, such as with heat, ice,  and relaxation. And follow any other instructions your surgeon or nurse gives you.    Tips for Taking Pain Medication  To get the best relief possible, remember these points:   Pain medications can upset your stomach. Taking them with a little food may help.   Most pain relievers taken by mouth need at least 20 to 30 minutes to take effect.   Taking medication on a schedule can help you remember to take it. Try to time your medication so that you can take it before beginning an activity, such as dressing, walking, or sitting down for dinner.   Constipation is a common side effect of pain medications. Drink lots of fluids. Eating fruit and vegetables can also help. Don't take laxatives unless your surgeon has prescribed them.   Mixing alcohol and pain medication can cause  dizziness and slow your breathing. It can even be fatal. Don't drink alcohol while taking pain medication.   Pain medication can slow your reflexes. Don't drive or operate machinery while taking pain medication,    Managing Nausea  Some people have an upset stomach after surgery. This is often due to anesthesia, pain, pain medications, or the stress of surgery. The following tips will help you manage nausea and get good nutrition as you recover. If you were on a special diet before surgery, ask your doctor if you should follow it during recovery. These tips may help:   Don't push yourself to eat. Your body will tell you what to eat and when.   Start off with liquids and soup. They are easier to digest.   Progress to semisolids (mashed potatoes, applesauce, and gelatin) as you feel ready.   Slowly move to solid foods. Don't eat fatty, rich, or spicy foods at first.   Don't force yourself to have three large meals a day. Instead, eat smaller amounts more often.   Take pain medications with a small amount of solid food, such as crackers or toast.    Important:   Prevent blood clots by wiggling your toes and tightening your calf muscle 20 times every hour while awake until you resume normal walking activity.   If unable to urinate in 6-8 hours, call your surgeon or go to the Emergency Department.     Call Your Surgeon If.   You still have pain an hour after taking medication (it may not be strong enough).   You feel too sleepy, dizzy, or groggy (medication may be too strong).   You have side effects like nausea, vomiting, or skin changes (rash, itching, or hives).   Signs of infection - fever over 101, chills. Incision site redness, warmth, swelling, foul odor or purulent drainage.   Persistent bleeding at the operative site.    [address] [place], [place] [address]. All rights reserved. This information is not intended as a substitute for professional medical care. Always  follow your healthcare professional's instructions.

## 2016-03-22 NOTE — Anesthesia Preprocedure Evaluation (Addendum)
Anesthesia Evaluation    AIRWAY    Mallampati: II    TM distance: >3 FB  Neck ROM: full  Mouth Opening:full   CARDIOVASCULAR    cardiovascular exam normal, regular and normal       DENTAL    no notable dental hx     PULMONARY    pulmonary exam normal and clear to auscultation     OTHER FINDINGS    NL HCT PLT CR  EKG SR    Facial hair -  moustache                Anesthesia Plan    ASA 3     general               (Risks discussed including but not limited to:  neurological complications such as stroke,   cardiovascular complications such as heart attack,   pulmonary complications such as asthmatic attack,   intra-operative awareness, death, dental trauma, and allergic reaction.     Questions answered.     Pt understands and wishes to proceed.  Pt Seen, consented preop.     Martie Lee, MD  )      intravenous induction   Detailed anesthesia plan: general LMA        Post op pain management: per surgeon    informed consent obtained    Plan discussed with CRNA.    ECG reviewed  pertinent labs reviewed

## 2016-03-22 NOTE — Anesthesia Postprocedure Evaluation (Signed)
Anesthesia Post Evaluation    Patient name:  [name] [facility]    Procedures performed: Procedure(s) with comments:  ARTHROSCOPY, KNEE - LEFT KNEE PARTIAL MEDIAL AND LATERAL MENISCECTOMY      Anesthesia type: General LMA      Patient location:Phase I PACU  Filed Vitals:    03/22/16 1540   BP:    Pulse: 64   Temp:    Resp:    SpO2: 99%           Post pain: Patient not complaining of pain, continue current therapy     Mental Status:awake    Respiratory Function: tolerating room air    Cardiovascular: stable    Nausea/Vomiting: patient not complaining of nausea or vomiting    Hydration Status: adequate    Post assessment: no apparent anesthetic complications

## 2016-03-22 NOTE — Transfer of Care (Signed)
Anesthesia Transfer of Care Note    Patient: Ural Acree [facility]    Procedures performed: Procedure(s) with comments:  ARTHROSCOPY, KNEE - LEFT KNEE PARTIAL MEDIAL AND LATERAL MENISCECTOMY    Anesthesia type: General LMA    Patient location:Phase I PACU    Last vitals:   Filed Vitals:    03/22/16 1538   BP:    Pulse:    Temp: 36.3 C (97.3 F)   Resp:    SpO2:        Post pain: Patient not complaining of pain, continue current therapy      Mental Status:awake    Respiratory Function: tolerating face mask    Cardiovascular: stable    Nausea/Vomiting: patient not complaining of nausea or vomiting    Hydration Status: adequate    Post assessment: no apparent anesthetic complications    Signed by: Mal Misty III  03/22/2016 3:39 PM

## 2016-03-22 NOTE — Progress Notes (Signed)
LLE warm, cap refill less than 3 seconds, able to wiggle toes easily.

## 2016-03-22 NOTE — H&P (Signed)
PRE-OP HISTORY AND PHYSICAL EXAM    Date Time: 03/22/2016 2:25 PM  Patient Name: [name]  Attending Physician: [name], MD    Assessment:   Left knee medial meniscal tear     Plan:   Left knee arthroscopic partial medial meniscectomy     History of Present Illness:   [name] is a 75 y.o. male who presents to the hospital for left knee arthroscopic medial meniscectomy. He has failed conservative therapy and is ready to proceed with the operation. He understands the risks and benefits of the procedure.     Past Medical History:     Past Medical History   Diagnosis Date   . Hyperlipidemia    . Sleep apnea      documented w/ CPAP   . Restless leg syndrome    . Sciatica    . Peripheral vascular disease 2016   . Deep venous thrombosis of distal lower extremity 2016     last fall subnormal clot in leg   . 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 Fracture 2013     left fibula   . Ear, nose and throat disorder 2016     rhinitis (occasionally severe)       Past Surgical History:     Past Surgical History   Procedure Laterality Date   . Colonoscopy     . Knee arthroscopy w/ meniscal repair Left 2012   . Ankle fracture surgery  2013   . Hernia repair     . Cystoscopy, turp N/A 04/23/2015     Procedure: CYSTOSCOPY, TURP, BLADDER BIOPSY;  Surgeon: Jobe Gibbon, MD;  Location: [facility] OR;  Service: Urology;  Laterality: N/A;  CYSTOSCOPY, CLOT EVACUATION,  TURP, BLADDER BIOPSY w/FULGERATION   . Prostate surgery  04/2015   . Cosmetic surgery       eye lift   . Skin biopsy     . Fracture surgery  2013       Family History:   History reviewed. No pertinent family history.    Social History:     Social History     Social History   . Marital Status: Married     Spouse Name: N/A   . Number of Children: N/A   . Years of Education: N/A     Social History Main Topics   . Smoking status: Former Smoker -- 0.25 packs/day for 25 years     Quit date: 03/21/1978    . Smokeless tobacco: Never Used   . Alcohol Use: 4.2 oz/week     7 Glasses of wine per week   . Drug Use: No   . Sexual Activity: Not on file     Other Topics Concern   . Not on file     Social History Narrative       Allergies:   No Known Allergies    Medications:     Prescriptions prior to admission   Medication Sig   . atorvastatin (LIPITOR) 10 MG tablet Take 10 mg by mouth daily.   Marland Kitchen gabapentin (NEURONTIN) 300 MG capsule Take 300 mg by mouth 3 (three) times daily.   . rotigotine (NEUPRO) 6 MG/24HR patch    . zolpidem (AMBIEN CR) 12.5 MG CR tablet Take by mouth.       Review of Systems:   No chest pain, shortness of breath, dizziness,  headache.    Physical Exam:     Filed Vitals:    03/22/16 1235   BP: 141/85   Pulse: 63   Temp: 97.3 F (36.3 C)   Resp: 18   SpO2: 98%       General appearance - alert, well appearing, and in no distress  Cardiovascular- Regular rate and rhythm  Pulmonary- clear to auscultation, no wheezing, no retraction, no stridor, good air exchange.  Musculoskeletal - abnormal exam of left knee. + MJLT. + McMurrays. Pain with deep flexion. Full ROM. NVI. SLR intact.   Extremities - peripheral pulses normal, no pedal edema, no clubbing or cyanosis    Labs:     Results     ** No results found for the last 24 hours. **          Rads:   Radiological Procedure reviewed.    Signed by: Santiago Bumpers

## 2016-03-23 ENCOUNTER — Encounter: Payer: Self-pay | Admitting: Orthopaedic Surgery

## 2016-04-26 ENCOUNTER — Other Ambulatory Visit: Payer: Self-pay | Admitting: Specialist

## 2016-06-22 ENCOUNTER — Emergency Department: Payer: Medicare (Managed Care)

## 2016-06-22 ENCOUNTER — Emergency Department
Admission: EM | Admit: 2016-06-22 | Discharge: 2016-06-22 | Disposition: A | Discharge status: Home / Self Care | Payer: Medicare (Managed Care) | Attending: Emergency Medical Services | Admitting: Emergency Medical Services

## 2016-06-22 DIAGNOSIS — W260XXA Contact with knife, initial encounter: Secondary | ICD-10-CM | POA: Insufficient documentation

## 2016-06-22 DIAGNOSIS — E785 Hyperlipidemia, unspecified: Secondary | ICD-10-CM | POA: Insufficient documentation

## 2016-06-22 DIAGNOSIS — Z86718 Personal history of other venous thrombosis and embolism: Secondary | ICD-10-CM | POA: Insufficient documentation

## 2016-06-22 DIAGNOSIS — Z87891 Personal history of nicotine dependence: Secondary | ICD-10-CM | POA: Insufficient documentation

## 2016-06-22 DIAGNOSIS — I739 Peripheral vascular disease, unspecified: Secondary | ICD-10-CM | POA: Insufficient documentation

## 2016-06-22 DIAGNOSIS — S91312A Laceration without foreign body, left foot, initial encounter: Secondary | ICD-10-CM | POA: Insufficient documentation

## 2016-06-22 MED ORDER — CEPHALEXIN 500 MG PO CAPS
500.0000 mg | ORAL_CAPSULE | Freq: Three times a day (TID) | ORAL | 0 refills | Status: AC
Start: 2016-06-22 — End: 2016-06-27

## 2016-06-22 MED ORDER — CEPHALEXIN 500 MG PO CAPS
500.0000 mg | ORAL_CAPSULE | Freq: Once | ORAL | Status: AC
Start: 2016-06-22 — End: 2016-06-22
  Administered 2016-06-22: 500 mg via ORAL
  Filled 2016-06-22: qty 1

## 2016-06-22 NOTE — ED Triage Notes (Signed)
With puncture wound after dropping a knife to his left foot. No active bleeding at this time. TT up to date.

## 2016-06-22 NOTE — ED Provider Notes (Signed)
Physician/Midlevel provider first contact with patient: 06/22/16 1837         History     Chief Complaint   Patient presents with   . Laceration     Pt with hx HLD, OSA, RLS, sciatica, PVD, DVT, varicose vein, presents with puncture wound to left dorsal foot x 2 hours ago after a box cutter fell onto his foot and cut through his sandal. Pt reports pain and bleeding initially which has resolved now. Pt came for wound closure. Pt denies pain radiation, numbness, tingling, weakness, toe pain, drainage, ankle pain. Tetanus UTD.                  Past Medical History:   Diagnosis Date   . Deep venous thrombosis of distal lower extremity 2016    last fall subnormal clot in leg   . Ear, nose and throat disorder 2016    rhinitis (occasionally severe)   . Fracture 2013    left fibula   . Hyperlipidemia    . 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 Peripheral vascular disease 2016   . Restless leg syndrome    . Sciatica    . Sleep apnea     documented w/ CPAP   . 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       Past Surgical History:   Procedure Laterality Date   . ANKLE FRACTURE SURGERY  2013   . ARTHROSCOPY, KNEE Left 03/22/2016    Procedure: ARTHROSCOPY, KNEE;  Surgeon: [name], MD;  Location: [facility];  Service: Orthopedics;  Laterality: Left;  LEFT KNEE PARTIAL MEDIAL AND LATERAL MENISCECTOMY   . COLONOSCOPY     . COSMETIC SURGERY      eye lift   . CYSTOSCOPY, TURP N/A 04/23/2015    Procedure: CYSTOSCOPY, TURP, BLADDER BIOPSY;  Surgeon: Jobe Gibbon, MD;  Location: [facility] OR;  Service: Urology;  Laterality: N/A;  CYSTOSCOPY, CLOT EVACUATION,  TURP, BLADDER BIOPSY w/FULGERATION   . FRACTURE SURGERY  2013   . HERNIA REPAIR     . KNEE ARTHROSCOPY W/ MENISCAL REPAIR Left 2012   . PROSTATE SURGERY  04/2015   . SKIN BIOPSY         History reviewed. No pertinent family history.    Social  Social History   Substance Use Topics   . Smoking status: Former Smoker     Packs/day: 0.25     Years: 25.00     Quit date:  03/21/1978   . Smokeless tobacco: Never Used   . Alcohol use 4.2 oz/week     7 Glasses of wine per week       .     No Known Allergies    Home Medications     Med List Status:  In Progress Set By: Angus Palms, RN at 06/22/2016  6:31 PM                atorvastatin (LIPITOR) 10 MG tablet     Take 10 mg by mouth daily.     gabapentin (NEURONTIN) 300 MG capsule     Take 300 mg by mouth 3 (three) times daily.     rotigotine (NEUPRO) 6 MG/24HR patch          zolpidem (AMBIEN CR) 12.5 MG CR tablet     Take by mouth.  Review of Systems   All other systems reviewed and are negative.      Physical Exam    BP: 138/72, Heart Rate: 78, Temp: 97.6 F (36.4 C), Resp Rate: 18, SpO2: 96 %, Weight: 95.3 kg    Physical Exam   Constitutional: He appears well-developed and well-nourished. No distress.   HENT:   Head: Normocephalic and atraumatic.   Right Ear: External ear normal.   Left Ear: External ear normal.   Nose: Nose normal.   Neck: Normal range of motion. Neck supple.   Pulmonary/Chest: Effort normal.   Musculoskeletal: Normal range of motion.        Left foot: There is tenderness (mild tenderness around laceration) and laceration (1cm linear laceration in 4th webspace of forefoot, no active bleeding, clean appearing, no bone or tendon exposed). There is normal range of motion, no bony tenderness, no swelling, normal capillary refill, no crepitus and no deformity.        Feet:    Normal touch sensation to all toes   Neurological: He is alert.   Skin: Skin is warm and dry.   Psychiatric: He has a normal mood and affect. His behavior is normal.   Nursing note and vitals reviewed.        MDM and ED Course     ED Medication Orders     Start Ordered     Status Ordering Provider    06/22/16 1902 06/22/16 1901  cephalexin (KEFLEX) capsule 500 mg  Once     Route: Oral  Ordered Dose: 500 mg     Last MAR action:  Given Analuisa Tudor L             MDM  Number of Diagnoses or Management Options  Foot  laceration, left, initial encounter:   Diagnosis management comments:   Injury between 4th and 5th toes/metatarsals, low suspicion for fracture given mechanism of injury and location. Discussed ordering xray to evaluate for retained FBs however pt would like to defer imaging at this time. Will cover with Keflex.     Laceration Repair   Verbal consent obtained  Performed by Josph Macho, PA-C  Usual prep and drape including sterile gloves and sterile drape  Tetanus status: yes  Anesthesia: none  Wound inspected using sterile technique  No foreign body seen or palpated  No contamination  Deep structures intact  No bony deformity  No tendon involvement  Wound irrigated with NS  Location: left dorsal foot  Simple or complex: simple  Length: 1cm  Type of suture/staples: steri strip  Number of sutures/staples: 1  Wound well approximated: Yes  Neurovascular status after procedure: Normal  Patient tolerated procedure: Well  Complications: None    Re-evaluation prior to discharge:   Pt looks well, declining post-op shoe. Reviewed all blood work and/or urine and imaging results with patient and/or family, including abnormal findings. Discussed treatment and need for close outpatient follow up with their PMD and/or referrals provided today to discuss these findings. All questions and concerns addressed. Strict return precautions given. Pt and/or family fully understand(s) and agrees to tx plan.          ED Course              Procedures    Clinical Impression & Disposition     Clinical Impression  Final diagnoses:   Foot laceration, left, initial encounter        ED Disposition     ED Disposition Condition Date/Time Comment  Discharge  Wed Jun 22, 2016  7:13 PM [name] [facility] discharge to home/self care.    Condition at disposition: Stable           Discharge Medication List as of 06/22/2016  7:13 PM      START taking these medications    Details   cephalexin (KEFLEX) 500 MG capsule Take 1 capsule (500 mg total)  by mouth 3 (three) times daily.for 5 days, Starting Wed 06/22/2016, Until Mon 06/27/2016, Print                       Christene Slates, [place]  06/23/16 1343

## 2016-06-22 NOTE — Discharge Instructions (Signed)
Dear [name],    You were seen today by Josph Macho, PA-C. Thank you for choosing the [facility] for your healthcare needs.  We hope your visit today was EXCELLENT.    KEEP WOUND DRY FOR 24 HOURS. AFTER 24 HOURS, YOU CAN BATH WITH NORMAL SOAP AND WATER BUT DO NOT SUBMERGE UNDER WATER UNTIL AFTER YOUR STERI STRIP FALLS OFF ON IT'S OWN. TAKE AND FINISH ALL ANTIBIOTICS AS PRESCRIBED. RETURN TO THE ER IF YOU DEVELOP WORSENING SYMPTOMS, WORSENING PAIN, REDNESS, SWELLING, PUS DRAINING FROM YOUR WOUND, RED STREAKING IN YOUR SKIN, FEVER, OR NEW CONCERNING SYMPTOMS.     Please take any medications prescribed as directed.     If you have any questions or concerns, I am available at [phone]. Please do not hesitate to contact me if I can be of assistance.     Below is some information and resources that our patients often find helpful.    Sincerely,    Josph Macho, Physician Assistant  [facility]    ________________________________________________________________  Thank you for choosing [facility] for your emergency care needs.  We strive to provide EXCELLENT care to you and your family.      If you do not continue to improve, your condition worsens, or you develop new concerning symptoms please contact your doctor or return immediately to the Emergency Department.    DOCTOR REFERRALS  Call [phone] (available 24 hours a day, 7 days a week) if you need any further referrals and we can help you find a primary care doctor or specialist.  Also, available online at:  [email or web address]    YOUR CONTACT INFORMATION  Before leaving please check with registration to make sure we have an up-to-date contact number.  You can call registration at [phone] to update your information.  For questions about your hospital bill, please call [phone].  For questions about your Emergency Dept Physician  bill please call [phone].      FREE HEALTH SERVICES  If you need help with health or social services, please call 2-1-1 for a free referral to resources in your area.  2-1-1 is a free service connecting people with information on health insurance, free clinics, pregnancy, mental health, dental care, food assistance, housing, and substance abuse counseling.  Also, available online at:  http://[email or web address].org    MEDICAL RECORDS AND TESTS  Certain laboratory test results do not come back the same day, for example urine cultures.  We will contact you if other important findings are noted. Radiology films are often reviewed again to ensure accuracy.  If there is any discrepancy, we will notify you.    Please call [phone] to pick up a complimentary CD of any radiology studies performed.  If you or your doctor would like to request a copy of your medical records, please call [phone].      ORTHOPEDIC INJURY   Please know that significant injuries can exist even when an initial x-ray is read as normal or negative.  This can occur because some fractures (broken bones) are not initially visible on x-rays or with soft tissue injuries.  For this reason, close outpatient follow-up with your primary care doctor or bone specialist (orthopedist) is required.    MEDICATIONS AND FOLLOWUP  Please be aware that some prescription medications can cause drowsiness.  Use caution when driving or operating machinery.    The examination and treatment you  have received in our Emergency Department is provided on an emergency basis, and is not intended to be a substitute for your primary care physician.  It is important that your doctor checks you again and that you report any new or remaining problems at that time.      [place], [place], [place] [address] (1.4 miles, 7 minutes)  [place], [place], [place] [address] (6.5 miles, 13 minutes)  Handout with directions available on  request.

## 2016-12-16 ENCOUNTER — Other Ambulatory Visit: Payer: Self-pay | Admitting: Neurology

## 2016-12-16 DIAGNOSIS — G959 Disease of spinal cord, unspecified: Secondary | ICD-10-CM

## 2016-12-17 ENCOUNTER — Ambulatory Visit: Payer: Medicare (Managed Care) | Attending: Neurology

## 2016-12-17 DIAGNOSIS — M4802 Spinal stenosis, cervical region: Secondary | ICD-10-CM | POA: Insufficient documentation

## 2016-12-17 DIAGNOSIS — M4726 Other spondylosis with radiculopathy, lumbar region: Secondary | ICD-10-CM | POA: Insufficient documentation

## 2016-12-17 DIAGNOSIS — M2578 Osteophyte, vertebrae: Secondary | ICD-10-CM | POA: Insufficient documentation

## 2016-12-17 DIAGNOSIS — G959 Disease of spinal cord, unspecified: Secondary | ICD-10-CM | POA: Insufficient documentation

## 2016-12-17 DIAGNOSIS — M4722 Other spondylosis with radiculopathy, cervical region: Secondary | ICD-10-CM | POA: Insufficient documentation

## 2016-12-20 ENCOUNTER — Ambulatory Visit (INDEPENDENT_AMBULATORY_CARE_PROVIDER_SITE_OTHER): Payer: Medicare (Managed Care) | Admitting: Specialist

## 2016-12-20 ENCOUNTER — Encounter (INDEPENDENT_AMBULATORY_CARE_PROVIDER_SITE_OTHER): Payer: Self-pay | Admitting: Specialist

## 2016-12-20 DIAGNOSIS — I872 Venous insufficiency (chronic) (peripheral): Secondary | ICD-10-CM

## 2016-12-20 HISTORY — DX: Venous insufficiency (chronic) (peripheral): I87.2

## 2016-12-20 NOTE — Progress Notes (Signed)
[facility]    Chief Complaint   Patient presents with   . Advice Only     consult to bilateral lower exmt swelling with pain          History of Present Illness     [name] is a 76 y.o. male who presents With history of pain, swelling, heaviness, tiredness involving both lower extremities.  This has been going on for a while.  Patient has been using knee-high compression stockings of 20-30 mmHg for many months with some improvement.  Patient denies having any history of deep vein thrombosis.    Past Medical History     Past Medical History:   Diagnosis Date   . Deep venous thrombosis of distal lower extremity 2016    last fall subnormal clot in leg   . Ear, nose and throat disorder 2016    rhinitis (occasionally severe)   . Fracture 2013    left fibula   . Hyperlipidemia    . 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 Peripheral vascular disease 2016   . Restless leg syndrome    . Sciatica    . Sleep apnea     documented w/ CPAP   . 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 Venous insufficiency of both lower extremities 12/20/2016       Past Surgical History     Past Surgical History:   Procedure Laterality Date   . ANKLE FRACTURE SURGERY  2013   . ARTHROSCOPY, KNEE Left 03/22/2016    Procedure: ARTHROSCOPY, KNEE;  Surgeon: [name], MD;  Location: [facility];  Service: Orthopedics;  Laterality: Left;  LEFT KNEE PARTIAL MEDIAL AND LATERAL MENISCECTOMY   . COLONOSCOPY     . COSMETIC SURGERY      eye lift   . CYSTOSCOPY, TURP N/A 04/23/2015    Procedure: CYSTOSCOPY, TURP, BLADDER BIOPSY;  Surgeon: Jobe Gibbon, MD;  Location: [facility] OR;  Service: Urology;  Laterality: N/A;  CYSTOSCOPY, CLOT EVACUATION,  TURP, BLADDER BIOPSY w/FULGERATION   . FRACTURE SURGERY  2013   . HERNIA REPAIR     . KNEE ARTHROSCOPY W/ MENISCAL REPAIR Left 2012   . PROSTATE SURGERY  04/2015   . SKIN BIOPSY         Family History     History reviewed. No pertinent family history.    Social History     Social  History     Social History   . Marital status: Married     Spouse name: N/A   . Number of children: N/A   . Years of education: N/A     Occupational History   . Not on file.     Social History Main Topics   . Smoking status: Former Smoker     Packs/day: 0.25     Years: 25.00     Quit date: 03/21/1978   . Smokeless tobacco: Never Used   . Alcohol use 4.2 oz/week     7 Glasses of wine per week   . Drug use: No   . Sexual activity: Not on file     Other Topics Concern   . Not on file     Social History Narrative   . No narrative on file       Allergies     No Known Allergies    Medications     Current Outpatient Prescriptions on File Prior  to Visit   Medication Sig Dispense Refill   . gabapentin (NEURONTIN) 300 MG capsule Take 300 mg by mouth 3 (three) times daily.     . rotigotine (NEUPRO) 6 MG/24HR patch      . atorvastatin (LIPITOR) 10 MG tablet Take 10 mg by mouth daily.     Marland Kitchen zolpidem (AMBIEN CR) 12.5 MG CR tablet Take by mouth.       No current facility-administered medications on file prior to visit.        Review of Systems     Constitutional: Negative for fevers and chills  Skin: No rash or lesions  Respiratory: Negative for cough, wheezing, or hemoptysis  Cardiovascular: as per HPI  Gastrointestinal: Negative for abdominal pain, nausea, vomiting and diarrhea  Musculoskeletal:  No arthritic symptoms  Genitourinary: Negative for dysuria  All other systems were reviewed and are negative except what is stated in the HPI      Physical Exam     Vitals:    12/20/16 1455   BP: 130/78   Pulse: 64       Body mass index is 29.03 kg/m.    General: Patient appears their stated age, well-nourished. Alert and in no apparent distress.  Lungs: Respiratory effort unlabored, chest expansion symmetric.  Cardiac: RRR, no carotid bruits, no JVD.   Extremities warm, Right Femoral pulses 2+, Left Femoral 2+, Right popliteal 2+, Left popliteal 2+, Right DP 2+, Left DP 2+, Right PT 2+, Left PT 2+,   Abd: Soft, nondistended,  nontender. No guarding or rebound, No mid line pulsatile mass   VHQ:IONG ROM in all 4 extremities, symmetric , Has swelling and varicosities both lower extremities  Skin: Color appropriate for race, Skin warm, dry, no gangrene, no non healing ulcers,, no hyperpigmentation, no lipo-dermatosclerosis  Neuro: Good insight and judgment, oriented to person, place, and time CN II-XII intact, gross motor and sensory intact    Labs     CBC:   WBC   Date/Time Value Ref Range Status   07/27/2015 02:16 PM 5.15 3.50 - 10.80 x10 3/uL Final     RBC   Date/Time Value Ref Range Status   07/27/2015 02:16 PM 4.61 (L) 4.70 - 6.00 x10 6/uL Final     Hgb   Date/Time Value Ref Range Status   07/27/2015 02:16 PM 14.6 13.0 - 17.0 g/dL Final     Hematocrit   Date/Time Value Ref Range Status   07/27/2015 02:16 PM 42.9 42.0 - 52.0 % Final     MCV   Date/Time Value Ref Range Status   07/27/2015 02:16 PM 93.1 80.0 - 100.0 fL Final     MCHC   Date/Time Value Ref Range Status   07/27/2015 02:16 PM 34.0 32.0 - 36.0 g/dL Final     RDW   Date/Time Value Ref Range Status   07/27/2015 02:16 PM 14 12 - 15 % Final     Platelets   Date/Time Value Ref Range Status   07/27/2015 02:16 PM 188 140 - 400 x10 3/uL Final       CMP:   Sodium   Date/Time Value Ref Range Status   07/27/2015 02:16 PM 139 136 - 145 mEq/L Final     Potassium   Date/Time Value Ref Range Status   07/27/2015 02:16 PM 4.5 3.5 - 5.1 mEq/L Final     Chloride   Date/Time Value Ref Range Status   07/27/2015 02:16 PM 108 100 - 111 mEq/L Final  CO2   Date/Time Value Ref Range Status   07/27/2015 02:16 PM 23 22 - 29 mEq/L Final     Glucose   Date/Time Value Ref Range Status   07/27/2015 02:16 PM 92 70 - 100 mg/dL Final     Comment:     ADA guidelines for diabetes mellitus:  Fasting:  Equal to or greater than 126 mg/dL  Random:   Equal to or greater than 200 mg/dL       BUN   Date/Time Value Ref Range Status   07/27/2015 02:16 PM 20 9 - 28 mg/dL Final     Anion Gap   Date/Time Value Ref Range  Status   07/27/2015 02:16 PM 8.0 5.0 - 15.0 Final       Lipid Panel No results found for: CHOL, TRIG, HDL    Coags: No results found for: PT, INR, PTT      Diagnostic Imaging     I have reviewed and interpreted the vascular diagnostic imaging with the patient    Assessment and Plan       1. Venous insufficiency of both lower extremities  US Venous Insufficency Duplx Dopp Low Extrem Comp Bilat       My impression is that he most likely has venous insufficiency of both lower extremities.  I have recommended to him to continue with the use of knee-high compression stockings and obtain a venous duplex ultrasound examination.  Depending on the findings of the venous duplex.  Make appropriate recommendation.  If there is evidence of venous insufficiency, then he may benefit from ablation therapy of the superficial venous system.  This was explained to the patient and his wife and they have agreed.  I will see them back in the office after the venous duplex has been completed.    Dr. [name] like to thank you for allowing me to participate in the care of this patient.    With regards    Romelle Starcher, MD, FACS, RPVI  [facility]  Chief, Section of Vascular Surgery  [place]  [facility]

## 2016-12-23 ENCOUNTER — Ambulatory Visit (INDEPENDENT_AMBULATORY_CARE_PROVIDER_SITE_OTHER): Payer: Medicare (Managed Care)

## 2016-12-23 DIAGNOSIS — I872 Venous insufficiency (chronic) (peripheral): Secondary | ICD-10-CM

## 2016-12-23 NOTE — Procedures (Signed)
Full report will follow into chart

## 2016-12-27 ENCOUNTER — Encounter (INDEPENDENT_AMBULATORY_CARE_PROVIDER_SITE_OTHER): Payer: Self-pay

## 2017-01-03 ENCOUNTER — Ambulatory Visit (INDEPENDENT_AMBULATORY_CARE_PROVIDER_SITE_OTHER): Payer: Medicare (Managed Care) | Admitting: Specialist

## 2017-01-03 ENCOUNTER — Encounter (INDEPENDENT_AMBULATORY_CARE_PROVIDER_SITE_OTHER): Payer: Self-pay | Admitting: Specialist

## 2017-01-03 VITALS — BP 125/75 | HR 71 | Ht 73.0 in | Wt 217.0 lb

## 2017-01-03 DIAGNOSIS — I872 Venous insufficiency (chronic) (peripheral): Secondary | ICD-10-CM

## 2017-01-03 NOTE — Progress Notes (Signed)
[facility]    Chief Complaint   Patient presents with   . Follow-up     to venous u/s         History of Present Illness     [name] is a 76 y.o. male who presents With history of pain, swelling, heaviness, tiredness involving both lower extremities.  This has been going on for a while.  Patient has been using knee-high compression stockings of 20-30 mmHg for many months with some improvement.  Patient denies having any history of deep vein thrombosis.Patient has undergone follow-up duplex ultrasound examination.    Past Medical History     Past Medical History:   Diagnosis Date   . Deep venous thrombosis of distal lower extremity 2016    last fall subnormal clot in leg   . Ear, nose and throat disorder 2016    rhinitis (occasionally severe)   . Fracture 2013    left fibula   . Hyperlipidemia    . 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 Peripheral vascular disease 2016   . Restless leg syndrome    . Sciatica    . Sleep apnea     documented w/ CPAP   . 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 Venous insufficiency of both lower extremities 12/20/2016       Past Surgical History     Past Surgical History:   Procedure Laterality Date   . ANKLE FRACTURE SURGERY  2013   . ARTHROSCOPY, KNEE Left 03/22/2016    Procedure: ARTHROSCOPY, KNEE;  Surgeon: [name], MD;  Location: [facility];  Service: Orthopedics;  Laterality: Left;  LEFT KNEE PARTIAL MEDIAL AND LATERAL MENISCECTOMY   . COLONOSCOPY     . COSMETIC SURGERY      eye lift   . CYSTOSCOPY, TURP N/A 04/23/2015    Procedure: CYSTOSCOPY, TURP, BLADDER BIOPSY;  Surgeon: Jobe Gibbon, MD;  Location: [facility] OR;  Service: Urology;  Laterality: N/A;  CYSTOSCOPY, CLOT EVACUATION,  TURP, BLADDER BIOPSY w/FULGERATION   . FRACTURE SURGERY  2013   . HERNIA REPAIR     . KNEE ARTHROSCOPY W/ MENISCAL REPAIR Left 2012   . PROSTATE SURGERY  04/2015   . SKIN BIOPSY         Family History     History reviewed. No pertinent family  history.    Social History     Social History     Social History   . Marital status: Married     Spouse name: N/A   . Number of children: N/A   . Years of education: N/A     Occupational History   . Not on file.     Social History Main Topics   . Smoking status: Former Smoker     Packs/day: 0.25     Years: 25.00     Quit date: 03/21/1978   . Smokeless tobacco: Never Used   . Alcohol use 4.2 oz/week     7 Glasses of wine per week   . Drug use: No   . Sexual activity: Not on file     Other Topics Concern   . Not on file     Social History Narrative   . No narrative on file       Allergies     No Known Allergies    Medications     Current Outpatient Prescriptions on File Prior to  Visit   Medication Sig Dispense Refill   . atorvastatin (LIPITOR) 10 MG tablet Take 10 mg by mouth daily.     Marland Kitchen gabapentin (NEURONTIN) 300 MG capsule Take 300 mg by mouth 3 (three) times daily.     . rosuvastatin (CRESTOR) 5 MG tablet TAKE 1 TABLET(S) EVERY DAY BY ORAL ROUTE.  1   . rotigotine (NEUPRO) 6 MG/24HR patch      . zolpidem (AMBIEN CR) 12.5 MG CR tablet Take by mouth.       No current facility-administered medications on file prior to visit.        Review of Systems     Constitutional: Negative for fevers and chills  Skin: No rash or lesions  Respiratory: Negative for cough, wheezing, or hemoptysis  Cardiovascular: as per HPI  Gastrointestinal: Negative for abdominal pain, nausea, vomiting and diarrhea  Musculoskeletal:  No arthritic symptoms  Genitourinary: Negative for dysuria  All other systems were reviewed and are negative except what is stated in the HPI      Physical Exam     Vitals:    01/03/17 1022   BP: 125/75   Pulse: 71       Body mass index is 28.63 kg/m.    General: Patient appears their stated age, well-nourished. Alert and in no apparent distress.  Lungs: Respiratory effort unlabored, chest expansion symmetric.  Cardiac: RRR, no carotid bruits, no JVD.   Extremities warm, Right Femoral pulses 2+, Left Femoral 2+,  Right popliteal 2+, Left popliteal 2+, Right DP 2+, Left DP 2+, Right PT 2+, Left PT 2+,   Abd: Soft, nondistended, nontender. No guarding or rebound, No mid line pulsatile mass   ZOX:WRUE ROM in all 4 extremities, symmetric , Has swelling and varicosities both lower extremities  Skin: Color appropriate for race, Skin warm, dry, no gangrene, no non healing ulcers,, no hyperpigmentation, no lipo-dermatosclerosis  Neuro: Good insight and judgment, oriented to person, place, and time CN II-XII intact, gross motor and sensory intact    Labs     CBC:   WBC   Date/Time Value Ref Range Status   07/27/2015 02:16 PM 5.15 3.50 - 10.80 x10 3/uL Final     RBC   Date/Time Value Ref Range Status   07/27/2015 02:16 PM 4.61 (L) 4.70 - 6.00 x10 6/uL Final     Hgb   Date/Time Value Ref Range Status   07/27/2015 02:16 PM 14.6 13.0 - 17.0 g/dL Final     Hematocrit   Date/Time Value Ref Range Status   07/27/2015 02:16 PM 42.9 42.0 - 52.0 % Final     MCV   Date/Time Value Ref Range Status   07/27/2015 02:16 PM 93.1 80.0 - 100.0 fL Final     MCHC   Date/Time Value Ref Range Status   07/27/2015 02:16 PM 34.0 32.0 - 36.0 g/dL Final     RDW   Date/Time Value Ref Range Status   07/27/2015 02:16 PM 14 12 - 15 % Final     Platelets   Date/Time Value Ref Range Status   07/27/2015 02:16 PM 188 140 - 400 x10 3/uL Final       CMP:   Sodium   Date/Time Value Ref Range Status   07/27/2015 02:16 PM 139 136 - 145 mEq/L Final     Potassium   Date/Time Value Ref Range Status   07/27/2015 02:16 PM 4.5 3.5 - 5.1 mEq/L Final     Chloride   Date/Time Value  Ref Range Status   07/27/2015 02:16 PM 108 100 - 111 mEq/L Final     CO2   Date/Time Value Ref Range Status   07/27/2015 02:16 PM 23 22 - 29 mEq/L Final     Glucose   Date/Time Value Ref Range Status   07/27/2015 02:16 PM 92 70 - 100 mg/dL Final     Comment:     ADA guidelines for diabetes mellitus:  Fasting:  Equal to or greater than 126 mg/dL  Random:   Equal to or greater than 200 mg/dL       BUN    Date/Time Value Ref Range Status   07/27/2015 02:16 PM 20 9 - 28 mg/dL Final     Anion Gap   Date/Time Value Ref Range Status   07/27/2015 02:16 PM 8.0 5.0 - 15.0 Final       Lipid Panel No results found for: CHOL, TRIG, HDL    Coags: No results found for: PT, INR, PTT      Diagnostic Imaging     I have reviewed and interpreted the vascular diagnostic imaging with the patient    Assessment and Plan       1. Venous insufficiency of both lower extremities         Patient has undergone follow-up duplex ultrasound examination which revealed evidence of severe reflux in the right long saphenous vein, as well as left short saphenous vein.  His symptoms could be some related to venous insufficiency or could be neurological in origin.  Therefore, if they decide ablation therapy of the saphenous veins can be performed.  They would like to think about the procedure and if they decide.  I will proceed with ablation therapy of the right long and short saphenous veins.  This was explained to him and his wife and they have agreed.  The risks and benefits of the procedures were explained to the patient and agreed. The risk of infection, bleeding, deep vein thrombosis, pulmonary embolism, nerve injury and other complications were explained in detail and agreed.     Patient understands that symptoms may not completely resolve with this procedure.      Dr. [name] like to thank you for allowing me to participate in the care of this patient.    With regards    Joshua Starcher, MD, FACS, RPVI  [facility]  Chief, Section of Vascular Surgery    [facility]

## 2017-01-05 ENCOUNTER — Encounter (INDEPENDENT_AMBULATORY_CARE_PROVIDER_SITE_OTHER): Payer: Self-pay | Admitting: Specialist

## 2017-01-05 ENCOUNTER — Other Ambulatory Visit (INDEPENDENT_AMBULATORY_CARE_PROVIDER_SITE_OTHER): Payer: Self-pay | Admitting: Family

## 2017-01-05 DIAGNOSIS — I872 Venous insufficiency (chronic) (peripheral): Secondary | ICD-10-CM

## 2017-01-05 NOTE — Progress Notes (Signed)
Mailed RF packet and compression stocking order- [facility] 01/05/17

## 2017-01-06 ENCOUNTER — Encounter (INDEPENDENT_AMBULATORY_CARE_PROVIDER_SITE_OTHER): Payer: Self-pay | Admitting: Specialist

## 2017-01-24 ENCOUNTER — Encounter (INDEPENDENT_AMBULATORY_CARE_PROVIDER_SITE_OTHER): Payer: Self-pay | Admitting: Specialist

## 2017-01-25 ENCOUNTER — Ambulatory Visit (INDEPENDENT_AMBULATORY_CARE_PROVIDER_SITE_OTHER): Payer: Medicare (Managed Care) | Admitting: Orthopaedic Surgery

## 2017-01-30 ENCOUNTER — Telehealth (INDEPENDENT_AMBULATORY_CARE_PROVIDER_SITE_OTHER): Payer: Self-pay

## 2017-01-30 NOTE — Telephone Encounter (Signed)
[name]  Pre-Op Instructions      Procedure Information       Number of Procedures: 1st  Vein : Right Greater Saphenous Vein  Diameter: 0.83 cm    Past Medical History     Past Medical History:   Diagnosis Date   . Deep venous thrombosis of distal lower extremity 2016    last fall subnormal clot in leg   . Ear, nose and throat disorder 2016    rhinitis (occasionally severe)   . Fracture 2013    left fibula   . Hyperlipidemia    . 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 Peripheral Choi disease 2016   . Restless leg syndrome    . Sciatica    . Sleep apnea     documented w/ CPAP   . 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 Venous insufficiency of both lower extremities 12/20/2016       Past Surgical History     Past Surgical History:   Procedure Laterality Date   . ANKLE FRACTURE SURGERY  2013   . ARTHROSCOPY, KNEE Left 03/22/2016    Procedure: ARTHROSCOPY, KNEE;  Surgeon: [name], MD;  Location: [facility];  Service: Orthopedics;  Laterality: Left;  LEFT KNEE PARTIAL MEDIAL AND LATERAL MENISCECTOMY   . COLONOSCOPY     . COSMETIC SURGERY      eye lift   . CYSTOSCOPY, TURP N/A 04/23/2015    Procedure: CYSTOSCOPY, TURP, BLADDER BIOPSY;  Surgeon: Jobe Gibbon, MD;  Location: [facility];  Service: Urology;  Laterality: N/A;  CYSTOSCOPY, CLOT EVACUATION,  TURP, BLADDER BIOPSY w/FULGERATION   . FRACTURE SURGERY  2013   . HERNIA REPAIR     . KNEE ARTHROSCOPY W/ MENISCAL REPAIR Left 2012   . PROSTATE SURGERY  04/2015   . SKIN BIOPSY         Allergies     No Known Allergies    Medications     Current Outpatient Prescriptions on File Prior to Visit   Medication Sig Dispense Refill   . atorvastatin (LIPITOR) 10 MG tablet Take 10 mg by mouth daily.     Marland Kitchen gabapentin (NEURONTIN) 300 MG capsule Take 300 mg by mouth 3 (three) times daily.     . rosuvastatin (CRESTOR) 5 MG tablet TAKE 1 TABLET(S) EVERY DAY BY ORAL ROUTE.  1   . rotigotine (NEUPRO) 6 MG/24HR patch      . zolpidem (AMBIEN CR) 12.5 MG CR tablet  Take by mouth.       No current facility-administered medications on file prior to visit.        Instuctions     Location of Procedure: [place]    Arrive 30 Prior to Procedure: Patient informed    Compression Stockings: Patient informed to bring day of procedure    Sedative Valium: No    NPO: N/A    Medications: Reviewed    Responsible Adult for Driving Patient After Procedure: N/A    Additional Notes/Comments: n/a

## 2017-02-02 ENCOUNTER — Ambulatory Visit (INDEPENDENT_AMBULATORY_CARE_PROVIDER_SITE_OTHER): Payer: Medicare (Managed Care) | Admitting: Specialist

## 2017-02-02 DIAGNOSIS — I83891 Varicose veins of right lower extremities with other complications: Secondary | ICD-10-CM

## 2017-02-02 DIAGNOSIS — I872 Venous insufficiency (chronic) (peripheral): Secondary | ICD-10-CM

## 2017-02-02 NOTE — Progress Notes (Signed)
[facility]  RF Procedure      PREOP:       Allergies: No Known Allergies    Medications and allergies reviewed:yes    Vitals:   Vitals:    02/02/17 0902   BP: 133/80   Pulse: 66       Does patient have compression stockings 30-40 mmHg: yes    Consent signed:yes    Witness:yes    Valium Dosage: n/a Time GIven after consent:n/a    Driver: n/a  PROCEDURE:     Procedure: Venous Insufficiency of the Right Greater Saphenous Vein with maximum diameter of 0.89 cm.    Surgeon: Dr.Hashemi     Nurse: n/a    Medical Assistant:Nakyah Erdmann and Hillary    Ultrasound tech:N/A    Position:Supine    Chlora-prep site:Right L/E    Procedure start time: 9:32    Tumescent Solution:400 ml     2% Lidocaine: 3 cc    Pulse ox:N/A    Treatment Length: 60 cm    Radiofrequency Usage: Time: 5:20 mins, RF Cycles: 16, Watts: 9         Notes: 7 cm Ablation RFA Cath    Patient cleaned up, bandages placed, and assited to post op room    Procedure End Time: 9:50                The contents of the pre-printed label includes:  Patient Name [name]           DOB: 07-Jan-1941  Sodium Chloride .09%   Lidocaine  1%  50ml 500mg   Sodium Bicarbonate 8.4% 15ml  For Subcutaneous Infusion Only by Surgeon (NOT IV)  Prepared by Dr.Hashemi  Checked by Gillie Manners date 02/02/2017  Expiration time 9:50  *medication expires one hour after mixing  POST PROCEDURE:           BP: 135/77 HR: 71 R: n/a Pulse ox: 95  Pain score 0              Notes:       Patient tolerated procedure well, no adverse effects,pulses present, no hematomas, bandages in place, compression stockings applied.      Post op teaching done per protocol, patient verbalized understanding, patient escorted to waiting area.          Time Discharge:10:00  Nurse/Tech: Sue Lush

## 2017-02-02 NOTE — Procedures (Signed)
[facility]  Radiofrequency Ablation Procedure        PREOPERATIVE DIAGNOSIS: Symptomatic Venous Insufficiency     POSTOPERATIVE DIAGNOSIS: Venous Insufficiency of the right greater saphenous system.    SURGEON: Fransico Setters, MD    HPI: The patient is a 76 y.o. year male who presented with a history of lower extremity symptomatic venous insufficiency, and progressively worsening varicose veins associated with pain, swelling and inflammation.     Conservative treatment, including at least 3 months of compression stocking did not improve their symptoms.     Real-time color flow duplex ultrasound examination, demonstrated significant incompetence of the right greater saphenous system.     The right greater saphenous vein is enlarged in its entire course with a maximum diameter of 0.8 cm.     Surgical pause performed. No concerns were voiced by team members.      Procedure: Endovascular Radiofrequency Ablation of the right greater Saphenous Vein    The patient was placed in the Supine position, and the area of treatment was prepped and draped in the usual sterile fashion. Local anesthesia (1% Lidocaine) was used. A skin incision was made overlying the identified and mapped right greater saphenous vein entry site. The vein was accessed using ultrasound guidance and the Seldinger Technique, a guide wire was introduced through the needle, which was then exchanged over the wire for a 62F sheath, which was secured in place. The guide wire was removed and the sheath was flushed. The RF catheter was placed into the vein through the sheath and preferentially, imaging was used to place the catheter tip just inferior to the superficial epigastric vein to preserve normal physiological flow in the vein. Additionally, it was confirmed by ultrasound guidance that the catheter tip was also placed a minimum of 2.0 cm distal to the sapheno femoral junction. After the RF catheter position was verified by ultrasound, tumescent  anesthesia was infiltrated, under ultrasound guidance, precisely into the perivenous compartment along the entire length of vein from the entry site to the sapheno femoral junction until a "halo" of fluid was noted around the vein. After the RF catheter position was again confirmed with ultrasound imaging, and under external compression along the length of the heating element, RF energy was applied. The vein was segmentally ablated by heating a 6 cm segment and then indexing the catheter forward by 6 cm until the treatment length is completed. Device temperature was maintained at 1205 C Celsius with an initial power level of 40W dropping to below 20W for each treatment. Total vein length 60 cm and total of  16 RF Cycles.      The catheter and sheath were withdrawn and hemostasis established with direct pressure. After assuring hemostasis, the entry site was closed with skin adhesive and  a compression wrap, and/or graduated compression stocking was applied from the level of the foot to the most proximal level of the thigh.       Electronically Signed: Fransico Setters, MD

## 2017-02-03 ENCOUNTER — Ambulatory Visit (INDEPENDENT_AMBULATORY_CARE_PROVIDER_SITE_OTHER): Payer: Medicare (Managed Care)

## 2017-02-03 DIAGNOSIS — I872 Venous insufficiency (chronic) (peripheral): Secondary | ICD-10-CM

## 2017-02-03 NOTE — Procedures (Signed)
[name] is a 76 y.o. male patient.  Active Problems:    * No active hospital problems. *    Past Medical History:   Diagnosis Date   . Deep venous thrombosis of distal lower extremity 2016    last fall subnormal clot in leg   . Ear, nose and throat disorder 2016    rhinitis (occasionally severe)   . Fracture 2013    left fibula   . Hyperlipidemia    . 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 Peripheral vascular disease 2016   . Restless leg syndrome    . Sciatica    . Sleep apnea     documented w/ CPAP   . 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 Venous insufficiency of both lower extremities 12/20/2016     There were no vitals taken for this visit.    Procedures    Ellwood Handler  02/03/2017

## 2017-02-03 NOTE — Procedures (Signed)
Full report to be scanned into EPIC

## 2017-02-03 NOTE — Procedures (Signed)
Casimer Bernard Foor is a 75 y.o. male patient.  Active Problems:    * No active hospital problems. *    Past Medical History:   Diagnosis Date   . Deep venous thrombosis of distal lower extremity 2016    last fall subnormal clot in leg   . Ear, nose and throat disorder 2016    rhinitis (occasionally severe)   . Fracture 2013    left fibula   . Hyperlipidemia    . 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 Peripheral vascular disease 2016   . Restless leg syndrome    . Sciatica    . Sleep apnea     documented w/ CPAP   . 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 Venous insufficiency of both lower extremities 12/20/2016     There were no vitals taken for this visit.    Procedures    Jemaine Prokop J Glenyce Randle  02/03/2017

## 2017-02-07 ENCOUNTER — Telehealth (INDEPENDENT_AMBULATORY_CARE_PROVIDER_SITE_OTHER): Payer: Self-pay

## 2017-02-07 NOTE — Telephone Encounter (Signed)
[name]  Pre-Op Instructions      Procedure Information       Number of Procedures: 2nd  Vein : Left Short Saphenous Vein  Diameter: 0.6 cm    Past Medical History     Past Medical History:   Diagnosis Date   . Deep venous thrombosis of distal lower extremity 2016    last fall subnormal clot in leg   . Ear, nose and throat disorder 2016    rhinitis (occasionally severe)   . Fracture 2013    left fibula   . Hyperlipidemia    . 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 Peripheral vascular disease 2016   . Restless leg syndrome    . Sciatica    . Sleep apnea     documented w/ CPAP   . 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 Venous insufficiency of both lower extremities 12/20/2016       Past Surgical History     Past Surgical History:   Procedure Laterality Date   . ANKLE FRACTURE SURGERY  2013   . ARTHROSCOPY, KNEE Left 03/22/2016    Procedure: ARTHROSCOPY, KNEE;  Surgeon: [name], MD;  Location: [facility];  Service: Orthopedics;  Laterality: Left;  LEFT KNEE PARTIAL MEDIAL AND LATERAL MENISCECTOMY   . COLONOSCOPY     . COSMETIC SURGERY      eye lift   . CYSTOSCOPY, TURP N/A 04/23/2015    Procedure: CYSTOSCOPY, TURP, BLADDER BIOPSY;  Surgeon: Jobe Gibbon, MD;  Location: [facility];  Service: Urology;  Laterality: N/A;  CYSTOSCOPY, CLOT EVACUATION,  TURP, BLADDER BIOPSY w/FULGERATION   . FRACTURE SURGERY  2013   . HERNIA REPAIR     . KNEE ARTHROSCOPY W/ MENISCAL REPAIR Left 2012   . PROSTATE SURGERY  04/2015   . SKIN BIOPSY         Allergies     No Known Allergies    Medications     Current Outpatient Prescriptions on File Prior to Visit   Medication Sig Dispense Refill   . atorvastatin (LIPITOR) 10 MG tablet Take 10 mg by mouth daily.     Marland Kitchen gabapentin (NEURONTIN) 300 MG capsule Take 300 mg by mouth 3 (three) times daily.     . rosuvastatin (CRESTOR) 5 MG tablet TAKE 1 TABLET(S) EVERY DAY BY ORAL ROUTE.  1   . rotigotine (NEUPRO) 6 MG/24HR patch      . zolpidem (AMBIEN CR) 12.5 MG CR tablet Take  by mouth.       No current facility-administered medications on file prior to visit.        Instuctions     Location of Procedure: [place]    Arrive 30 Prior to Procedure: Patient informed    Compression Stockings: Patient informed to bring day of procedure    Sedative Valium: n/a    NPO: n/a    Medications: Reviewed    Responsible Adult for Driving Patient After Procedure: n/a    Additional Notes/Comments: n/a

## 2017-02-09 ENCOUNTER — Encounter (INDEPENDENT_AMBULATORY_CARE_PROVIDER_SITE_OTHER): Payer: Self-pay

## 2017-02-09 ENCOUNTER — Ambulatory Visit (INDEPENDENT_AMBULATORY_CARE_PROVIDER_SITE_OTHER): Payer: Medicare (Managed Care) | Admitting: Specialist

## 2017-02-09 DIAGNOSIS — I872 Venous insufficiency (chronic) (peripheral): Secondary | ICD-10-CM

## 2017-02-09 DIAGNOSIS — I83892 Varicose veins of left lower extremities with other complications: Secondary | ICD-10-CM

## 2017-02-09 NOTE — Progress Notes (Signed)
[facility]  RF Procedure      PREOP:       Allergies: No Known Allergies    Medications and allergies reviewed:yes    Vitals:   Vitals:    02/09/17 1159   BP: 129/77   Pulse: 71       Does patient have compression stockings 30-40 mmHg: yes    Consent signed:yes    Witness:yes    Valium Dosage: n/a Time GIven after consent:n/a    Driver: n/a  PROCEDURE:     Procedure: Venous Insufficiency of the Left Short Saphenous Vein with maximum diameter of 0.6 cm.    Surgeon: Dr.Hashemi     Nurse: n/a    Medical Assistant: Sue Lush and Hillary     Ultrasound tech:N/A    Position:Prone    Chlora-prep site:Left L/E    Procedure start time: 12:25    Tumescent Solution:200 ml     1% Lidocaine: 2 cc    Pulse ox:N/A    Treatment Length: 29 cm    Radiofrequency Usage: Time: 2:40 mins, RF Cycles: 8, Watts: 10         Notes: 7 cm Ablation RFA Cath    Patient cleaned up, bandages placed, and assited to post op room    Procedure End Time: 12:37                The contents of the pre-printed label includes:  Patient Name [name]           DOB: 20-Jun-1941  Sodium Chloride .09%   Lidocaine  1%  50ml 500mg   Sodium Bicarbonate 8.4% 15ml  For Subcutaneous Infusion Only by Surgeon (NOT IV)  Prepared by Dr.Hashemi  Checked by Gillie Manners date 02/09/2017  Expiration time 13:20  *medication expires one hour after mixing  POST PROCEDURE:           BP: 119/76 HR: 69 R: n/a Pulse ox: 97  Pain score 0              Notes:       Patient tolerated procedure well, no adverse effects,pulses present, no hematomas, bandages in place, compression stockings applied.      Post op teaching done per protocol, patient verbalized understanding, patient escorted to waiting area.          Time Discharge: 13:00  Nurse/Tech: Sue Lush

## 2017-02-09 NOTE — Procedures (Signed)
[facility]  Radiofrequency Ablation Procedure        PREOPERATIVE DIAGNOSIS: Symptomatic Venous Insufficiency     POSTOPERATIVE DIAGNOSIS: Venous Insufficiency of the left short saphenous vein saphenous system.    SURGEON: Fransico Setters, MD    HPI: The patient is a 76 y.o. year male who presented with a history of lower extremity symptomatic venous insufficiency, and progressively worsening varicose veins associated with pain, swelling and inflammation.     Conservative treatment, including at least 3 months of compression stocking did not improve their symptoms.     Real-time color flow duplex ultrasound examination, demonstrated significant incompetence of the left short saphenous system.     The left short saphenous vein is enlarged in its entire course with a maximum diameter of 0.5 cm.     Surgical pause performed. No concerns were voiced by team members.      Procedure: Endovascular Radiofrequency Ablation of the left short Saphenous Vein    The patient was placed in the Prone position, and the area of treatment was prepped and draped in the usual sterile fashion. Local anesthesia (1% Lidocaine) was used. A skin incision was made overlying the identified and mapped left short saphenous vein entry site. The vein was accessed using ultrasound guidance and the Seldinger Technique, a guide wire was introduced through the needle, which was then exchanged over the wire for a 61F sheath, which was secured in place. The guide wire was removed and the sheath was flushed. The RF catheter was placed into the vein through the sheath and preferentially, imaging was used to place the catheter tip just inferior to the superficial epigastric vein to preserve normal physiological flow in the vein. Additionally, it was confirmed by ultrasound guidance that the catheter tip was also placed a minimum of 2.0 cm distal to the sapheno popliteal junction. After the RF catheter position was verified by ultrasound, tumescent  anesthesia was infiltrated, under ultrasound guidance, precisely into the perivenous compartment along the entire length of vein from the entry site to the sapheno popliteal junction until a "halo" of fluid was noted around the vein. After the RF catheter position was again confirmed with ultrasound imaging, and under external compression along the length of the heating element, RF energy was applied. The vein was segmentally ablated by heating a 6 cm segment and then indexing the catheter forward by 6 cm until the treatment length is completed. Device temperature was maintained at 1205 C Celsius with an initial power level of 40W dropping to below 20W for each treatment. Total vein length 29 cm and total of  8 RF Cycles.      The catheter and sheath were withdrawn and hemostasis established with direct pressure. After assuring hemostasis, the entry site was closed with skin adhesive and  a compression wrap, and/or graduated compression stocking was applied from the level of the foot to the most proximal level of the thigh.       Electronically Signed: Fransico Setters, MD

## 2017-02-10 ENCOUNTER — Ambulatory Visit (INDEPENDENT_AMBULATORY_CARE_PROVIDER_SITE_OTHER): Payer: Medicare (Managed Care)

## 2017-02-10 DIAGNOSIS — I872 Venous insufficiency (chronic) (peripheral): Secondary | ICD-10-CM

## 2017-02-10 NOTE — Procedures (Signed)
Full report will follow into chart

## 2017-02-24 ENCOUNTER — Encounter (INDEPENDENT_AMBULATORY_CARE_PROVIDER_SITE_OTHER): Payer: Self-pay | Admitting: Specialist

## 2017-03-07 ENCOUNTER — Ambulatory Visit: Payer: Medicare (Managed Care)

## 2017-03-07 NOTE — Pre-Procedure Instructions (Signed)
Low risk procedure -no pre-ops as per guidelines.  Emailed [email or web address] pre-op and hibiclens instructions.  No orders to fax or enter at this time.  Consent and H&P in epic-need updated dos

## 2017-03-15 ENCOUNTER — Other Ambulatory Visit (INDEPENDENT_AMBULATORY_CARE_PROVIDER_SITE_OTHER): Payer: Self-pay

## 2017-03-16 ENCOUNTER — Encounter (INDEPENDENT_AMBULATORY_CARE_PROVIDER_SITE_OTHER): Payer: Self-pay | Admitting: Specialist

## 2017-03-17 ENCOUNTER — Encounter: Admission: RE | Payer: Self-pay | Source: Ambulatory Visit

## 2017-03-17 ENCOUNTER — Ambulatory Visit: Admission: RE | Admit: 2017-03-17 | Payer: Medicare (Managed Care) | Source: Ambulatory Visit | Admitting: Specialist

## 2017-03-17 HISTORY — DX: Unspecified visual disturbance: H53.9

## 2017-03-17 LAB — CBC AND DIFFERENTIAL
Baso(Absolute): 10 cells/uL (ref 0–200)
Basophils: 0.2 %
Eosinophils Absolute: 130 cells/uL (ref 15–500)
Eosinophils: 2.5 %
Hematocrit: 45.7 % (ref 38.5–50.0)
Hemoglobin: 15.2 g/dL (ref 13.2–17.1)
Lymphocytes Absolute: 1279 cells/uL (ref 850–3900)
Lymphocytes: 24.6 %
MCH: 31 pg (ref 27.0–33.0)
MCHC: 33.3 g/dL (ref 32.0–36.0)
MCV: 93.3 fL (ref 80.0–100.0)
MPV: 9.6 fL (ref 7.5–12.5)
Monocytes Absolute: 660 cells/uL (ref 200–950)
Monocytes: 12.7 %
Neutrophils Absolute: 3120 cells/uL (ref 1500–7800)
Neutrophils: 60 %
Platelets: 210 10*3/uL (ref 140–400)
RBC: 4.9 10*6/uL (ref 4.20–5.80)
RDW: 12.6 % (ref 11.0–15.0)
WBC: 5.2 10*3/uL (ref 3.8–10.8)

## 2017-03-17 LAB — BASIC METABOLIC PANEL
BUN: 19 mg/dL (ref 7–25)
CO2: 27 mmol/L (ref 20–31)
Calcium: 9.5 mg/dL (ref 8.6–10.3)
Chloride: 105 mmol/L (ref 98–110)
Creatinine: 0.77 mg/dL (ref 0.70–1.18)
EGFR African American: 102 mL/min/{1.73_m2} (ref >=60)
Glucose: 97 mg/dL (ref 65–99)
NON-AFRICAN AMERICA EGFR: 88 mL/min/{1.73_m2} (ref >=60)
Potassium: 6 mmol/L — ABNORMAL HIGH (ref 3.5–5.3)
Sodium: 140 mmol/L (ref 135–146)

## 2017-03-17 LAB — PT AND APTT
PT INR: 0.9
PT: 9.5 s (ref 9.0–11.5)
aPTT: 28 s (ref 22–34)

## 2017-03-17 LAB — MRSA CULTURE

## 2017-03-17 SURGERY — PHLEBECTOMY
Anesthesia: Choice | Laterality: Bilateral

## 2017-03-21 ENCOUNTER — Ambulatory Visit: Payer: Medicare (Managed Care) | Attending: Orthopaedic Surgery

## 2017-03-21 NOTE — Pre-Procedure Instructions (Signed)
No Cardiac or Respiratory events within past 6months.  Orders faxed to pharmacy  Faxed surgeon for Pre-ops when available, PCP appointment was done last week.  Scheduled for JIM class with his wife on 6/25.

## 2017-03-27 ENCOUNTER — Other Ambulatory Visit (INDEPENDENT_AMBULATORY_CARE_PROVIDER_SITE_OTHER): Payer: Self-pay

## 2017-03-28 DIAGNOSIS — G2581 Restless legs syndrome: Secondary | ICD-10-CM | POA: Insufficient documentation

## 2017-03-28 LAB — POTASSIUM: Potassium: 4.3 mmol/L (ref 3.5–5.3)

## 2017-04-10 NOTE — Anesthesia Preprocedure Evaluation (Addendum)
Anesthesia Evaluation    AIRWAY    Mallampati: II    TM distance: >3 FB  Neck ROM: full  Mouth Opening:full   CARDIOVASCULAR    cardiovascular exam normal, regular and normal       DENTAL    no notable dental hx     PULMONARY    pulmonary exam normal and clear to auscultation     OTHER FINDINGS    EKG SR LAFB   NL HCT PLT CR   Recheck K DOS - last was 6    Hx neuropathy and restless leg (also affects arms per pt and family)  Has tolerated narcotics well in the past  Plan GA LMA, no block        Relevant Problems   No relevant active problems       PSS Anesthesia Comments: Okay to proceed         Anesthesia Plan    ASA 3     general               (Risks discussed including but not limited to:  neurological complications such as stroke,   cardiovascular complications such as heart attack,   pulmonary complications such as asthmatic attack,   intra-operative awareness, death, dental trauma, and allergic reaction.     Questions answered.     Pt understands and wishes to proceed.    Martie Lee, MD)      intravenous induction   Detailed anesthesia plan: general LMA        Post op pain management: per surgeon    informed consent obtained    Plan discussed with CRNA.    ECG reviewed  pertinent labs reviewed             Signed by: Ursula Alert 04/10/17 9:43 AM

## 2017-04-17 ENCOUNTER — Ambulatory Visit: Payer: Medicare (Managed Care)

## 2017-04-17 ENCOUNTER — Encounter
Admission: RE | Disposition: A | Discharge status: Home / Self Care | Payer: Self-pay | Source: Ambulatory Visit | Attending: Orthopaedic Surgery

## 2017-04-17 ENCOUNTER — Ambulatory Visit: Payer: Self-pay

## 2017-04-17 ENCOUNTER — Inpatient Hospital Stay: Payer: Self-pay

## 2017-04-17 ENCOUNTER — Inpatient Hospital Stay
Admission: RE | Admit: 2017-04-17 | Discharge: 2017-04-18 | DRG: 470 | Disposition: A | Discharge status: Home / Self Care | Payer: Medicare (Managed Care) | Source: Ambulatory Visit | Attending: Orthopaedic Surgery | Admitting: Orthopaedic Surgery

## 2017-04-17 ENCOUNTER — Ambulatory Visit: Payer: Medicare (Managed Care) | Admitting: Anesthesiology

## 2017-04-17 DIAGNOSIS — M1712 Unilateral primary osteoarthritis, left knee: Secondary | ICD-10-CM | POA: Diagnosis present

## 2017-04-17 DIAGNOSIS — I739 Peripheral vascular disease, unspecified: Secondary | ICD-10-CM | POA: Diagnosis present

## 2017-04-17 DIAGNOSIS — Z79899 Other long term (current) drug therapy: Secondary | ICD-10-CM

## 2017-04-17 DIAGNOSIS — E785 Hyperlipidemia, unspecified: Secondary | ICD-10-CM | POA: Diagnosis present

## 2017-04-17 DIAGNOSIS — G2581 Restless legs syndrome: Secondary | ICD-10-CM | POA: Diagnosis present

## 2017-04-17 DIAGNOSIS — G629 Polyneuropathy, unspecified: Secondary | ICD-10-CM | POA: Diagnosis present

## 2017-04-17 DIAGNOSIS — G4733 Obstructive sleep apnea (adult) (pediatric): Secondary | ICD-10-CM | POA: Diagnosis present

## 2017-04-17 DIAGNOSIS — Z87891 Personal history of nicotine dependence: Secondary | ICD-10-CM

## 2017-04-17 HISTORY — DX: Disorder of prostate, unspecified: N42.9

## 2017-04-17 HISTORY — DX: Unspecified osteoarthritis, unspecified site: M19.90

## 2017-04-17 HISTORY — PX: ARTHROPLASTY, KNEE, TOTAL, COMPUTER NAVIGATION: SHX3136

## 2017-04-17 LAB — TYPE AND SCREEN
AB Screen Gel: NEGATIVE
ABO Rh: O POS

## 2017-04-17 SURGERY — ARTHROPLASTY, KNEE, TOTAL, COMPUTER NAVIGATION
Anesthesia: Anesthesia General | Site: Knee | Laterality: Left | Wound class: Clean

## 2017-04-17 MED ORDER — VITAMINS/MINERALS PO TABS
1.0000 | ORAL_TABLET | Freq: Every morning | ORAL | Status: DC
Start: 2017-04-17 — End: 2017-04-18
  Administered 2017-04-18: 08:00:00 1 via ORAL
  Filled 2017-04-17 (×2): qty 1

## 2017-04-17 MED ORDER — OXYCODONE HCL ER 10 MG PO T12A
EXTENDED_RELEASE_TABLET | ORAL | Status: AC
Start: 2017-04-17 — End: ?
  Filled 2017-04-17: qty 1

## 2017-04-17 MED ORDER — FERROUS SULFATE 324 (65 FE) MG PO TBEC
324.0000 mg | DELAYED_RELEASE_TABLET | Freq: Every morning | ORAL | Status: DC
Start: 2017-04-17 — End: 2017-04-18
  Administered 2017-04-18: 08:00:00 324 mg via ORAL
  Filled 2017-04-17 (×2): qty 1

## 2017-04-17 MED ORDER — GABAPENTIN 300 MG PO CAPS
ORAL_CAPSULE | ORAL | Status: AC
Start: 2017-04-17 — End: ?
  Filled 2017-04-17: qty 1

## 2017-04-17 MED ORDER — OXYCODONE HCL 5 MG PO TABS
10.0000 mg | ORAL_TABLET | ORAL | Status: DC | PRN
Start: 2017-04-17 — End: 2017-04-18
  Administered 2017-04-17: 10 mg via ORAL
  Filled 2017-04-17 (×2): qty 2

## 2017-04-17 MED ORDER — HYDROMORPHONE HCL 0.5 MG/0.5 ML IJ SOLN
0.5000 mg | INTRAMUSCULAR | Status: DC | PRN
Start: 2017-04-17 — End: 2017-04-18

## 2017-04-17 MED ORDER — VITAMIN D 1000 UNITS PO TABS
1000.0000 [IU] | ORAL_TABLET | Freq: Every day | ORAL | Status: DC
Start: 2017-04-18 — End: 2017-04-18
  Administered 2017-04-18: 08:00:00 1000 [IU] via ORAL
  Filled 2017-04-17: qty 1

## 2017-04-17 MED ORDER — CELECOXIB 100 MG PO CAPS
200.0000 mg | ORAL_CAPSULE | Freq: Two times a day (BID) | ORAL | Status: DC
Start: 2017-04-17 — End: 2017-04-18
  Administered 2017-04-17 – 2017-04-18 (×2): 200 mg via ORAL
  Filled 2017-04-17 (×2): qty 2

## 2017-04-17 MED ORDER — ASPIRIN 325 MG PO TBEC
325.0000 mg | DELAYED_RELEASE_TABLET | Freq: Two times a day (BID) | ORAL | Status: DC
Start: 2017-04-18 — End: 2017-04-18
  Administered 2017-04-18: 08:00:00 325 mg via ORAL
  Filled 2017-04-17: qty 1

## 2017-04-17 MED ORDER — MIDAZOLAM HCL 2 MG/2ML IJ SOLN
INTRAMUSCULAR | Status: AC
Start: 2017-04-17 — End: ?
  Filled 2017-04-17: qty 2

## 2017-04-17 MED ORDER — CELECOXIB 100 MG PO CAPS
200.0000 mg | ORAL_CAPSULE | Freq: Once | ORAL | Status: AC
Start: 2017-04-17 — End: 2017-04-17
  Administered 2017-04-17: 09:00:00 200 mg via ORAL

## 2017-04-17 MED ORDER — PROPOFOL 10 MG/ML IV EMUL (WRAP)
INTRAVENOUS | Status: AC
Start: 2017-04-17 — End: ?
  Filled 2017-04-17: qty 60

## 2017-04-17 MED ORDER — PROMETHAZINE HCL 25 MG RE SUPP
25.0000 mg | Freq: Four times a day (QID) | RECTAL | Status: DC | PRN
Start: 2017-04-17 — End: 2017-04-18

## 2017-04-17 MED ORDER — PROMETHAZINE HCL 25 MG PO TABS
25.0000 mg | ORAL_TABLET | Freq: Four times a day (QID) | ORAL | Status: DC | PRN
Start: 2017-04-17 — End: 2017-04-18

## 2017-04-17 MED ORDER — PHENYLEPHRINE 100 MCG/ML IN NACL 0.9% IV SOSY
PREFILLED_SYRINGE | INTRAVENOUS | Status: DC | PRN
Start: 2017-04-17 — End: 2017-04-17
  Administered 2017-04-17 (×3): 100 ug via INTRAVENOUS

## 2017-04-17 MED ORDER — ROPIVACAINE HCL 5 MG/ML IJ SOLN
INTRAMUSCULAR | Status: AC
Start: 2017-04-17 — End: ?
  Filled 2017-04-17: qty 20

## 2017-04-17 MED ORDER — POLYMYXIN B SULFATE 500000 UNITS IJ SOLR
INTRAMUSCULAR | Status: DC | PRN
Start: 2017-04-17 — End: 2017-04-17
  Administered 2017-04-17: 500000 [IU]

## 2017-04-17 MED ORDER — KETOROLAC TROMETHAMINE 30 MG/ML IJ SOLN
INTRAMUSCULAR | Status: AC
Start: 2017-04-17 — End: ?
  Filled 2017-04-17: qty 1

## 2017-04-17 MED ORDER — DEXAMETHASONE SODIUM PHOSPHATE 4 MG/ML IJ SOLN (WRAP)
INTRAMUSCULAR | Status: DC | PRN
Start: 2017-04-17 — End: 2017-04-17
  Administered 2017-04-17: 10 mg via INTRAVENOUS

## 2017-04-17 MED ORDER — BACITRACIN 50000 UNITS IM SOLR
INTRAMUSCULAR | Status: AC
Start: 2017-04-17 — End: ?
  Filled 2017-04-17: qty 50000

## 2017-04-17 MED ORDER — PROMETHAZINE HCL 25 MG/ML IJ SOLN
6.2500 mg | Freq: Four times a day (QID) | INTRAMUSCULAR | Status: DC | PRN
Start: 2017-04-17 — End: 2017-04-18

## 2017-04-17 MED ORDER — EPHEDRINE SULFATE 50 MG/ML IJ/IV SOLN (WRAP)
Status: DC | PRN
Start: 2017-04-17 — End: 2017-04-17
  Administered 2017-04-17: 5 mg via INTRAVENOUS
  Administered 2017-04-17 (×2): 10 mg via INTRAVENOUS

## 2017-04-17 MED ORDER — DEXMEDETOMIDINE HCL 200 MCG/2ML IV SOLN
INTRAVENOUS | Status: DC | PRN
Start: 2017-04-17 — End: 2017-04-17
  Administered 2017-04-17: 11:00:00 .3 ug/kg/h via INTRAVENOUS

## 2017-04-17 MED ORDER — ONDANSETRON HCL 4 MG/2ML IJ SOLN
INTRAMUSCULAR | Status: AC
Start: 2017-04-17 — End: ?
  Filled 2017-04-17: qty 2

## 2017-04-17 MED ORDER — CELECOXIB 100 MG PO CAPS
ORAL_CAPSULE | ORAL | Status: AC
Start: 2017-04-17 — End: ?
  Filled 2017-04-17: qty 2

## 2017-04-17 MED ORDER — HYDROMORPHONE HCL 1 MG/ML IJ SOLN
INTRAMUSCULAR | Status: AC
Start: 2017-04-17 — End: ?
  Filled 2017-04-17: qty 1

## 2017-04-17 MED ORDER — KETOROLAC TROMETHAMINE 30 MG/ML IJ SOLN
INTRAMUSCULAR | Status: DC | PRN
Start: 2017-04-17 — End: 2017-04-17
  Administered 2017-04-17: 15 mg

## 2017-04-17 MED ORDER — TRANEXAMIC ACID-NACL 1000-0.9 MG/100ML-% IV SOLN (SIMPLE - CNR)
1000.0000 mg | Freq: Once | INTRAVENOUS | Status: AC
Start: 2017-04-17 — End: 2017-04-17
  Administered 2017-04-17: 10:00:00 1000 mg via INTRAVENOUS

## 2017-04-17 MED ORDER — ONDANSETRON HCL 4 MG/2ML IJ SOLN
4.0000 mg | Freq: Three times a day (TID) | INTRAMUSCULAR | Status: DC | PRN
Start: 2017-04-17 — End: 2017-04-18

## 2017-04-17 MED ORDER — TRANEXAMIC ACID-NACL 1000-0.9 MG/100ML-% IV SOLN (SIMPLE - CNR)
1000.0000 mg | Freq: Once | INTRAVENOUS | Status: AC
Start: 2017-04-17 — End: 2017-04-17
  Administered 2017-04-17: 11:00:00 1000 mg via INTRAVENOUS

## 2017-04-17 MED ORDER — PROPOFOL 10 MG/ML IV EMUL (WRAP)
INTRAVENOUS | Status: AC
Start: 2017-04-17 — End: ?
  Filled 2017-04-17: qty 20

## 2017-04-17 MED ORDER — HYDROMORPHONE HCL 0.5 MG/0.5 ML IJ SOLN
0.4000 mg | INTRAMUSCULAR | Status: DC | PRN
Start: 2017-04-17 — End: 2017-04-17

## 2017-04-17 MED ORDER — TRANEXAMIC ACID-NACL 1000-0.9 MG/100ML-% IV SOLN (SIMPLE - CNR)
INTRAVENOUS | Status: AC
Start: 2017-04-17 — End: ?
  Filled 2017-04-17: qty 200

## 2017-04-17 MED ORDER — TRAMADOL HCL 50 MG PO TABS
50.0000 mg | ORAL_TABLET | Freq: Four times a day (QID) | ORAL | Status: DC
Start: 2017-04-17 — End: 2017-04-18
  Administered 2017-04-17 – 2017-04-18 (×3): 50 mg via ORAL
  Filled 2017-04-17 (×3): qty 1

## 2017-04-17 MED ORDER — FENTANYL CITRATE (PF) 50 MCG/ML IJ SOLN (WRAP)
INTRAMUSCULAR | Status: AC
Start: 2017-04-17 — End: ?
  Filled 2017-04-17: qty 2

## 2017-04-17 MED ORDER — BACITRACIN 50000 UNITS IM SOLR
INTRAMUSCULAR | Status: DC | PRN
Start: 2017-04-17 — End: 2017-04-17
  Administered 2017-04-17: 50000 [IU]

## 2017-04-17 MED ORDER — OXYCODONE HCL 5 MG PO TABS
5.0000 mg | ORAL_TABLET | ORAL | Status: DC | PRN
Start: 2017-04-17 — End: 2017-04-18
  Administered 2017-04-17 – 2017-04-18 (×3): 5 mg via ORAL
  Filled 2017-04-17 (×2): qty 1

## 2017-04-17 MED ORDER — MIDAZOLAM HCL 2 MG/2ML IJ SOLN
2.0000 mg | Freq: Once | INTRAMUSCULAR | Status: DC
Start: 2017-04-17 — End: 2017-04-17

## 2017-04-17 MED ORDER — OXYCODONE HCL ER 10 MG PO T12A
10.0000 mg | EXTENDED_RELEASE_TABLET | Freq: Once | ORAL | Status: AC
Start: 2017-04-17 — End: 2017-04-17
  Administered 2017-04-17: 09:00:00 10 mg via ORAL

## 2017-04-17 MED ORDER — PROPOFOL INFUSION 10 MG/ML
INTRAVENOUS | Status: DC | PRN
Start: 2017-04-17 — End: 2017-04-17
  Administered 2017-04-17: 30 mg via INTRAVENOUS
  Administered 2017-04-17: 170 mg via INTRAVENOUS
  Administered 2017-04-17: 30 mg via INTRAVENOUS
  Administered 2017-04-17: 50 mg via INTRAVENOUS

## 2017-04-17 MED ORDER — LIDOCAINE HCL 2 % IJ SOLN
INTRAMUSCULAR | Status: DC | PRN
Start: 2017-04-17 — End: 2017-04-17
  Administered 2017-04-17: 60 mg via INTRAVENOUS

## 2017-04-17 MED ORDER — POLYMYXIN B SULFATE 500000 UNITS IJ SOLR
INTRAMUSCULAR | Status: AC
Start: 2017-04-17 — End: ?
  Filled 2017-04-17: qty 500000

## 2017-04-17 MED ORDER — DEXMEDETOMIDINE HCL 200 MCG/2ML IV SOLN
INTRAVENOUS | Status: AC
Start: 2017-04-17 — End: ?
  Filled 2017-04-17: qty 2

## 2017-04-17 MED ORDER — NALOXONE HCL 0.4 MG/ML IJ SOLN (WRAP)
0.4000 mg | INTRAMUSCULAR | Status: DC | PRN
Start: 2017-04-17 — End: 2017-04-18

## 2017-04-17 MED ORDER — SODIUM CHLORIDE 0.9 % IV SOLN
INTRAVENOUS | Status: DC
Start: 2017-04-17 — End: 2017-04-18

## 2017-04-17 MED ORDER — ACETAMINOPHEN 500 MG PO TABS
1000.0000 mg | ORAL_TABLET | Freq: Three times a day (TID) | ORAL | Status: DC
Start: 2017-04-17 — End: 2017-04-18
  Administered 2017-04-17 – 2017-04-18 (×3): 1000 mg via ORAL
  Filled 2017-04-17 (×3): qty 2

## 2017-04-17 MED ORDER — LACTATED RINGERS IV SOLN
INTRAVENOUS | Status: DC
Start: 2017-04-17 — End: 2017-04-17

## 2017-04-17 MED ORDER — DEXTROSE 5 % IV SOLN
2.0000 g | Freq: Three times a day (TID) | INTRAVENOUS | Status: AC
Start: 2017-04-17 — End: 2017-04-18
  Administered 2017-04-17 – 2017-04-18 (×2): 2 g via INTRAVENOUS
  Filled 2017-04-17 (×2): qty 2000

## 2017-04-17 MED ORDER — ACETAMINOPHEN 500 MG PO TABS
ORAL_TABLET | ORAL | Status: AC
Start: 2017-04-17 — End: ?
  Filled 2017-04-17: qty 2

## 2017-04-17 MED ORDER — ONDANSETRON HCL 4 MG/2ML IJ SOLN
INTRAMUSCULAR | Status: DC | PRN
Start: 2017-04-17 — End: 2017-04-17
  Administered 2017-04-17: 4 mg via INTRAVENOUS

## 2017-04-17 MED ORDER — DEXTROSE 5 % IV SOLN
INTRAVENOUS | Status: AC
Start: 2017-04-17 — End: ?
  Filled 2017-04-17: qty 2000

## 2017-04-17 MED ORDER — MEPERIDINE HCL 25 MG/ML IJ SOLN
25.0000 mg | Freq: Once | INTRAMUSCULAR | Status: DC
Start: 2017-04-17 — End: 2017-04-17

## 2017-04-17 MED ORDER — ACETAMINOPHEN 500 MG PO TABS
1000.0000 mg | ORAL_TABLET | Freq: Once | ORAL | Status: AC
Start: 2017-04-17 — End: 2017-04-17
  Administered 2017-04-17: 09:00:00 1000 mg via ORAL

## 2017-04-17 MED ORDER — SENNOSIDES-DOCUSATE SODIUM 8.6-50 MG PO TABS
2.0000 | ORAL_TABLET | Freq: Two times a day (BID) | ORAL | Status: DC
Start: 2017-04-17 — End: 2017-04-18
  Administered 2017-04-17 – 2017-04-18 (×2): 2 via ORAL
  Filled 2017-04-17 (×2): qty 2

## 2017-04-17 MED ORDER — GABAPENTIN 300 MG PO CAPS
300.0000 mg | ORAL_CAPSULE | Freq: Once | ORAL | Status: AC
Start: 2017-04-17 — End: 2017-04-17
  Administered 2017-04-17: 09:00:00 300 mg via ORAL

## 2017-04-17 MED ORDER — SODIUM CHLORIDE 0.9 % IR SOLN
Status: DC | PRN
Start: 2017-04-17 — End: 2017-04-17
  Administered 2017-04-17: 1000 mL

## 2017-04-17 MED ORDER — EPINEPHRINE HCL 1 MG/ML IJ SOLN (WRAP)
Status: DC | PRN
Start: 2017-04-17 — End: 2017-04-17
  Administered 2017-04-17: .5 mg

## 2017-04-17 MED ORDER — ROPIVACAINE HCL 5 MG/ML IJ SOLN
INTRAMUSCULAR | Status: DC | PRN
Start: 2017-04-17 — End: 2017-04-17
  Administered 2017-04-17: 20 mL

## 2017-04-17 MED ORDER — HYDROMORPHONE HCL 1 MG/ML IJ SOLN
INTRAMUSCULAR | Status: DC | PRN
Start: 2017-04-17 — End: 2017-04-17
  Administered 2017-04-17: .25 mg via INTRAVENOUS
  Administered 2017-04-17: 0.5 mg via INTRAVENOUS
  Administered 2017-04-17: .2 mg via INTRAVENOUS
  Administered 2017-04-17: .25 mg via INTRAVENOUS
  Administered 2017-04-17: .3 mg via INTRAVENOUS
  Administered 2017-04-17: 0.5 mg via INTRAVENOUS

## 2017-04-17 MED ORDER — PATIENT SUPPLIED NON FORMULARY
1.0000 | Freq: Every day | Status: DC
Start: 2017-04-18 — End: 2017-04-18

## 2017-04-17 MED ORDER — GABAPENTIN 300 MG PO CAPS
1200.0000 mg | ORAL_CAPSULE | Freq: Every evening | ORAL | Status: DC
Start: 2017-04-17 — End: 2017-04-18
  Administered 2017-04-17: 22:00:00 1200 mg via ORAL
  Filled 2017-04-17: qty 4

## 2017-04-17 MED ORDER — FENTANYL CITRATE (PF) 50 MCG/ML IJ SOLN (WRAP)
25.0000 ug | INTRAMUSCULAR | Status: DC | PRN
Start: 2017-04-17 — End: 2017-04-17

## 2017-04-17 MED ORDER — VITAMIN C 500 MG PO TABS
500.0000 mg | ORAL_TABLET | Freq: Every morning | ORAL | Status: DC
Start: 2017-04-17 — End: 2017-04-18
  Administered 2017-04-18: 08:00:00 500 mg via ORAL
  Filled 2017-04-17 (×2): qty 1

## 2017-04-17 MED ORDER — ONDANSETRON HCL 4 MG/2ML IJ SOLN
4.0000 mg | Freq: Once | INTRAMUSCULAR | Status: DC | PRN
Start: 2017-04-17 — End: 2017-04-17

## 2017-04-17 MED ORDER — GABAPENTIN 300 MG PO CAPS
300.0000 mg | ORAL_CAPSULE | Freq: Every day | ORAL | Status: DC
Start: 2017-04-18 — End: 2017-04-18
  Administered 2017-04-18: 08:00:00 300 mg via ORAL
  Filled 2017-04-17: qty 1

## 2017-04-17 MED ORDER — SODIUM CHLORIDE 0.9 % IV SOLN
INTRAVENOUS | Status: DC
Start: 2017-04-17 — End: 2017-04-17

## 2017-04-17 MED ORDER — SODIUM CHLORIDE BACTERIOSTATIC 0.9 % IJ SOLN
INTRAMUSCULAR | Status: DC | PRN
Start: 2017-04-17 — End: 2017-04-17
  Administered 2017-04-17: 20 mL

## 2017-04-17 MED ORDER — FENTANYL CITRATE (PF) 50 MCG/ML IJ SOLN (WRAP)
INTRAMUSCULAR | Status: DC | PRN
Start: 2017-04-17 — End: 2017-04-17
  Administered 2017-04-17 (×2): 25 ug via INTRAVENOUS
  Administered 2017-04-17: 50 ug via INTRAVENOUS

## 2017-04-17 MED ORDER — PROMETHAZINE HCL 25 MG/ML IJ SOLN
6.2500 mg | Freq: Once | INTRAMUSCULAR | Status: DC | PRN
Start: 2017-04-17 — End: 2017-04-17

## 2017-04-17 MED ORDER — DEXTROSE 5 % IV SOLN
2.0000 g | INTRAVENOUS | Status: AC
Start: 2017-04-17 — End: 2017-04-17
  Administered 2017-04-17: 10:00:00 2 g via INTRAVENOUS

## 2017-04-17 MED ORDER — ROSUVASTATIN CALCIUM 5 MG PO TABS
5.0000 mg | ORAL_TABLET | Freq: Every evening | ORAL | Status: DC
Start: 2017-04-17 — End: 2017-04-18
  Administered 2017-04-17: 22:00:00 5 mg via ORAL
  Filled 2017-04-17: qty 1

## 2017-04-17 MED ORDER — EPHEDRINE SULFATE 50 MG/ML IJ/IV SOLN (WRAP)
Status: AC
Start: 2017-04-17 — End: ?
  Filled 2017-04-17: qty 1

## 2017-04-17 MED ORDER — DEXAMETHASONE SODIUM PHOSPHATE 4 MG/ML IJ SOLN
INTRAMUSCULAR | Status: AC
Start: 2017-04-17 — End: ?
  Filled 2017-04-17: qty 3

## 2017-04-17 MED ORDER — HYDROXYZINE PAMOATE 25 MG PO CAPS
25.0000 mg | ORAL_CAPSULE | Freq: Two times a day (BID) | ORAL | Status: DC | PRN
Start: 2017-04-17 — End: 2017-04-18

## 2017-04-17 MED ORDER — LACTATED RINGERS IR SOLN
Status: AC | PRN
Start: 2017-04-17 — End: 2017-04-17
  Administered 2017-04-17: 3000 mL

## 2017-04-17 MED ORDER — ONDANSETRON 4 MG PO TBDP
4.0000 mg | ORAL_TABLET | Freq: Three times a day (TID) | ORAL | Status: DC | PRN
Start: 2017-04-17 — End: 2017-04-18

## 2017-04-17 SURGICAL SUPPLY — 82 items
ADHESIVE SKIN CLOSURE DERMABOND ADVANCED (Skin Closure) ×1
ADHESIVE SKIN CLOSURE DERMABOND ADVANCED .7 ML LIQUID APPLICATOR (Skin Closure) ×1 IMPLANT
ADHESIVE SKIN DERMABOND ADV (Skin Closure) ×1
BANDAGE ACE NONSTERILE 6IN LF (Bandage) ×1
BANDAGE ELASTIC L5 YD X W6 IN W/SELF-CLOSURE HOOK (Bandage) ×1 IMPLANT
BANDAGE MEDLINE MEDIUM COMPRESSION L5 YD (Bandage) ×1
BASEPLATE TIBIAL 8 KNEE CEMENT PRIMARY (Base) ×1 IMPLANT
BASEPLATE TIBIAL 8 KNEE CEMENT PRIMARY TRIATHLON (Base) ×1 IMPLANT
BASEPLT TRIATH PRIM TIB SZ8 (Base) ×1 IMPLANT
BATTERY NAVIGATION STRYKER (Procedure Accessories) ×6 IMPLANT
BIPOLAR SEALER MALLEABLE (Cautery) ×1
BLADE 13.0X12.5MM (Blade) ×1
BLADE SAW THK1.27 MM THK1.45 MM SAGITTAL (Blade) ×1
BLADE SAW THK1.27 MM THK1.45 MM SAGITTAL L70 MM X W12.5 MM LARGE BONE (Blade) ×1 IMPLANT
BRACE KNEE TRI-PANEL 19IN (Immobilizer)
CATHETER YANKAUER BULBOUS TIP (Suction) ×2 IMPLANT
CEMENT BONE HIGH VISCOSITY COBALT 40 GM (Cement) ×2 IMPLANT
CEMENT COBALT HV HI CNTRST 40G (Cement) ×2 IMPLANT
COMP TRIATH FMRL CR SZ8 LT (Component) ×2 IMPLANT
COMPONENT PATELLAR H11 MM OD38 MM (Patella) ×1 IMPLANT
COMPONENT PATELLAR H11 MM OD38 MM ASYMMETRIC TRIATHLON X3 KNEE (Patella) ×1 IMPLANT
DRAPE 3/4 SHEET FANFLD 52X76IN (Drape) ×6 IMPLANT
DRAPE PLASTIC U 60X72 (Drape) ×1
DRAPE SPAC STATN SURG 30X72IN (Drape) ×1
DRAPE SRG TBRN CNVRT 72X60IN LF STRL U (Drape) ×1
DRAPE SURGICAL U STRIP IMPERVIOUS ADHESIVE SPLIT L72 IN X W60 IN (Drape) ×1 IMPLANT
DRAPE TABLE 1 PIECE COVER BARRIER (Drape) ×1
DRAPE TABLE 1 PIECE COVER BARRIER PLASTIC PEDIGO PRODUCTS, INC. CLEAR (Drape) ×1 IMPLANT
DRESSING AQUACEL AG 3.5X10IN (Dressing) ×1
DRESSING AQUACEL AG L25 CM X W9 CM (Dressing) ×1
DRESSING AQUACEL AG L25 CM X W9 CM HYDROCOLLOID ANTIMICROBIAL (Dressing) ×1 IMPLANT
GLOVE BIOGEL SURGCL INDICTR 8 (Glove) ×1
GLOVE SURG BIOGEL SZ7.5 (Glove) ×6 IMPLANT
GLOVE SURGICAL 8 INDICATOR BIOGEL POWDER (Glove) ×1
GLOVE SURGICAL 8 INDICATOR BIOGEL POWDER FREE SMOOTH BEAD CUFF (Glove) ×1 IMPLANT
HANDLE LIGHT ADAPTIVE LIGHT CONTROL PLUS (Other) ×1
HANDLE LIGHT ADAPTIVE LIGHT CONTROL PLUS TECHNOLOGY SNAP ON LENS TOUCH (Other) ×1 IMPLANT
HANDLE TRUMPF LIGHT  DISP (Other) ×1
HANDPIECE INTERPLUSE HIGH FLOW (Cautery) ×2 IMPLANT
HOOD T4 STERI-SHIELD (Personal Protection) ×6 IMPLANT
IMMOBILIZER KNEE UNIVERSAL L19 IN CANVAS (Immobilizer)
IMMOBILIZER KNEE UNIVERSAL L19 IN CANVAS ORTHOPEDIC DEROYAL OD12-24 IN (Immobilizer) IMPLANT
INSERT TIBIAL 8 TRIATHLON H11 MM KNEE (Insert) ×1 IMPLANT
INSERT TIBIAL 8 TRIATHLON H11 MM KNEE BEARING CRUCIATE RETAIN X3 (Insert) ×1 IMPLANT
INSERT TRIATH TIB CR SZ8 11MM (Insert) ×1 IMPLANT
KIT INFECTION CONTROL CUSTOM (Kits) ×2
KIT INFECTION CONTROL CUSTOM IFOH03 (Kits) ×1 IMPLANT
KIT OPTIVAC MIX DOUBLE 80G (Ortho Supply) ×1
MRKR SKN W RULER AND LABELS (Positioning Supplies) ×1
NEEDLE SPINAL DISP 18GX3.5IN (Needles) ×2 IMPLANT
PAD COLD THRPY (Other) ×1
PAD KNEE SHOULDER COLD SELF SEAL THERAPEUTIC COOLTEMP L11 IN X W10 IN (Other) ×1 IMPLANT
PAD THRP UNV CLTMP 11X10IN LTX CLD SLFSL (Other) ×1
PATELLA TRIATH ASYM 38X11MM (Patella) ×1 IMPLANT
PEN SURGICAL MARKING MEDLINE SKIN RULER (Positioning Supplies) ×1
PEN SURGICAL MARKING SKIN RULER LARGE RESERVOIR REGULAR TIP LABEL (Positioning Supplies) ×1 IMPLANT
PIN FIXATION OD3.18 MM PRELOAD L90 MM (Procedure Accessories) ×2
PIN FIXATION OD3.18 MM PRELOAD L90 MM PINABALL (Procedure Accessories) ×2 IMPLANT
PIN PRLDD 3.2X90MM S EA=1 (Procedure Accessories) ×2
RINGER LAC IRRIG ARHTRO (Irrigation Solutions) ×1
SLN ALC ISOPROPYL 70 4OZ (Scrub Supplies) ×1
SOLUTION ANTISEPTIC RUB BOTTLE MEDLINE (Scrub Supplies) ×1 IMPLANT
SOLUTION IRRIGATION LACTATED RINGERS (Irrigation Solutions) ×1
SOLUTION IRRIGATION LACTATED RINGERS 3000 ML PLASTIC CONTAINER (Irrigation Solutions) ×1 IMPLANT
SPONGE CHLRPRP TINT 26ML (Applicator) ×2 IMPLANT
SPONGE GAUZE L4 IN X W4 IN 12 PLY (Sponge) ×1 IMPLANT
SPONGE GAUZE STR 10'S 12PLY4X4 (Sponge) ×1
SUTURE COATED VICRYL 1 CT-1 L27 IN BRAID (Suture) ×3
SUTURE COATED VICRYL 1 CT-1 L27 IN BRAID COATED UNDYED ABSORBABLE (Suture) ×3 IMPLANT
SUTURE COATED VICRYL 2-0 CT-1 L27 IN (Suture) ×2
SUTURE COATED VICRYL 2-0 CT-1 L27 IN BRAID COATED UNDYED ABSORBABLE (Suture) ×2 IMPLANT
SUTURE MONOCRYL 4-0 PS2 27IN (Suture) ×2 IMPLANT
SUTURE VICRYL 1 CT1 27IN (Suture) ×3
SUTURE VICRYL 2.0 CT1 27IN (Suture) ×2
SYRINGE 50 ML GRADUATE NONPYROGENIC DEHP (Syringes, Needles) ×1
SYRINGE 50 ML GRADUATE NONPYROGENIC DEHP FREE PVC FREE LOK MEDICAL (Syringes, Needles) ×1 IMPLANT
SYRINGE LUER LOK 50ML (Syringes, Needles) ×1
SYSTEM BONE CEMENT LONG CARTRIDGE NOZZLE (Ortho Supply) ×1
SYSTEM BONE CEMENT LONG CARTRIDGE NOZZLE 80 GM OPTIVAC (Ortho Supply) ×1 IMPLANT
TRAY TOTAL KNEE FFX (Pack) ×2 IMPLANT
UNIT ELECTROSURGICAL L5.74 MM 30 D (Cautery) ×1
UNIT ELECTROSURGICAL L5.74 MM 30 D BIPOLAR SEALER LIGHT HEMOSTATIC (Cautery) ×1 IMPLANT

## 2017-04-17 NOTE — Progress Notes (Signed)
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  Feels good.  Pain control.  No complaints.    Physical Exam:     Vitals:    04/17/17 1515   BP: 114/55   Pulse: 72   Resp: 16   Temp: 97.5 F (36.4 C)   SpO2: 92%       Intake and Output Summary (Last 24 hours) at Date Time    Intake/Output Summary (Last 24 hours) at 04/17/17 1720  Last data filed at 04/17/17 1632   Gross per 24 hour   Intake             2570 ml   Output                0 ml   Net             2570 ml       Left knee  - Aquacel Ag in place.  No erythema or drainage.  - Calf soft and non-tender.  - Palpable dorsalis pedis and posterior tibial pulse  - Sensation to light touch intact throughout the right foot  - Motor: Intact toe flexion/extension and ankle dorsiflexion/plantarflexion      Labs:     Results     Procedure Component Value Units Date/Time    Type and Screen [[id number]] Collected:  04/17/17 0855    Specimen:  Blood Updated:  04/17/17 0955     ABO Rh O POS     AB Screen Gel NEG              Signed by: Cathey Endow

## 2017-04-17 NOTE — Plan of Care (Signed)
Problem: Knee Surgery  Goal: Pain at adequate level as identified by patient  Outcome: Progressing   04/17/17 1929   Goal/Interventions addressed this shift   Pain at adequate level as identified by patient  Identify patient comfort function goal;Evaluate if patient comfort function goal is met;Administer analgesics as prescribed to achieve pain goal     Pt reports pain is manageable with current regimen and no pain when not moving  Goal: Mobility/activity is maintained at optimum level for patient  Outcome: Progressing   04/17/17 1929   Goal/Interventions addressed this shift   Mobility/activity is maintained at optimum level for patient Administer analgesics as prescribed to achieve pain goal;PT and/or OT evaluation and treatment if ordered;Review weight bearing status with patient/patient care companion;Out of bed to chair if indicated with assistance as needed     Worked with PT and walked in hallway with walker, brace not needed

## 2017-04-17 NOTE — PT Eval Note (Signed)
[facility]  Physical Therapy Evaluation    Patient: [name] MRN: [id number]   Unit: 5NEW ORTHOPEDICS    Bed: V409/W119-14    Medicare ID # - Medicare Sub. NumHurshel Choi       Recommendation  Discharge Recommendation: Home with outpatient PT  DME Recommended for Discharge:  (none)  PT - Next Visit Recommendation: 04/18/17  PT Frequency: 4-5x/wk    Recommended mode of transportation at discharge: car  PMP - Progressive Mobility Protocol   PMP Activity: Step 7 - Walks out of Room (80)      Plan:       Patient Goal:  (home c wife)     Risks/Benefits/POC Discussed with Pt/Family: With patient/family       Treatment/Interventions: Gait training;Exercise;Functional transfer training;Bed mobility         Goals  Goal Formulation: With patient/family  Time for Goal Acheivement: 3 visits  Goals: Select goal  Pt Will Transfer Bed/Chair: with rolling walker;independent  Pt Will Ambulate: 151-200 feet;with standard walker;independent  Pt Will Go Up / Down Stairs: 3-5 stairs;With crutches;independent  Pt Will Perform Home Exer Program: independent           Interdisciplinary Communication:   Pt left up in chair with alarm activated.  Updated white communication board in room with patient's current mobility status . Communicated with  RN re tx    Education:   Educated patient to role of physical therapy, plan of care, transfer training techniques, falls prevention, gait training techniques, home safety, next appropriate level of care, exercises for lower extremity including TKR prtoo.      Patient  Demonstrated good understanding.      Discussed goals of therapy with patient in agreement with plan.      Evaluation:   Consult received for [name] for PT evaluation and treatment.  Chart reviewed.  Patient's medical condition is appropriate for Physical Therapy intervention at this time.     Medical Diagnosis: Osteoarthritis of left knee, unspecified osteoarthritis type [M17.12]  Localized  osteoarthritis of left knee [M17.12]    Therapy Diagnosis: difficulty walking    Precautions  Weight Bearing Status: no restrictions  Total Knee Replacement:  (ind SLR immob deferred)    History of Present Illness: [name] is a 76 y.o. male admitted on 04/17/2017 with  L TKR    Patient Active Problem List   Diagnosis   . Dizziness and giddiness   . Gross hematuria   . Tear of medial meniscus of left knee   . Venous insufficiency of both lower extremities   . Localized osteoarthritis of left knee     Past Medical History:   Diagnosis Date   . Abnormal vision     glasses   . Arthritis    . Disorder of prostate    . Ear, nose and throat disorder 2016    rhinitis (occasionally severe)   . Fracture 2013    left fibula   . Hyperlipidemia    . Hyperlipidemia    . 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 Neuropathy     neuralgia in  both legs   . Peripheral vascular disease 2016   . Peripheral vascular disease 2016, 2017, 2018   . Restless leg syndrome    . Sciatica    . Sleep apnea     documented w/ CPAP   . 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  Venous insufficiency of both lower extremities 12/20/2016     Past Surgical History:   Procedure Laterality Date   . ANKLE FRACTURE SURGERY  2013   . ARTHROSCOPY, KNEE Left 03/22/2016    Procedure: ARTHROSCOPY, KNEE;  Surgeon: [name], MD;  Location: [facility];  Service: Orthopedics;  Laterality: Left;  LEFT KNEE PARTIAL MEDIAL AND LATERAL MENISCECTOMY   . COLONOSCOPY     . COSMETIC SURGERY      eye lift   . CYSTOSCOPY, TURP N/A 04/23/2015    Procedure: CYSTOSCOPY, TURP, BLADDER BIOPSY;  Surgeon: Jobe Gibbon, MD;  Location: [facility];  Service: Urology;  Laterality: N/A;  CYSTOSCOPY, CLOT EVACUATION,  TURP, BLADDER BIOPSY w/FULGERATION   . FRACTURE SURGERY  2013   . HERNIA REPAIR     . KNEE ARTHROSCOPY W/ MENISCAL REPAIR Left 2012   . PROSTATE SURGERY  04/2015   . SKIN BIOPSY         Prior Level of Function  Prior level of function: Ambulates  independently;Independent with ADLs  Baseline Activity Level: Community ambulation  Driving: independent  DME Currently at Home: Standard walker;Single point cane;Crutches      Home Living Arrangements  Living Arrangements: Spouse/significant other;Children  Type of Home: House  Home Layout: One level (3 STE)  DME Currently at Home: Standard walker;Single point cane;Crutches        Subjective: Patient is agreeable to participation in the therapy session.   Pain Assessment  Pain Assessment: Numeric Scale (0-10)  Pain Score: 1-mild pain  Pain Intervention(s): Cold applied          Objective:  Patient is in bed with dressings and mod LE pitting edema-had PTA.    Observation of patient/vitals   Inspection/Posture  Inspection/Posture:  (large stature)  Cognition/Neuro Status  Arousal/Alertness:  (lethargic)  Attention Span: Appears intact  Orientation Level: Oriented X4        Musculoskeletal Examination  Gross ROM  Right Upper Extremity ROM: within functional limits  Left Upper Extremity ROM: within functional limits  Right Lower Extremity ROM: within functional limits  Left Lower Extremity ROM: within functional limits (knee flex ~5-120 today)        Gross Strength  Right Lower Extremity Strength: within functional limits  Left Lower Extremity Strength: within functional limits (ind SLR)                Sensation: intact      Functional Mobility  Supine to Sit:  (min A for LLE out of bed)  Sit to Stand: Supervision  Stand to Sit: Supervision         Locomotion  Ambulation: Supervision;with front-wheeled walker (80 feet)  Pattern:  (good knee stability s brace)       Participation and Endurance  Participation Effort: good  Endurance:  (no hypotension c OOB)         Assessment:   Examination - Assessment: Decreased LE ROM;Decreased LE strength;Decreased endurance/activity tolerance;Decreased functional mobility;Gait impairment, resulting in difficulty with ADLs.   Standardized tests and exams incorporated into evaluation  include ROM  and Strength.    Co-morbidities/Patient factors affecting plan of care -  There are a few comorbidities or other factors that affect plan of care and require modification of task including: has stairs to manage.    Clinical factors affecting plan of care -  Pt demonstrates a stable clinical presentation         Time Calculation  PT Received On:  04/17/17  Start Time: 1530  Stop Time: 1600  Time Calculation (min): 30 min  Tiburcio Pea, PT

## 2017-04-17 NOTE — Progress Notes (Signed)
Peripheral nerve block not performed due to peripheral neuropathy and intermittent unstable gait.

## 2017-04-17 NOTE — Op Note (Signed)
FULL OPERATIVE NOTE    Date Time: 04/17/17 5:22 PM  Patient Name: [name]  Attending Physician: Cathey Endow, MD      Date of Operation:   04/17/2017    Providers Performing:   Surgeon(s):  Cathey Endow, MD  Fitzmorris, [place], [place]    Circulator: Imagene Riches, RN  Relief Scrub: Adline Peals, RN  Scrub Person: Allegra Grana    Operative Procedure:   Left total knee arthroplasty    Preoperative Diagnosis:   Left knee osteoarthritis    Postoperative Diagnosis:   Left knee osteoarthritis    Anesthesia   General    Estimated Blood Loss:   MInimal    Implants:     Implant Name Type Inv. Item Serial No. Manufacturer Lot No. LRB No. Used Action   CEMENT COBALT HV HI CNTRST 40G - [id number] Cement CEMENT COBALT HV HI CNTRST 40G  DJO [employer] [id number] Left 1 Implanted   CEMENT COBALT HV HI CNTRST 40G - [id number] Cement CEMENT COBALT HV HI CNTRST 40G  DJO SURGICAL [id number] Left 1 Implanted   PATELLA TRIATHLON X3 ASYMTRC - [id number] Patella PATELLA TRIATHLON X3 ASYMTRC  STRYK[employer] [id number] Left 1 Implanted   BASEPLATE PRIMARY TIBIAL SZ8 - [id number] Base BASEPLATE PRIMARY TIBIAL SZ8  STRYK[employer] OR[employer] [id number] Left 1 Implanted   COMP TRIATH FMRL CEMENT CR 8 - [id number] Component COMP TRIATH FMRL CEMENT CR 8  STRYKER OR[employer] [id number] Left 1 Implanted   TRIATHLON X3 TIBIAL BEARING INSERT - CR SZE 8       [employer] [id number] Left 1 Implanted       Drains:   None    Specimens:   Bone and soft tissue    Complications:   None      Indications:   The patient is a 76 year old male with longstanding history of pain in the left knee secondary to osteoarthritis.  After prolonged conservative management, the patient was offered a total knee arthroplasty.  The risks and benefits of the procedure were described.  Risks include but are not  limited to, bleeding, infection, neurovascular injury, persistent pain,  stiffness, loosening, wear, fracture, instability, DVT, pulmonary embolism, loss of limb,  stroke, heart attack, death, and the risks of anesthesia.  The patient indicated their  understanding and agreed to proceed.    Operative Notes:   The patient was brought to the preoperative holding area and identified and  identified as JoshuaOmarius Choi.  The left knee was marked as the appropriate operative site.  Preoperative antibiotics were administered prior to  surgical incision.  The patient was then brought to the operating room and placed on the operating room table.  General anesthesia was administered by the anesthesiologist with no complications. A tourniquet was applied to the thigh.  The left lower extremity was prepped and draped in the usual standard fashion.  An operative timeout was performed and the operative team unanimously confirmed the patient, procedure and operative site.  At  this time, an Esmarch was used to exsanguinate the extremity and the  tourniquet was inflated to 250 mm Hg.  A midline incision and medial  parapatellar arthrotomy was performed.  The medial capsular sleeve was elevated  to the mid coronal plane.  The patella was subluxed laterally.  The ACL was intact. The PCL was intact.  Inspection of the knee demonstrated advanced varus osteoarthritis.  The ACL was excised. At this time, the Stryker navigation array was pinned to the distal femur. We  then registerred the hip center and distal femur. The distal femoral cutting block was then attached and adjusted to take a 9 mm resection from the high side in 3 degrees of flexion and neutral mechanical axis. The array was then removed. The distal femoral resection was than made and the cutting block was removed. With the knee flexed,  the femoral sizing guide was applied to the femur and the femur sized to a size 8.  The size 8, 4-in-1 femoral cutting block was pinned parallel to the transepicondylar axis. The finishing cuts were then made.  The Stryker navigation array was then  pinned to the tibial articular surface and  the proximal tibia and medial and lateral malleolus were registered. The tibial resection block was placed and adjusted to make a resection perpendicular to the long axis of the tibia in the frontal plane with 3 degrees of posterior slope, taking 9 mm off the high side. The array was then removed and the resection was made. The cutting block was then removed.  With the knee in extension, the extension gap was checked with a spacer block and overall alignment was checked with rods.  The medial and lateral meniscus were excised and osteophytes were removed from the posterior aspect of the femoral condyles.  Flexion and extension gaps were checked to ensure they were well balanced.  The tibia was sized to a size 8. A size 8 tibial base plate was pinned and aligned to the tibial tubercle.  The trial size 8 cruciate retaining femoral component was then placed.  Trial inserts were then placed.  With a 11 mm thick trial insert, there was excellent varus and valgus  stability.  The knee was taken through a range of motion.  There was excellent patellar tracking.  Attention was turned to the patella.   Osteophytes were removed from the patella.  The patella was cut with an  oscillating saw and sized to size 38-mm asymmetric component.  A 38-mm template was placed. Three peg holes were drilled.  A trial 38-mm asymmetric component was then placed.  The knee was reduced and taken through a range of motion.  There  was excellent patellar tracking.  At this time, the trial patellar component, femoral component and insert were removed.  Tibial rotation was set with a keel punch.  Trial tibial component was removed.  The knee was copiously irrigated with pulse lavage irrigation.  Bone surfaces were dried.  The posterior capsule was injected with 20 mL 0.5% ropivicaine, 0.5 mg epinephrine, and 15 mg of Toradol mixed with 20 mL injectable saline. The final components were cemented into place using bone cement (Stryker Triathalon  total knee arthroplasty size 8 CR left femoral component, size 8 tibial component, and a size 38 mm asymmetric patellar component.  Excess cement was removed from the periphery of the components.  A trial insert was placed while the cement cured. After the cement had cured, I again took the knee through a range of motion with a 11-mm thick insert, and there was excellent varus-valgus stability,  range of motion and patellar tracking.  At this time, the trial insert was  removed.  The tourniquet was deflated and hemostasis was obtained. Copious irrigation was used and the knee was irrigated and suctioned. The final 11-mm thick insert was then placed.  The knee was reduced.  The arthrotomy was closed with #1 Vicryl.  Subcutaneous tissue was closed with 2-0 Vicryl and the skin was closed with 4-0 monocryl  subcuticular suture followed by Dermabond.  An Aquacel Ag dressing was then applied. The patient was awoken and transported to the recovery room in stable condition.  In the recovery room, the patient had excellent palpable pedal pulses.     POSTOPERATIVE PLAN:  The patient will be admitted to the orthopedic floor and receive 24 hours  of prophylactic intravenous antibiotics.  DVT prophylaxis will include TED  hose and SCDs, and aspirin will be started on postoperative day #1.              Signed by: Cathey Endow, MD

## 2017-04-17 NOTE — Brief Op Note (Signed)
BRIEF OP NOTE    Date Time: 04/17/17 11:30 AM    Patient Name:   [name] [facility]    Date of Operation:   04/17/2017    Providers Performing:   Surgeon(s):  Cathey Endow, MD  Fitzmorris, Alycia Rossetti, [place]    Assistant (s):   Circulator: Imagene Riches, RN  Relief Scrub: Adline Peals, RN  Scrub Person: Allegra Grana    Operative Procedure:   Left total knee arthroplasty    Preoperative Diagnosis:   Pre-Op Diagnosis Codes:     * Osteoarthritis of left knee, unspecified osteoarthritis type [M17.12]    Postoperative Diagnosis:   Post-Op Diagnosis Codes:     * Osteoarthritis of left knee, unspecified osteoarthritis type [M17.12]    Anesthesia:   General  Adductor canal block catheter    Estimated Blood Loss:   Minimal    Implants:     Implant Name Type Inv. Item Serial No. Manufacturer Lot No. LRB No. Used Action   CEMENT COBALT HV HI CNTRST 40G - [id number] Cement CEMENT COBALT HV HI CNTRST 40G  [employer] [id number] Left 1 Implanted   CEMENT COBALT HV HI CNTRST 40G - [id number] Cement CEMENT COBALT HV HI CNTRST 40G  DJO [employer] [id number] Left 1 Implanted   PATELLA TRIATHLON X3 ASYMTRC - [id number] Patella PATELLA TRIATHLON X3 ASYMTRC  [employer] [id number] Left 1 Implanted   BASEPLATE PRIMARY TIBIAL SZ8 - [id number] Base BASEPLATE PRIMARY TIBIAL SZ8  [employer] C4P6S Left 1 Implanted   COMP TRIATH FMRL CEMENT CR 8 - [id number] Component COMP TRIATH FMRL CEMENT CR 8  [employer] [id number] Left 1 Implanted   TRIATHLON X3 TIBIAL BEARING INSERT - CR SZE 8       [employer] [id number] Left 1 Implanted       Drains:   Drains: no    Specimens:     ID Type Source Tests Collected by Time Destination   A : LEFT KNEE BONE AND SOFT TISSUE Not Applicable Knee SURGICAL PATHOLOGY Cathey Endow, MD 04/17/2017 1032          Findings:   Advanced osteoarthritis    Complications:   None      Signed by: Cathey Endow, MD                                                                           [place] [facility]  [facility]

## 2017-04-17 NOTE — Progress Notes (Signed)
Pt came to pre-op JIM class on 6/25 with coach at side. Completed post-test with no deficiencies. Opportunity for questions during and after class; pt and coach verbalized understanding of material. Surgery scheduled for 7/16 with Dr. Anise Salvo

## 2017-04-17 NOTE — Anesthesia Postprocedure Evaluation (Signed)
Anesthesia Post Evaluation    Patient: [name] [facility]    Procedures performed: Procedure(s) with comments:  ARTHROPLASTY, KNEE, TOTAL, COMPUTER NAVIGATION - LEFT TOTAL KNEE ARTHROPLASTY WITH NAVIGATION    Anesthesia type: General LMA    Patient location:PACU    Last vitals:   Vitals:    04/17/17 1515   BP: 114/55   Pulse: 72   Resp: 16   Temp: 36.4 C (97.5 F)   SpO2: 92%       Post pain: Patient not complaining of pain, continue current therapy      Mental Status:awake    Respiratory Function: tolerating nasal cannula    Cardiovascular: stable    Nausea/Vomiting: patient not complaining of nausea or vomiting    Hydration Status: adequate    Post assessment: no apparent anesthetic complications, no reportable events and no evidence of recall    Signed by: Martie Lee, 04/17/2017 3:15 PM

## 2017-04-17 NOTE — Interval H&P Note (Signed)
Seen and examined. H and P reviewed. No interval changes.    The patient is a 76 year old male with a history of   left knee osteoarthritis.  Pain has gotten progressively worse despite conservative measures. Pain is rated 7/10. Functional limitations include pain with walking, standing, kneeling, stairs.  Safety concerns include fear of falls.    The patient has taken NSAIDs for pain for years.  They have tried exercises in the form of home exercises for years. The patient has failed use of assistive devices including a cane. The patient has also tried injections with no significant long term improvement.    PE:    Left knee    - Alignment neutral  - Trace effusion  - Skin intact  - ROM 0-130  - Tenderness on palpation of the medial and lateral joint line.  - Crepitation with ROM  - NVI distally    - Antalgic gait    X-rays left knee: Joint space narrowing, subchondral sclerosis, osteophyte formation.    Impression: Left knee osteoarthritis    Plan:  The patient has tried extensive conservative management and continues to have progressively worsening pain.  The have elected to proceed with   left total knee arthroplasty. Further conservative treatment would not be expected to significantly improve symptoms.    Risks, benefits, and alternatives have been reviewed. The risks include, but are not limited to, bleeding, infection, neurovascular injury, persistent or worsening pain, instability, stiffness, fracture, loosening, wear, loss of limb, DVT, PE, and death. Benefits include potentially improved pain and function. Alternatives include rest, activity modification, PT, medication, injections.  The patient has indicated their understanding and agrees to proceed. Informed consent has been obtained and placed on the chart.

## 2017-04-17 NOTE — H&P (Signed)
ADMISSION HISTORY AND PHYSICAL EXAM    Date Time: 04/17/17 9:52 AM  Patient Name: [name]  Attending Physician: Cathey Endow, MD    Assessment:   Left knee osteoarthritis    Plan:   The patient has tried extensive conservative management and continues to have progressively worsening pain.  The have elected to proceed with   left total knee arthroplasty. Further conservative treatment would not be expected to significantly improve symptoms.    Risks, benefits, and alternatives have been reviewed. The risks include, but are not limited to, bleeding, infection, neurovascular injury, persistent or worsening pain, instability, stiffness, fracture, loosening, wear, loss of limb, DVT, PE, and death. Benefits include potentially improved pain and function. Alternatives include rest, activity modification, PT, medication, injections.  The patient has indicated their understanding and agrees to proceed. Informed consent has been obtained and placed on the chart.    History of Present Illness:   The patient is a 76 year old male with a history of   left knee osteoarthritis.  Pain has gotten progressively worse despite conservative measures. Pain is rated 7/10. Functional limitations include pain with walking, standing, kneeling, stairs.  Safety concerns include fear of falls.    The patient has taken NSAIDs for pain for years.  They have tried exercises in the form of home exercises for years. The patient has failed use of assistive devices including a cane. The patient has also tried injections with no significant long term improvement.    Past Medical History:     Past Medical History:   Diagnosis Date   . Abnormal vision     glasses   . Arthritis    . Disorder of prostate    . Ear, nose and throat disorder 2016    rhinitis (occasionally severe)   . Fracture 2013    left fibula   . Hyperlipidemia    . Hyperlipidemia    . 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 Neuropathy     neuralgia in  both legs    . Peripheral vascular disease 2016   . Peripheral vascular disease 2016, 2017, 2018   . Restless leg syndrome    . Sciatica    . Sleep apnea     documented w/ CPAP   . 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 Venous insufficiency of both lower extremities 12/20/2016       Past Surgical History:     Past Surgical History:   Procedure Laterality Date   . ANKLE FRACTURE SURGERY  2013   . ARTHROSCOPY, KNEE Left 03/22/2016    Procedure: ARTHROSCOPY, KNEE;  Surgeon: [name], MD;  Location: [facility];  Service: Orthopedics;  Laterality: Left;  LEFT KNEE PARTIAL MEDIAL AND LATERAL MENISCECTOMY   . COLONOSCOPY     . COSMETIC SURGERY      eye lift   . CYSTOSCOPY, TURP N/A 04/23/2015    Procedure: CYSTOSCOPY, TURP, BLADDER BIOPSY;  Surgeon: Jobe Gibbon, MD;  Location: [facility] OR;  Service: Urology;  Laterality: N/A;  CYSTOSCOPY, CLOT EVACUATION,  TURP, BLADDER BIOPSY w/FULGERATION   . FRACTURE SURGERY  2013   . HERNIA REPAIR     . KNEE ARTHROSCOPY W/ MENISCAL REPAIR Left 2012   . PROSTATE SURGERY  04/2015   . SKIN BIOPSY         Family History:   History reviewed. No pertinent family history.    Social History:  Social History     Social History   . Marital status: Married     Spouse name: N/A   . Number of children: N/A   . Years of education: N/A     Social History Main Topics   . Smoking status: Former Smoker     Packs/day: 0.25     Years: 25.00     Quit date: 03/21/1978   . Smokeless tobacco: Never Used   . Alcohol use 4.2 oz/week     12 Cans of beer per week   . Drug use: No   . Sexual activity: Yes     Partners: Female     Birth control/ protection: None     Other Topics Concern   . Not on file     Social History Narrative   . No narrative on file       Allergies:   No Known Allergies    Medications:     Prescriptions Prior to Admission   Medication Sig   . b complex vitamins tablet Take 1 tablet by mouth daily.   Marland Kitchen gabapentin (NEURONTIN) 300 MG capsule Take 1,200 mg by mouth nightly.       Marland Kitchen  glucosamine-chondroitin 500-400 MG tablet Take 1 tablet by mouth daily.   . Multiple Vitamins-Minerals (MULTIVITAMIN WITH MINERALS) tablet Take 1 tablet by mouth daily.   . rosuvastatin (CRESTOR) 5 MG tablet TAKE 1 TABLET(S) EVERY DAY BY ORAL ROUTE at bedtime   . rotigotine (NEUPRO) 6 MG/24HR patch Place 1 patch onto the skin daily.       . vitamin B-12 (CYANOCOBALAMIN) 100 MCG tablet Take 50 mcg by mouth daily.   . vitamin D (CHOLECALCIFEROL) 1000 UNIT tablet Take 1,000 Units by mouth daily.   . naproxen sodium (ANAPROX) 220 MG tablet Take 220 mg by mouth as needed.       Review of Systems:   A comprehensive review of systems was: positive for left knee pain    Physical Exam:     Vitals:    04/17/17 0856   BP: 133/72   Pulse: 60   Resp: 16   Temp: 97.8 F (36.6 C)   SpO2: 97%       Intake and Output Summary (Last 24 hours) at Date Time  No intake or output data in the 24 hours ending 04/17/17 0952      General: Well nourished, well developed, no acute distress  Lungs: Clear to auscultation  CV: Regular rate and rhythm  Abdomen: Soft, nonender    Left knee    - Alignment neutral  - Trace effusion  - Skin intact  - ROM 0-130  - Tenderness on palpation of the medial and lateral joint line.  - Crepitation with ROM  - NVI distally    - Antalgic gait    Labs:     Results     Procedure Component Value Units Date/Time    Type and Screen [[id number]] Collected:  04/17/17 0855    Specimen:  Blood Updated:  04/17/17 0905              Rads:   Radiological Procedure reviewed.    Signed by: Cathey Endow

## 2017-04-17 NOTE — Consults (Signed)
[name]  CONSULTATION      Patient: [name]  Date: 04/17/2017   DOB: May 11, 1941  Admission Date: 04/17/2017   MRN: [id number]  Attending: Cathey Endow, MD          History Gathered From: patient and record    HISTORY AND PHYSICAL     [name] is a 76 y.o. male with a PMHx of HLD, PVD, neuropathy, OSA, osteoarthritis who was admitted for left total knee arthroplasty by Dr. Anise Salvo. He tolerated the surgery without complications and reports controlled pain. He denies chest pain, SOB, or nausea. We have been consulted for a medical management of neuropathy and OSA.     Past Medical History:   Diagnosis Date   . Abnormal vision     glasses   . Arthritis    . Disorder of prostate    . Ear, nose and throat disorder 2016    rhinitis (occasionally severe)   . Fracture 2013    left fibula   . Hyperlipidemia    . Hyperlipidemia    . 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 Neuropathy     neuralgia in  both legs   . Peripheral vascular disease 2016   . Peripheral vascular disease 2016, 2017, 2018   . Restless leg syndrome    . Sciatica    . Sleep apnea     documented w/ CPAP   . 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 Venous insufficiency of both lower extremities 12/20/2016       Past Surgical History:   Procedure Laterality Date   . ANKLE FRACTURE SURGERY  2013   . ARTHROSCOPY, KNEE Left 03/22/2016    Procedure: ARTHROSCOPY, KNEE;  Surgeon: [name], MD;  Location: [facility];  Service: Orthopedics;  Laterality: Left;  LEFT KNEE PARTIAL MEDIAL AND LATERAL MENISCECTOMY   . COLONOSCOPY     . COSMETIC SURGERY      eye lift   . CYSTOSCOPY, TURP N/A 04/23/2015    Procedure: CYSTOSCOPY, TURP, BLADDER BIOPSY;  Surgeon: Jobe Gibbon, MD;  Location: [facility];  Service: Urology;  Laterality: N/A;  CYSTOSCOPY, CLOT EVACUATION,  TURP, BLADDER BIOPSY w/FULGERATION   . FRACTURE SURGERY  2013   . HERNIA REPAIR     . KNEE ARTHROSCOPY W/ MENISCAL REPAIR Left 2012   .  PROSTATE SURGERY  04/2015   . SKIN BIOPSY         Prior to Admission medications    Medication Sig Start Date End Date Taking? Authorizing Provider   b complex vitamins tablet Take 1 tablet by mouth daily.   Yes [provider]   gabapentin (NEURONTIN) 300 MG capsule Take 1,200 mg by mouth nightly.       Yes [provider]   glucosamine-chondroitin 500-400 MG tablet Take 1 tablet by mouth daily.   Yes [provider]   Multiple Vitamins-Minerals (MULTIVITAMIN WITH MINERALS) tablet Take 1 tablet by mouth daily.   Yes [provider]   rosuvastatin (CRESTOR) 5 MG tablet TAKE 1 TABLET(S) EVERY DAY BY ORAL ROUTE at bedtime 11/28/16  Yes [provider]   rotigotine (NEUPRO) 6 MG/24HR patch Place 1 patch onto the skin daily.     01/27/16  Yes [provider]   vitamin B-12 (CYANOCOBALAMIN) 100 MCG tablet Take 50 mcg by mouth daily.   Yes [provider]   vitamin  D (CHOLECALCIFEROL) 1000 UNIT tablet Take 1,000 Units by mouth daily.   Yes [provider]   naproxen sodium (ANAPROX) 220 MG tablet Take 220 mg by mouth as needed.    [provider]       No Known Allergies    CODE STATUS: full code    PRIMARY CARE MD: Donley Redder, MD    History reviewed. No pertinent family history.    Social History   Substance Use Topics   . Smoking status: Former Smoker     Packs/day: 0.25     Years: 25.00     Quit date: 03/21/1978   . Smokeless tobacco: Never Used   . Alcohol use 4.2 oz/week     12 Cans of beer per week       REVIEW OF SYSTEMS     All 10 systems reviewed and within limit except as noted above    PHYSICAL EXAM     Vital Signs (most recent): BP 114/55   Pulse 72   Temp 97.5 F (36.4 C) (Oral)   Resp 16   Ht 1.829 m (6')   Wt 100.9 kg (222 lb 8 oz)   SpO2 92%   BMI 30.18 kg/m   CONSTITUTIONAL: no acute distress, resting comfortably in bed, well developed and nourished  HEENT: normocephalic, clear conjunctiva, no nasal  discharge  NECK: Supple, midline trachea  CARDIOVASCULAR: RRR, normal S1, S2; No murmur, rub, gallop  LUNGS: normal rate and effort, clear to auscultation; No Wheeze, rhonchi, or rale  ABDOMEN: soft, non-tender, positive bowel sounds, no rebound or guarding  GENITOURINARY: no suprapubic or CVA tenderness  MUSCULOSKELETAL: left knee dressing dry and intact, intact pedal motor and sensation  EXTREMITIES: mild leg edema, no calf tenderness, intact pedal pulses  SKIN: no rash or ecchymosis  NEURO: A&Ox3, no focal neurological deficits  PSYCH: calm and cooperative    LABS & IMAGING     Recent Results (from the past 24 hour(s))   Type and Screen    Collection Time: 04/17/17  8:55 AM   Result Value Ref Range    ABO Rh O POS     AB Screen Gel NEG          IMAGING:  XR Knee 1 Or 2 Views Left   Final Result    Status post total left knee arthroplasty      J. Carole Binning, MD    04/17/2017 12:51 PM      US GUIDED NERVE BLOCK FOR ANESTHESIA   Final Result      US GUIDED NERVE BLOCK FOR ANESTHESIA   Final Result              ASSESSMENT & PLAN     [name] is a 76 y.o. male with PMHx of HLD, PVD, neuropathy, OSA, osteoarthritis who was admitted for left total knee arthroplasty. Consulted for a medical management of neuropathy.    Left knee osteoarthritis s/p TKA 7/16  - management per primary team  - DVT ppx with ASA 325mg  BID  - PT/OT, encourage IS  - pain management     HLD: resume crestor  PVD, neuropathy: cont neurontin  RLS: cont neupro  OSA: cont CPAP QHS    DVT prophylaxis:  CHEST guideline (See page [id number]) Chemical   Foley: Not present   IVs:  Peripheral IV   PT/OT: Ordered   Daily CBC & or Chem ordered:  SHM/ABIM guidelines (see #5) Yes, due to  clinical and lab instability     Thank you for the consult.        Signed,  Jena Gauss, NP  04/17/2017 4:06 PM

## 2017-04-17 NOTE — Addendum Note (Signed)
Addendum  created 04/17/17 1847 by Micki Riley, CRNA    Anesthesia Event edited

## 2017-04-17 NOTE — Transfer of Care (Signed)
Anesthesia Transfer of Care Note    Patient: [name] [facility]    Procedures performed: Procedure(s) with comments:  ARTHROPLASTY, KNEE, TOTAL, COMPUTER NAVIGATION - LEFT TOTAL KNEE ARTHROPLASTY WITH NAVIGATION    Anesthesia type: General LMA    Patient location:PACU    Last vitals:   Vitals:    04/17/17 0856   BP: 133/72   Pulse: 60   Resp: 16   Temp: 36.6 C (97.8 F)   SpO2: 97%       Post pain: Patient not complaining of pain, continue current therapy      Mental Status:awake    Respiratory Function: tolerating face mask    Cardiovascular: stable    Nausea/Vomiting: patient not complaining of nausea or vomiting    Hydration Status: adequate    Post assessment: no apparent anesthetic complications    Signed by: Zachary George Elysa Womac  04/17/17 11:55 AM

## 2017-04-18 ENCOUNTER — Encounter: Payer: Self-pay | Admitting: Orthopaedic Surgery

## 2017-04-18 ENCOUNTER — Other Ambulatory Visit: Payer: Self-pay

## 2017-04-18 LAB — BASIC METABOLIC PANEL
Anion Gap: 11 (ref 5.0–15.0)
BUN: 23 mg/dL (ref 9–28)
CO2: 22 mEq/L (ref 22–29)
Calcium: 8.8 mg/dL (ref 7.9–10.2)
Chloride: 106 mEq/L (ref 100–111)
Creatinine: 0.9 mg/dL (ref 0.7–1.3)
Glucose: 149 mg/dL — ABNORMAL HIGH (ref 70–100)
Potassium: 4.4 mEq/L (ref 3.5–5.1)
Sodium: 139 mEq/L (ref 136–145)

## 2017-04-18 LAB — HEMOGLOBIN AND HEMATOCRIT, BLOOD
Hematocrit: 36.4 % — ABNORMAL LOW (ref 42.0–52.0)
Hgb: 12 g/dL — ABNORMAL LOW (ref 13.0–17.0)

## 2017-04-18 LAB — GFR: EGFR: >60

## 2017-04-18 LAB — LAB USE ONLY - HISTORICAL SURGICAL PATHOLOGY

## 2017-04-18 MED ORDER — CALCIUM CARBONATE ANTACID 500 MG PO CHEW
1000.0000 mg | CHEWABLE_TABLET | Freq: Four times a day (QID) | ORAL | Status: DC | PRN
Start: 2017-04-18 — End: 2017-04-18
  Filled 2017-04-18: qty 2

## 2017-04-18 MED ORDER — SENNOSIDES-DOCUSATE SODIUM 8.6-50 MG PO TABS
2.0000 | ORAL_TABLET | Freq: Two times a day (BID) | ORAL | 0 refills | Status: DC | PRN
Start: 2017-04-18 — End: 2018-11-15

## 2017-04-18 MED ORDER — GABAPENTIN 300 MG PO CAPS
300.0000 mg | ORAL_CAPSULE | Freq: Every morning | ORAL | 0 refills | Status: AC
Start: 2017-04-18 — End: 2017-05-02

## 2017-04-18 MED ORDER — CELECOXIB 200 MG PO CAPS
200.0000 mg | ORAL_CAPSULE | Freq: Every day | ORAL | 0 refills | Status: AC
Start: 2017-04-18 — End: 2017-05-18

## 2017-04-18 MED ORDER — ACETAMINOPHEN 500 MG PO TABS
1000.0000 mg | ORAL_TABLET | Freq: Three times a day (TID) | ORAL | Status: DC
Start: 2017-04-18 — End: 2019-10-02

## 2017-04-18 MED ORDER — ASPIRIN EC 325 MG PO TBEC
325.0000 mg | DELAYED_RELEASE_TABLET | Freq: Two times a day (BID) | ORAL | 0 refills | Status: DC
Start: 2017-04-18 — End: 2018-11-15

## 2017-04-18 MED ORDER — OXYCODONE HCL 5 MG PO TABS
ORAL_TABLET | ORAL | 0 refills | Status: DC
Start: 2017-04-18 — End: 2018-11-15
  Filled 2017-04-18: qty 60, 10d supply, fill #0

## 2017-04-18 NOTE — UM Notes (Addendum)
PATIENT NAME: [name]   DOB: [birth year]   PMH:   Abnormal vision   Arthritis   Disorder of prostate   Ear, nose and throat disorder   Fracture   Hyperlipidemia   Hyperlipidemia   Neuromyopathy   Neuropathy   Peripheral vascular disease   Peripheral vascular disease   Restless leg syndrome   Sciatica   Sleep apnea   Vein disorder   Venous insufficiency of both lower extremities      ADMITTED ON: 04/17/2017 at 1637 for Inpatient   COPY OF INPATIENT ORDER:   Admit to Inpatient (Order [id number])   Admission   Date: 04/17/2017 Department: 5[facility] Orthopedics Released By: Leeann Must, RN (auto-released) Authorizing: Cathey Endow, MD   Order Information     Order Date/Time Release Date/Time Start Date/Time End Date/Time   04/17/17 04:37 PM 04/17/17 04:37 PM 04/17/17 04:37 PM 04/17/17 04:37 PM      ADMISSION DIAGNOSIS: Osteoarthritis of left knee, unspecified osteoarthritis type [M17.12]  Localized osteoarthritis of left knee [M17.12]   SURGICAL PROCEDURE(S) COMPLETED: Procedure(s):  ARTHROPLASTY, KNEE, TOTAL, COMPUTER NAVIGATION   NOTES TO REVIEWER:    This clinical review is based on/compiled from documentation provided by the treatment team within the patient's medical record.   UTILIZATION REVIEW CONTACT: Cherly Anderson. Barnie Alderman, RN, BSN  Clinical Case Manager  - Utilization Review  [facility]    [address]    [place], [place] [address]  NPI: [id number]  Tax ID: [id number]  Phone: [phone]  Fax: [phone]    Please use fax number [phone] to provide authorization for hospital services or to request additional information.        04/17/2017   Pt is a 76 y.o. male who arrived to the hospital for elective surgery.    TO OR FOR PROCEDURE. LABS:   None   MEDS:  Tylenol 1gm Q8H   Tylenol 1gm x 1   Ancef 2gm IV Q8H   Ancef 2gm IV x 1   Celebrex 200mg  Q12H   Celebrex 200mg  x 1   Neurontin 1.2gm QHS   Neurontin 300mg  x 1   Oxycontin 10mg  x 1   Crestor 5mg  QHS   Ultram 50mg  QID    Tranexamic Acid 1gm IV x 2     NS @ 87ml/hr     Oxycodone IR 10mg  Q3H PRN x 1   Oxycodone IR 5mg  Q3H PRN x 2  IMAGING:  XR Knee Lt   Status post total left knee arthroplasty     04/18/2017   Continues on Ortho/Surgical Unit, ongoing pain management.     Temp:  [96.1 F (35.6 C)-98.9 F (37.2 C)] 98.9 F (37.2 C)  Heart Rate:  [66-84] 81  Resp Rate:  [12-18] 15  BP: (108-138)/(55-78) 108/59    Attending Notes:  Physical therapy  DVT prophylaxis: Early mobilization, sequential compression devices, aspirin  D/C planning LABS:    04/18/2017 03:34   Hemoglobin 12.0 (L)   Hematocrit 36.4 (L)   Glucose 149 (H)      MEDS:  Tylenol 1gm Q8H   ASA 325mg  BID   Ancef 2gm IV Q8H   Celebrex 200mg  Q12H   Ferrous Sulfate 324mg  QAM   Neurontin 1.2gm QHS   Neurontin 300mg  QD   Crestor 5mg  QHS   Ultram 50mg  QID     NS @ 47ml/hr     Oxycodone IR 5mg  Q3H PRN x 1  IMAGING:  None

## 2017-04-18 NOTE — PT Progress Note (Signed)
Physical Therapy Note  [facility]  Physical Therapy Treatment    [address]  [place] [address]  [phone]    Patient:  [name] MRN#:  [id number]  Unit:  5NEW ORTHOPEDICS Room/Bed:  R518/A416-60    Time of treatment: Start Time: 0855 Stop Time: 0920  Time Calculation (min): 25 min  PT Received On: 04/18/17    Treatment #      Precautions  Weight Bearing Status: LLE WBAT    Patient's medical condition is appropriate for Physical Therapy intervention at this time.     Subjective: Patient is agreeable to participation in the therapy session.  Pain: 3/10 knee c ROM  Objective:Exercise: ankle pumps, quad sets, gluteal sets, hip abduction, heel slides, short-arc quads, straight leg raises,knee flex FOM, and hamstring stretch.  Ind SLR knee flex ~0-110  Sit to stand: ind  Ambulation: 150 c standard walker then 150 c one crutch    Stairs:ind one crutch and rail x 8 steps  Education:  Educated the patient and/or coach in precautions, home safety, home exercise, use of ice, car and bathroom transfers. Demonstrated good understanding of all.    Assessment: Patient and coach instructed in total joint exercise program.  Required verbal and tactile cues for technique.  Patient met shor termphysical therapy goals.    PMP - Progressive Mobility Protocol   PMP Activity: Step 7 - Walks out of Room  Distance Walked (ft) (Step7 150x 2   RN notified of session outcome.     Plan: [place] c outpt PT fu    Tiburcio Pea, PT

## 2017-04-18 NOTE — Progress Notes (Signed)
Pt ok for discharge per MD. Received and complies with all discharge instructions, prescriptions and follow up care. PIV removed without difficulty. Sabine'd via wheelchair w/ family and all belongings.

## 2017-04-18 NOTE — Plan of Care (Signed)
Problem: Knee Surgery  Goal: Pain at adequate level as identified by patient  Outcome: Progressing   04/18/17 0214   Goal/Interventions addressed this shift   Pain at adequate level as identified by patient  Identify patient comfort function goal;Evaluate if patient comfort function goal is met;Administer analgesics as prescribed to achieve pain goal     Goal: Stable Neurovascular Status  Outcome: Progressing   04/18/17 0214   Goal/Interventions addressed this shift   Stable neurovascular status  Assess and document plantar/dorsiflexion every 4 hours;Monitor/assess neurovascular status (pulses, capillary refill, pain, paresthesia, presence of edema);Monitor/assess for return of sensation after nerve block therapy if indicated;VTE prevention: administer anticoagulant(s) and/or apply anti-embolism stockings/devices as ordered     Goal: Mobility/activity is maintained at optimum level for patient  Outcome: Progressing  Pt OOB ambulating in hall >135ft, with walker and x1 person assist   04/18/17 0214   Goal/Interventions addressed this shift   Mobility/activity is maintained at optimum level for patient Utilize special equipment (trapeze, abduction pillow, regular pillow, walker) as needed and as ordered;Review weight bearing status with patient/patient care companion;Teach/review/reinforce exercises (ankle pumps, quad sets, gluteal sets);Teach/review/reinforce knee precautions with patient/patient care companion (no pillow under knee, lock out knee flexion feature on bed);Dangle/stand at bedside if indicated with assistance as needed;Out of bed to chair if indicated with assistance as needed;Ambulate equal to or greater than 50 feet

## 2017-04-18 NOTE — Progress Notes (Signed)
PROGRESS NOTE    Date Time: 04/18/17 8:17 AM  Patient Name: [name], [name]      Assessment:   POD #1 status post left total knee arthroplasty    Plan:   Physical therapy  DVT prophylaxis: Early mobilization, sequential compression devices, aspirin  D/C planning: D/C home today    Subjective:   Doing great. No complaints. Pain controlled. Walking in halls. Would like to go home.     Physical Exam:     Vitals:    04/18/17 0753   BP: 108/59   Pulse: 81   Resp: 15   Temp: 98.9 F (37.2 C)   SpO2: 91%       Intake and Output Summary (Last 24 hours) at Date Time    Intake/Output Summary (Last 24 hours) at 04/18/17 0817  Last data filed at 04/18/17 0547   Gross per 24 hour   Intake             2570 ml   Output             1300 ml   Net             1270 ml       Left knee  - Aquacel Ag in place.  No erythema or drainage.  - Calf soft and non-tender.  - Palpable dorsalis pedis and posterior tibial pulse  - Sensation to light touch intact throughout the right foot  - Motor: Intact toe flexion/extension and ankle dorsiflexion/plantarflexion      Labs:     Results     Procedure Component Value Units Date/Time    GFR [[id number]] Collected:  04/18/17 0334     Updated:  04/18/17 0500     EGFR >60.0    Basic metabolic panel [[id number]]  (Abnormal) Collected:  04/18/17 0334    Specimen:  Blood Updated:  04/18/17 0500     Glucose 149 (H) mg/dL      BUN 23 mg/dL      Creatinine 0.9 mg/dL      Calcium 8.8 mg/dL      Sodium 952 mEq/L      Potassium 4.4 mEq/L      Chloride 106 mEq/L      CO2 22 mEq/L      Anion Gap 11.0    Hemoglobin and hematocrit, blood [[id number]]  (Abnormal) Collected:  04/18/17 0334    Specimen:  Blood Updated:  04/18/17 0433     Hgb 12.0 (L) g/dL      Hematocrit 02.7 (L) %     Type and Screen [[id number]] Collected:  04/17/17 0855    Specimen:  Blood Updated:  04/17/17 0955     ABO Rh O POS     AB Screen Gel NEG              Signed by: Cathey Endow

## 2017-04-18 NOTE — Progress Notes (Signed)
CM role and services introduced to patient and spouse at the bedside. Patient lives at home with spouse and has no history of SNF/Rehab. [place] plan home with spouse, he has outpatient apt arranged for tomorrow. His wife will transport. No DME needs.    Max Fickle RN BSN CMSRN   Case Manager ext [phone]       04/18/17 1110   Patient Type   Within 30 Days of Previous Admission? No   Healthcare Decisions   Interviewed: Patient;Spouse   Orientation/Decision Estate manager/land agent of Patient Alert and Oriented x3, able to make decisions   Prior to admission   Have running water, electricity, heat, etc? Yes   How do you get to your MD appointments? spouse   How do you get your groceries? spouse   Who fixes your meals? spouse   Who does your laundry? spouse   Who picks up your prescriptions? spouse   Name of Prior Assisted Living Facility na   Home Care/Community Services None   Prior SNF admission? (Detail) na   Prior Rehab admission? (Detail) na   Adult Protective Services (APS) involved? No   Discharge Planning   Anticipated [place] plan discussed with: Same as interviewed;Spouse   Mode of transportation: Private car (family member)   Consults/Providers   Outcome Palliative Care Screen Screened but did not meet criteria for intervention   Correct PCP listed in Epic? Yes   Important Message from [facility] Notice   Patient received 1st IMM Letter? No

## 2017-04-18 NOTE — Discharge Instr - AVS First Page (Addendum)
Reason for your Hospital Admission:  Knee replacement      Instructions for after your discharge:  Activity   Start range of motion exrcises at home.   Don't drive until your doctor says it's OK. And never drive while taking opioid pain medication.   Remember to take pain medications as directed.   Do not drink alcohol while taking pain medications.   You can weight bear as tolerated. Gradually wean from walker to a cane under the direction of your physical therapist.   Slowly bend and straighten your affected knee as far as you can. Do this several times a day.   When resting, keep your ankle elevated above the level of your heart. This helps keep swelling down.   Point and flex your foot, and rotate your ankle as much as possible during the first few weeks following surgery. Also, wiggle your toes as much as possible.   Use portable sequential compression devices as instructed for 2 weeks.    Incision care   You have a waterproof dressing in place.      You can shower with the waterproof dressing in place.   Please remove the waterproof dressing 7 days after you are discharged form the hospital.  At that time, the incision can be left open to air and does not need to be covered.   After the waterproof dressing is removed, you can get the incision wet in the shower.   Use an ice packor bag of frozen peas--or something similar--wrapped in a thin towel to reduce the swelling. Keep the foot elevated while you ice the knee. Apply the ice pack for46minutes; then remove it for46minutes. Repeat as needed. Icing helps reduce swelling.    Medication   Take pain medication as prescribed.   Take the anticoagulant (blood thinner) medication as prescribed.   If you need any pain medication refills, please contact the office 48 hours prior to running out of the medication. Refills are not done on weekends by the on call physician.  If you are going to run out of medication over the weekend, please contact  the office on Thursday or Friday to obtain refills.    Other precautions   Arrange your household to keep the items you need within reach.   Remove throw rugs, electrical cords, and anything else that may cause you to fall.   Use nonslip bath mats, grab bars, an elevated toilet seat, and a shower chair in your bathroom.   Use a cane, crutches, a walker, or handrails until your balance, flexibility, and strength improve, and you can put weight on your leg. And remember to ask for help from others when you need it.   Free up your hands so that you can use them to keep balance. Use a fanny pack, apron, or pockets to carry things.    Follow-up     Physical therapy has been ordered. Start physical therapy as soon as possible.   Follow-up with Dr. Anise Salvo at scheduled appointment 2 weeks post-op.    Notify Dr. Anise Salvo for any increased bleeding, redness, drainage, swelling, worsening pain, calf pain or swelling, or other concerning symptoms.      Report to the emergency room immediately for any chest pain or shortness of breath.     General Guidelines After Your Joint Replacement    Prevention and Recognition of Blood Clots  1. Remember to take your blood thinner medication (ie, Xarelto, aspirin) as directed for as long as your  surgeon has prescribed it.  2. Move! Pump your ankles when you're in the bed or chair, and take frequent short walks as tolerated. If your surgeon sent you home with the white TED stockings, wear them throughout the day and especially at night, taking them off only to shower. They are machine washable (delicate cycle).  3. Unrelieved pain in either calf, particularly if accompanied by lower leg swelling, tightness, redness, and warmth or heat in the area can indicate a possible blood clot. If this occurs, call your surgeon's office or visit the nearest emergency room for evaluation.  4. Chest pain, shortness of breath, fast heart rate and/or a "feeling of impending doom" can signify a blood  clot that has traveled to the heart or lungs. Call 911 immediately!    Prevention and Recognition of Infection  1. Follow your surgeon's guidelines for showering and changing the bandage. Make sure whoever changes the bandage thoroughly washes his/her hands, including nailbeds, with soap and water before beginning the procedure.  2. Do not touch or rub the incision or the gauze area of the bandage.  3. Do not soak in a bathtub, swim or otherwise immerse the incision area in water until cleared by your surgeon.  4. Do not apply any ointments, including lotions or even an antibiotic solution such as Bacitracin, until cleared by your surgeon.  5. Keep pets and other possible contaminants away from your incision!  6. Signs of infection include sudden and significantly increased swelling and/or throbbing pain even at rest at the incision site; angry, deep-red color; heat; and/or pus-like discharge. You may or may not have an elevated temperature. If any of these signs occur, call your surgeon's office. Note that some degree of redness and warmth around the incision are normal after surgery.  7. Extensive bruising is normal, and as you move around more may extend all the way down your leg as it follows gravity.  8. Be proactive about other possible infections. If you think you have an abscessed tooth, urinary tract or other kind of infection, visit your primary care doctor or dentist right away instead of taking a "wait-and-see" approach.  9. You should no longer use the Hibiclens antibacterial soap that you used prior to surgery.    Alleviating Constipation  1. It will most likely be a few days before you have your first bowel movement after surgery, due to the anesthesia, your relative immobility, and the effects of pain medication. To help counteract, stay hydrated -- prune juice and hot liquids such as coffee or tea can help get the bowels moving. Also take an over-the-counter stool softener and/or gentle laxative as  directed by your surgeon.  2. Move! Walking will help get your bowels moving.  3. Some people report relief by placing a warm heating pad over their lower belly while resting.  4. If you are still unable to have a bowel movement, contact your surgeon's office.  5. If you become unable to keep down any food that you eat, are unable to pass gas, and your abdomen becomes increasingly distended or swollen, go to the nearest emergency room.    Pain  1. Stay on top of your pain medication. Once your pain level gets too high, it's hard to play catch up with your pain medications. Definitely take it before your physical therapy appointments! (Do not take narcotics if you are driving yourself to your therapy!)  2. Keep ice on the joint 20 minutes at a time throughout  the day. Make sure that you have a layer of clothing or towel between the ice pack and your skin. If you have had a nerve block, the ice may not feel as cold as it actually is.  3. You can make your own ice packs by pouring one or two bottles of clear Karo syrup (from the baking aisle) into a gallon-size heavy duty freezer bag, placing that inside of another freezer bag to prevent leaks, and placing it in the freezer. This will stay cold for a long time and forms a nice, thick heavy gel. You can also use equal parts rubbing alcohol and water.    Please call your surgeon's office with any medical questions or concerns before going to the emergency room, unless a genuine emergency.   For general questions, contact the orthopedic navigator, Lendon Collar ([phone] Monday through Friday, or email [email or web address]).  IN CASE OF EMERGENCY, DIAL 911!

## 2017-04-18 NOTE — OT Eval Note (Signed)
[facility]   Occupational Therapy Evaluation     Patient: [name]    MRN#: [id number]   Unit: 5NEW ORTHOPEDICS  Bed: O962/X528-41    Time of treatment: Time Calculation  OT Received On: 04/18/17  Start Time: 0831  Stop Time: 0850  Time Calculation (min): 19 min    Consult received for [name] for OT Evaluation and Treatment.  Patient's medical condition is appropriate for Occupational therapy intervention at this time.    Medicare ID# - Medicare Sub. Num: [id number]    Assessment:   Pt is independent with self-care. No con't OT needs.   Discharge from therapy.    Interdisciplinary Communication   Patient is in bedside chair and call bell/bed alarm set within reach.  Spoke with RN regarding results of evaluation.    Plan:   Discharge from Occupational Therapy.    Education:   Ed pt and spouse on role of OT and home safety.    Evaluation:     Precautions and Contraindications:   Precautions  Weight Bearing Status: LLE WBAT    Medical Diagnosis: Osteoarthritis of left knee, unspecified osteoarthritis type [M17.12]  Localized osteoarthritis of left knee [M17.12]    History of Present Illness: [name] is a 76 y.o. male admitted on 04/17/2017 with L TKR.    Patient Active Problem List   Diagnosis   . Dizziness and giddiness   . Gross hematuria   . Tear of medial meniscus of left knee   . Venous insufficiency of both lower extremities   . Localized osteoarthritis of left knee        Past Medical/Surgical History:  Past Medical History:   Diagnosis Date   . Abnormal vision     glasses   . Arthritis    . Disorder of prostate    . Ear, nose and throat disorder 2016    rhinitis (occasionally severe)   . Fracture 2013    left fibula   . Hyperlipidemia    . Hyperlipidemia    . 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 Neuropathy     neuralgia in  both legs   . Peripheral vascular disease 2016   . Peripheral vascular disease 2016, 2017, 2018   . Restless leg syndrome     . Sciatica    . Sleep apnea     documented w/ CPAP   . 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 Venous insufficiency of both lower extremities 12/20/2016      Past Surgical History:   Procedure Laterality Date   . ANKLE FRACTURE SURGERY  2013   . ARTHROSCOPY, KNEE Left 03/22/2016    Procedure: ARTHROSCOPY, KNEE;  Surgeon: [name], MD;  Location: [facility];  Service: Orthopedics;  Laterality: Left;  LEFT KNEE PARTIAL MEDIAL AND LATERAL MENISCECTOMY   . COLONOSCOPY     . COSMETIC SURGERY      eye lift   . CYSTOSCOPY, TURP N/A 04/23/2015    Procedure: CYSTOSCOPY, TURP, BLADDER BIOPSY;  Surgeon: Jobe Gibbon, MD;  Location: [facility];  Service: Urology;  Laterality: N/A;  CYSTOSCOPY, CLOT EVACUATION,  TURP, BLADDER BIOPSY w/FULGERATION   . FRACTURE SURGERY  2013   . HERNIA REPAIR     . KNEE ARTHROSCOPY W/ MENISCAL REPAIR Left 2012   . PROSTATE SURGERY  04/2015   . SKIN BIOPSY  Social History/Prior level of function:  Prior Level of Function  Prior level of function: Independent with ADLs  Baseline Activity Level: Community ambulation  Driving: independent  Home Living Arrangements  Living Arrangements: Spouse/significant other  Type of Home: House  Home Layout: One level  Bathroom Shower/Tub: Pension scheme manager: Raised    Orientation/Cognition: alert and oriented x4    Pain: no denies pain, ice pack applied LLE knee    Gross UE ROM: BUE AROM WFL's    Gross UE strength: BUE strength WFL's    ADLs: UB/LB ADL's WFLs'      Functional mobility: ADL txfrs Modified independent with RW      Signature: Raechel Chute, MOT, OTR/L

## 2017-04-18 NOTE — Progress Notes (Signed)
[facility]  HOSPITALIST  PROGRESS NOTE      Patient: [name]  Date: 04/18/2017   LOS: 1 Days  Admission Date: 04/17/2017   MRN: [id number]  Attending: Cathey Endow, MD     ASSESSMENT/PLAN   Mykell Rawl is a 76 y.o. male with PMHx of HLD, PVD, neuropathy, OSA, osteoarthritis who was admitted for left total knee arthroplasty. Consulted for a medical management of neuropathy.    Left knee osteoarthritis s/p TKA 7/16  - management per primary team  - DVT ppx with ASA 325mg  BID  - PT/OT, encourage IS  - pain management     HLD: cont crestor  PVD, chronic leg swelling, neuropathy: cont neurontin, plan on outpatient procedure for collapsed veins  RLS: cont neupro  OSA: cont CPAP QHS    DVT prophylaxis:  CHEST guideline (See page [id number]) Chemical   Foley: Not present   IVs:  Peripheral IV   PT/OT: Ordered   Daily CBC & or Chem ordered:  SHM/ABIM guidelines (see #5) Yes, due to clinical and lab instability         Code Status: full code    DISPO: home with family today per ortho, stable for discharge from medical standpoint as well    Family Contact: wife    Care Plan discussed with nursing and wife     SUBJECTIVE     Feels well  Minimal pain in knee  Denies chest pain, SOB, or nausea  Tolerated therapy    MEDICATIONS     Current Facility-Administered Medications   Medication Dose Route Frequency   . acetaminophen  1,000 mg Oral Q8H SCH   . aspirin EC  325 mg Oral BID   . celecoxib  200 mg Oral Q12H SCH   . ferrous sulfate  324 mg Oral QAM W/BREAKFAST   . gabapentin  1,200 mg Oral QHS   . gabapentin  300 mg Oral Daily   . NON-FORMULARY PAT OWN MED order form  1 patch Apply externally Daily   . rosuvastatin  5 mg Oral QHS   . senna-docusate  2 tablet Oral BID   . traMADol  50 mg Oral 4 times per day   . vitamin C  500 mg Oral QAM   . vitamin D  1,000 Units Oral Daily   . vitamins/minerals  1 tablet Oral QAM       ROS     All 10 systems reviewed and within limit except as noted above    PHYSICAL  EXAM     Vitals:    04/18/17 0753   BP: 108/59   Pulse: 81   Resp: 15   Temp: 98.9 F (37.2 C)   SpO2: 91%       Temperature: Temp  Min: 96.1 F (35.6 C)  Max: 98.9 F (37.2 C)  Pulse: Pulse  Min: 66  Max: 84  Respiratory: Resp  Min: 12  Max: 18  Non-Invasive BP: BP  Min: 108/59  Max: 138/67  Pulse Oximetry SpO2  Min: 91 %  Max: 98 %    Intake and Output Summary (Last 24 hours) at Date Time    Intake/Output Summary (Last 24 hours) at 04/18/17 0931  Last data filed at 04/18/17 0547   Gross per 24 hour   Intake             2570 ml   Output             1300 ml  Net             1270 ml       CONSTITUTIONAL: no acute distress, resting comfortably in bed, well developed and nourished  HEENT: normocephalic, clear conjunctiva  NECK: Supple, midline trachea  CARDIOVASCULAR: RRR, normal S1, S2; No murmur, rub, gallop  LUNGS: normal rate and effort, clear to auscultation; No Wheeze, rhonchi, or rale  ABDOMEN: soft, non-tender, positive bowel sounds, no rebound or guarding  GENITOURINARY: no suprapubic or CVA tenderness  MUSCULOSKELETAL: left knee dressing dry and intact, intact pedal motor and sensation  EXTREMITIES: mild BLE edema, no calf tenderness, intact pedal pulses  SKIN: no rash or ecchymosis  NEURO: A&Ox3, no focal neurological deficits  PSYCH: calm and cooperative    LABS       Recent Labs  Lab 04/18/17  0334   Hgb 12.0*   Hematocrit 36.4*         Recent Labs  Lab 04/18/17  0334   Sodium 139   Potassium 4.4   Chloride 106   CO2 22   BUN 23   Creatinine 0.9   Glucose 149*   Calcium 8.8                         Microbiology Results     None           RADIOLOGY     Radiological Procedure reviewed.    XR Knee 1 Or 2 Views Left   Final Result    Status post total left knee arthroplasty      J. Carole Binning, MD    04/17/2017 12:51 PM      US GUIDED NERVE BLOCK FOR ANESTHESIA   Final Result      US GUIDED NERVE BLOCK FOR ANESTHESIA   Final Result          Signed,  Jena Gauss, NP  9:31 AM 04/18/2017

## 2017-04-19 NOTE — Discharge Summary (Signed)
Physician Discharge Summary   Patient ID:  [name]  [id number]  76 y.o.  [birth year]    Admit date: 04/17/2017    Discharge date and time: 04/18/2017 10:26 AM     Admitting Physician: Cathey Endow, MD     Admission Diagnoses: Left knee osteoarthritis    Discharge Diagnoses: Left knee osteoarthritis    Operative Procedures: Left total knee arthroplasty    Indication for Admission: The patient has a history of left knee osteoarthritis that has become progressively worse and the patient has elected to proceed with left total knee arthroplasty.    Hospital Course: The patient was admitted to the hospital on 04/17/2017 and underwent left total knee arthroplasty. Post-operatively, the patient was transferred to the orthopaedic floor.  They received 24 hours of prophylactic intravenous antibiotics.  DVT prophylaxis included TED hose, Av foot pumps, early ambulation, and aspirin was started on POD #1.      The patient progressed well throughout the hospitalization and was deemed stable for discharge on 04/18/2017 .      Disposition: Home or Self Care    Discharge Medications:    Discharge Medication List as of 04/18/2017  9:43 AM      START taking these medications    Details   acetaminophen (TYLENOL) 500 MG tablet Take 2 tablets (1,000 mg total) by mouth every 8 (eight) hours., Starting Tue 04/18/2017, No Print      aspirin 325 MG EC tablet Take 1 tablet (325 mg total) by mouth 2 (two) times daily., Starting Tue 04/18/2017, No Print      celecoxib (CELEBREX) 200 MG capsule Take 1 capsule (200 mg total) by mouth daily., Starting Tue 04/18/2017, Until Thu 05/18/2017, No Print      !! gabapentin (NEURONTIN) 300 MG capsule Take 1 capsule (300 mg total) by mouth every morning.for 14 days, Starting Tue 04/18/2017, Until Tue 05/02/2017, Print      oxyCODONE (ROXICODONE) 5 MG immediate release tablet Take 1-2 tablets by mouth every 4-6 hours as needed for pain, Print      senna-docusate (PERICOLACE) 8.6-50 MG per tablet Take 2  tablets by mouth 2 (two) times daily as needed for Constipation., Starting Tue 04/18/2017, No Print       !! - Potential duplicate medications found. Please discuss with provider.      CONTINUE these medications which have NOT CHANGED    Details   b complex vitamins tablet Take 1 tablet by mouth daily., Historical Med      !! gabapentin (NEURONTIN) 300 MG capsule Take 1,200 mg by mouth nightly.    , Historical Med      Multiple Vitamins-Minerals (MULTIVITAMIN WITH MINERALS) tablet Take 1 tablet by mouth daily., Historical Med      rosuvastatin (CRESTOR) 5 MG tablet TAKE 1 TABLET(S) EVERY DAY BY ORAL ROUTE at bedtime, Historical Med      rotigotine (NEUPRO) 6 MG/24HR patch Place 1 patch onto the skin daily.    , Starting Wed 01/27/2016, Historical Med      vitamin D (CHOLECALCIFEROL) 1000 UNIT tablet Take 1,000 Units by mouth daily., Historical Med       !! - Potential duplicate medications found. Please discuss with provider.      STOP taking these medications       glucosamine-chondroitin 500-400 MG tablet        naproxen sodium (ANAPROX) 220 MG tablet        vitamin B-12 (CYANOCOBALAMIN) 100 MCG tablet  Patient Instructions:     The patient will notify me for any increased bleeding, redness, drainage, swelling, worsening pain, calf pain or swelling, or other concerning symptoms.  They have been instructed to report to the emergency room immediately for any chest pain or shortness of breath.   Physical therapy has been ordered.  Activity:The patient is weight bearing as tolerated on the operative extremity.  Wound Care: The patient has a waterproof dressing in place.  They can shower with the dressing in place.  They will remove the dressing in 1 week. At that time, they can get the incision wet in the shower, and will pat dry afterwards.  Follow-up with Dr. Anise Salvo at scheduled appointment 2 weeks post-op.    Signed:  Cathey Endow  04/19/2017  10:23 AM

## 2017-04-28 ENCOUNTER — Encounter (INDEPENDENT_AMBULATORY_CARE_PROVIDER_SITE_OTHER): Payer: Self-pay | Admitting: Specialist

## 2017-05-26 ENCOUNTER — Encounter (INDEPENDENT_AMBULATORY_CARE_PROVIDER_SITE_OTHER): Payer: Self-pay | Admitting: Specialist

## 2017-06-19 ENCOUNTER — Other Ambulatory Visit: Payer: Self-pay | Admitting: Neurology

## 2017-06-19 ENCOUNTER — Ambulatory Visit: Payer: Medicare (Managed Care) | Attending: Neurology

## 2017-06-19 DIAGNOSIS — I6782 Cerebral ischemia: Secondary | ICD-10-CM | POA: Insufficient documentation

## 2017-06-19 DIAGNOSIS — G319 Degenerative disease of nervous system, unspecified: Secondary | ICD-10-CM | POA: Insufficient documentation

## 2017-06-19 DIAGNOSIS — R269 Unspecified abnormalities of gait and mobility: Secondary | ICD-10-CM

## 2017-06-28 ENCOUNTER — Encounter (INDEPENDENT_AMBULATORY_CARE_PROVIDER_SITE_OTHER): Payer: Self-pay | Admitting: Specialist

## 2017-07-04 ENCOUNTER — Encounter (INDEPENDENT_AMBULATORY_CARE_PROVIDER_SITE_OTHER): Payer: Self-pay | Admitting: Specialist

## 2017-07-10 ENCOUNTER — Ambulatory Visit: Payer: Medicare (Managed Care)

## 2017-07-10 NOTE — Pre-Procedure Instructions (Signed)
No orders/testing noted.

## 2017-07-18 ENCOUNTER — Ambulatory Visit (INDEPENDENT_AMBULATORY_CARE_PROVIDER_SITE_OTHER): Payer: Medicare (Managed Care) | Admitting: Specialist

## 2017-07-18 ENCOUNTER — Encounter (INDEPENDENT_AMBULATORY_CARE_PROVIDER_SITE_OTHER): Payer: Self-pay | Admitting: Specialist

## 2017-07-18 VITALS — BP 121/77 | HR 73 | Ht 73.0 in | Wt 218.0 lb

## 2017-07-18 DIAGNOSIS — I872 Venous insufficiency (chronic) (peripheral): Secondary | ICD-10-CM

## 2017-07-18 NOTE — Progress Notes (Signed)
[facility]    Chief Complaint   Patient presents with   . Follow-up     discuss phlebectomy         History of Present Illness     [name] is a 76 y.o. male who presents Status post right greater saphenous ablation therapy.  Patient has had significant improvement, however, still has pain in the varicosities of both lower extremities.  And because of failure of conservative therapy.  He will elect to proceed with ambulatory phlebectomy.    Past Medical History     Past Medical History:   Diagnosis Date   . Abnormal vision     glasses   . Arthritis    . Disorder of prostate    . Ear, nose and throat disorder 2016    rhinitis (occasionally severe)   . Fracture 2013    left fibula   . Hyperlipidemia     controlled with medication   . Neuropathy     neuralgia in  both legs   . Peripheral vascular disease 2016, 2017, 2018   . Restless leg syndrome    . Sciatica    . Sleep apnea     documented w/ CPAP   . 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 Venous insufficiency of both lower extremities 12/20/2016       Allergies     No Known Allergies    Medications     Current Outpatient Prescriptions on File Prior to Visit   Medication Sig Dispense Refill   . acetaminophen (TYLENOL) 500 MG tablet Take 2 tablets (1,000 mg total) by mouth every 8 (eight) hours.     Marland Kitchen aspirin 325 MG EC tablet Take 1 tablet (325 mg total) by mouth 2 (two) times daily. 60 tablet 0   . b complex vitamins tablet Take 1 tablet by mouth daily.     Marland Kitchen gabapentin (NEURONTIN) 300 MG capsule Take 1,200 mg by mouth nightly.         . Melatonin 10 MG Tablet Dispersible Take by mouth nightly.     . modafinil (PROVIGIL) 200 MG tablet Take 200 mg by mouth as needed.     . Multiple Vitamins-Minerals (MULTIVITAMIN WITH MINERALS) tablet Take 1 tablet by mouth daily.     Marland Kitchen oxyCODONE (ROXICODONE) 5 MG immediate release tablet Take 1-2 tablets by mouth every 4-6 hours as needed for pain 60 tablet 0   . pregabalin (LYRICA) 50 MG capsule Take 50 mg by  mouth 3 (three) times daily.     . rosuvastatin (CRESTOR) 5 MG tablet TAKE 1 TABLET(S) EVERY DAY BY ORAL ROUTE at bedtime  1   . rotigotine (NEUPRO) 6 MG/24HR patch Place 1 patch onto the skin daily.         Marland Kitchen senna-docusate (PERICOLACE) 8.6-50 MG per tablet Take 2 tablets by mouth 2 (two) times daily as needed for Constipation. 100 tablet 0   . vitamin D (CHOLECALCIFEROL) 1000 UNIT tablet Take 1,000 Units by mouth daily.       No current facility-administered medications on file prior to visit.        Review of Systems     Constitutional: Negative for fevers and chills  Skin: No rash or lesions  Respiratory: Negative for cough, wheezing, or hemoptysis  Cardiovascular: as per HPI  Gastrointestinal: Negative for abdominal pain, nausea, vomiting and diarrhea  Musculoskeletal:  No arthritic symptoms  Genitourinary: Negative for dysuria  All other systems  were reviewed and are negative except what is stated in the HPI    Physical Exam     Vitals:    07/18/17 0848   BP: 121/77   Pulse: 73       Body mass index is 28.76 kg/m.    General: Patient appears their stated age, well-nourished. Alert and in no apparent distress.  Lungs: Respiratory effort unlabored, chest expansion symmetric.  Cardiac: RRR, no carotid bruits, no JVD.   Extremities warm, Right Femoral pulses 2+, Left Femoral 2+, Right popliteal 2+, Left popliteal 2+, Right DP 2+, Left DP 2+, Right PT 2+, Left PT 2+,   Abd: Soft, nondistended, nontender. No guarding or rebound, No mid line pulsatile mass   VQQ:VZDG ROM in all 4 extremities, symmetric , Varicosities, both lower extremities  Skin: Color appropriate for race, Skin warm, dry, no gangrene, no non healing ulcers,  Neuro: Good insight and judgment, oriented to person, place, and time CN II-XII intact, gross motor and sensory intact      Labs     CBC:   WBC   Date/Time Value Ref Range Status   07/27/2015 02:16 PM 5.15 3.50 - 10.80 x10 3/uL Final     RBC   Date/Time Value Ref Range Status   07/27/2015  02:16 PM 4.61 (L) 4.70 - 6.00 x10 6/uL Final     Hgb   Date/Time Value Ref Range Status   04/18/2017 03:34 AM 12.0 (L) 13.0 - 17.0 g/dL Final     Hematocrit   Date/Time Value Ref Range Status   04/18/2017 03:34 AM 36.4 (L) 42.0 - 52.0 % Final     MCV   Date/Time Value Ref Range Status   07/27/2015 02:16 PM 93.1 80.0 - 100.0 fL Final     MCHC   Date/Time Value Ref Range Status   07/27/2015 02:16 PM 34.0 32.0 - 36.0 g/dL Final     RDW   Date/Time Value Ref Range Status   07/27/2015 02:16 PM 14 12 - 15 % Final     Platelets   Date/Time Value Ref Range Status   07/27/2015 02:16 PM 188 140 - 400 x10 3/uL Final       CMP:   Sodium   Date/Time Value Ref Range Status   04/18/2017 03:34 AM 139 136 - 145 mEq/L Final     Potassium   Date/Time Value Ref Range Status   04/18/2017 03:34 AM 4.4 3.5 - 5.1 mEq/L Final     Chloride   Date/Time Value Ref Range Status   04/18/2017 03:34 AM 106 100 - 111 mEq/L Final     CO2   Date/Time Value Ref Range Status   04/18/2017 03:34 AM 22 22 - 29 mEq/L Final     Glucose   Date/Time Value Ref Range Status   04/18/2017 03:34 AM 149 (H) 70 - 100 mg/dL Final     Comment:     ADA guidelines for diabetes mellitus:  Fasting:  Equal to or greater than 126 mg/dL  Random:   Equal to or greater than 200 mg/dL       BUN   Date/Time Value Ref Range Status   04/18/2017 03:34 AM 23 9 - 28 mg/dL Final     Anion Gap   Date/Time Value Ref Range Status   04/18/2017 03:34 AM 11.0 5.0 - 15.0 Final       Lipid Panel No results found for: CHOL, TRIG, HDL    Coags: No results found for: PT,  INR, PTT    Assessment and Plan       1. Venous insufficiency of both lower extremities         My impression is that he has venous insufficiency status post ablation therapy of the right greater saphenous vein.  Because of pain in the right pulses of both lower extremities.  Proceed with bilateral microphlebectomy.  This was explained to the patient and has agreed.  The risks and benefits of the procedures were explained to the  patient and agreed. The risk of infection, bleeding, deep vein thrombosis, pulmonary embolism, nerve injury and other complications were explained in detail and agreed.     Patient understands that symptoms may not completely resolve with this procedure.    Romelle Starcher, MD, FACS, RPVI  [facility]  Chief, Section of Vascular Surgery  [place]  [facility]

## 2017-07-28 ENCOUNTER — Ambulatory Visit
Admission: RE | Admit: 2017-07-28 | Discharge: 2017-07-28 | Disposition: A | Discharge status: Home / Self Care | Payer: Medicare (Managed Care) | Source: Ambulatory Visit | Attending: Specialist | Admitting: Specialist

## 2017-07-28 ENCOUNTER — Ambulatory Visit: Payer: Medicare (Managed Care) | Admitting: Pain Medicine

## 2017-07-28 ENCOUNTER — Encounter
Admission: RE | Disposition: A | Discharge status: Home / Self Care | Payer: Self-pay | Source: Ambulatory Visit | Attending: Specialist

## 2017-07-28 DIAGNOSIS — G4730 Sleep apnea, unspecified: Secondary | ICD-10-CM | POA: Insufficient documentation

## 2017-07-28 DIAGNOSIS — G2581 Restless legs syndrome: Secondary | ICD-10-CM | POA: Insufficient documentation

## 2017-07-28 DIAGNOSIS — I83893 Varicose veins of bilateral lower extremities with other complications: Secondary | ICD-10-CM

## 2017-07-28 DIAGNOSIS — I83813 Varicose veins of bilateral lower extremities with pain: Secondary | ICD-10-CM | POA: Insufficient documentation

## 2017-07-28 DIAGNOSIS — I739 Peripheral vascular disease, unspecified: Secondary | ICD-10-CM | POA: Insufficient documentation

## 2017-07-28 DIAGNOSIS — I872 Venous insufficiency (chronic) (peripheral): Secondary | ICD-10-CM | POA: Insufficient documentation

## 2017-07-28 DIAGNOSIS — E785 Hyperlipidemia, unspecified: Secondary | ICD-10-CM | POA: Insufficient documentation

## 2017-07-28 DIAGNOSIS — G629 Polyneuropathy, unspecified: Secondary | ICD-10-CM | POA: Insufficient documentation

## 2017-07-28 HISTORY — PX: PHLEBECTOMY: SHX4972

## 2017-07-28 SURGERY — PHLEBECTOMY
Anesthesia: Anesthesia General | Site: Leg Upper | Laterality: Bilateral | Wound class: Clean

## 2017-07-28 MED ORDER — MIDAZOLAM HCL 2 MG/2ML IJ SOLN
INTRAMUSCULAR | Status: DC | PRN
Start: 2017-07-28 — End: 2017-07-28
  Administered 2017-07-28: 1 mg via INTRAVENOUS

## 2017-07-28 MED ORDER — LIDOCAINE HCL 2 % IJ SOLN
INTRAMUSCULAR | Status: DC | PRN
Start: 2017-07-28 — End: 2017-07-28
  Administered 2017-07-28: 60 mg

## 2017-07-28 MED ORDER — LIDOCAINE HCL 2 % IJ SOLN
INTRAMUSCULAR | Status: AC
Start: 2017-07-28 — End: ?
  Filled 2017-07-28: qty 20

## 2017-07-28 MED ORDER — PROPOFOL INFUSION 10 MG/ML
INTRAVENOUS | Status: DC | PRN
Start: 2017-07-28 — End: 2017-07-28
  Administered 2017-07-28: 120 ug/kg/min via INTRAVENOUS

## 2017-07-28 MED ORDER — FENTANYL CITRATE (PF) 50 MCG/ML IJ SOLN (WRAP)
25.0000 ug | INTRAMUSCULAR | Status: DC | PRN
Start: 2017-07-28 — End: 2017-07-28

## 2017-07-28 MED ORDER — MEPERIDINE HCL 25 MG/ML IJ SOLN
12.5000 mg | INTRAMUSCULAR | Status: DC | PRN
Start: 2017-07-28 — End: 2017-07-28

## 2017-07-28 MED ORDER — KETOROLAC TROMETHAMINE 30 MG/ML IJ SOLN
INTRAMUSCULAR | Status: DC | PRN
Start: 2017-07-28 — End: 2017-07-28
  Administered 2017-07-28: 30 mg via INTRAVENOUS

## 2017-07-28 MED ORDER — FENTANYL CITRATE (PF) 50 MCG/ML IJ SOLN (WRAP)
INTRAMUSCULAR | Status: DC | PRN
Start: 2017-07-28 — End: 2017-07-28
  Administered 2017-07-28 (×2): 12.5 ug via INTRAVENOUS
  Administered 2017-07-28: 25 ug via INTRAVENOUS

## 2017-07-28 MED ORDER — LACTATED RINGERS IV SOLN
INTRAVENOUS | Status: DC | PRN
Start: 2017-07-28 — End: 2017-07-28

## 2017-07-28 MED ORDER — FENTANYL CITRATE (PF) 50 MCG/ML IJ SOLN (WRAP)
INTRAMUSCULAR | Status: AC
Start: 2017-07-28 — End: ?
  Filled 2017-07-28: qty 2

## 2017-07-28 MED ORDER — PROPOFOL 10 MG/ML IV EMUL (WRAP)
INTRAVENOUS | Status: AC
Start: 2017-07-28 — End: ?
  Filled 2017-07-28: qty 40

## 2017-07-28 MED ORDER — PROPOFOL 10 MG/ML IV EMUL (WRAP)
INTRAVENOUS | Status: AC
Start: 2017-07-28 — End: ?
  Filled 2017-07-28: qty 20

## 2017-07-28 MED ORDER — ACETAMINOPHEN 325 MG PO TABS
650.0000 mg | ORAL_TABLET | Freq: Once | ORAL | Status: DC | PRN
Start: 2017-07-28 — End: 2017-07-28

## 2017-07-28 MED ORDER — MIDAZOLAM HCL 2 MG/2ML IJ SOLN
INTRAMUSCULAR | Status: AC
Start: 2017-07-28 — End: ?
  Filled 2017-07-28: qty 2

## 2017-07-28 MED ORDER — LACTATED RINGERS IV SOLN
INTRAVENOUS | Status: DC
Start: 2017-07-28 — End: 2017-07-28

## 2017-07-28 MED ORDER — PROPOFOL INFUSION 10 MG/ML
INTRAVENOUS | Status: DC | PRN
Start: 2017-07-28 — End: 2017-07-28
  Administered 2017-07-28: 30 mg via INTRAVENOUS

## 2017-07-28 MED ORDER — ONDANSETRON HCL 4 MG/2ML IJ SOLN
4.0000 mg | Freq: Once | INTRAMUSCULAR | Status: DC | PRN
Start: 2017-07-28 — End: 2017-07-28

## 2017-07-28 MED ORDER — PROMETHAZINE HCL 25 MG/ML IJ SOLN
6.2500 mg | Freq: Once | INTRAMUSCULAR | Status: DC | PRN
Start: 2017-07-28 — End: 2017-07-28

## 2017-07-28 MED ORDER — SODIUM CHLORIDE 0.9 % IR SOLN
Status: DC | PRN
Start: 2017-07-28 — End: 2017-07-28
  Administered 2017-07-28: 1000 mL

## 2017-07-28 MED ORDER — SODIUM CHLORIDE 0.9 % IV SOLN
INTRAVENOUS | Status: DC
Start: 2017-07-28 — End: 2017-07-28

## 2017-07-28 MED ORDER — HYDROMORPHONE HCL 0.5 MG/0.5 ML IJ SOLN
0.3000 mg | INTRAMUSCULAR | Status: DC | PRN
Start: 2017-07-28 — End: 2017-07-28

## 2017-07-28 SURGICAL SUPPLY — 27 items
ADHESIVE SKIN CLOSURE DERMABOND ADVANCED (Skin Closure) ×4
ADHESIVE SKIN CLOSURE DERMABOND ADVANCED .7 ML LIQUID APPLICATOR (Skin Closure) ×1 IMPLANT
ADHESIVE SKIN DERMABOND ADV (Skin Closure) ×4
APPLCATOR CHLORAPREP 26ML (Prep) ×4 IMPLANT
BANDAGE ACE STERILE 4IN LF (Bandage) ×1
BANDAGE ACE STERILE 6IN LF (Bandage) ×1
BANDAGE ELASTIC L5 YD X W6 IN W/SELF-CLOSURE HOOK (Bandage) IMPLANT
BANDAGE MEDIUM COMPRESSION L5 YD X W4 IN ELASTIC HOOK LOOP CLOSURE (Bandage) IMPLANT
BANDAGE MEDLINE MEDIUM COMPRESSION L5 YD (Bandage) ×2
COVER FLEXIBLE LIGHT HANDLE PLASTIC GREEN (Procedure Accessories) ×1 IMPLANT
COVER FLEXIBLE MEDLINE LIGHT HANDLE (Procedure Accessories) ×1
CVR LGHTHNDL FLEX SFT 1PK (Procedure Accessories) ×1
DRAPE PLASTIC U 60X72 (Drape)
DRAPE SRG TBRN CNVRT 72X60IN LF STRL U (Drape)
DRAPE SURGICAL U STRIP IMPERVIOUS ADHESIVE SPLIT L72 IN X W60 IN (Drape) ×1 IMPLANT
ELECTRODE ADULT PATIENT RETURN L9 FT REM POLYHESIVE ACRYLIC FOAM (Procedure Accessories) ×1 IMPLANT
ELECTRODE PATIENT RETURN L9 FT VALLEYLAB (Procedure Accessories)
GLOVE SURG BIOGEL LF SZ8 (Glove) ×2 IMPLANT
KIT INFECTION CONTROL CUSTOM (Kits) ×2
KIT INFECTION CONTROL CUSTOM IFOH03 (Kits) ×1 IMPLANT
MANIFOLD NEPTUNE II 4PORT SUCT (Filter) ×1
MANIFOLD SUCTION 2 STANDARD 4 PORT (Filter) ×1
MANIFOLD SUCTION 2 STANDARD 4 PORT NEPTUNE 2 WASTE MANAGEMENT SYSTEM (Filter) ×1 IMPLANT
PAD ELECTROSRG GRND REM W CRD (Procedure Accessories)
PHLEBECTOMY EVLT PACK FOAKS (Pack) ×2 IMPLANT
SPONGE GAUZE L4 IN X W4 IN 12 PLY (Sponge) IMPLANT
SPONGE GAUZE STR 10'S 12PLY4X4 (Sponge) ×1

## 2017-07-28 NOTE — Discharge Instructions (Signed)

## 2017-07-28 NOTE — Anesthesia Postprocedure Evaluation (Signed)
Anesthesia Post Evaluation    Patient: [name] [facility]    Procedures performed: Procedure(s):  PHLEBECTOMY BILATERAL LEG    Anesthesia type: General TIVA    Patient location:Phase II PACU    Last vitals:   Vitals:    07/28/17 1523   BP: 141/80   Pulse: 77   Resp: 18   Temp: 36.3 C (97.4 F)   SpO2: 99%       Post pain: Patient not complaining of pain, continue current therapy      Mental Status:awake    Respiratory Function: tolerating room air    Cardiovascular: stable    Nausea/Vomiting: patient not complaining of nausea or vomiting    Hydration Status: adequate    Post assessment: no apparent anesthetic complications    Signed by: Juanell Fairly, 07/28/2017 6:04 PM

## 2017-07-28 NOTE — H&P (Signed)
History and Physical currently available, in Epic or on paper, has been reviewed, and there are no major changes. Pt. seen and examined by me prior to procedure.     Felis Quillin A Kamonte Mcmichen, MD

## 2017-07-28 NOTE — Transfer of Care (Signed)
Anesthesia Transfer of Care Note    Patient: [name]    Procedures performed: Procedure(s):  PHLEBECTOMY BILATERAL LEG    Anesthesia type: General TIVA    Patient location:Phase II PACU    Last vitals:   Vitals:    07/28/17 1523   BP: 141/80   Pulse: 77   Resp: 18   Temp: 36.3 C (97.4 F)   SpO2: 99%       Post pain: Patient not complaining of pain, continue current therapy      Mental Status:awake and alert     Respiratory Function: tolerating room air    Cardiovascular: stable    Nausea/Vomiting: patient not complaining of nausea or vomiting    Hydration Status: adequate    Post assessment: no apparent anesthetic complications    Signed by: Dorian Pod  07/28/17 6:02 PM

## 2017-07-28 NOTE — Progress Notes (Signed)
Patient meet discharge criteria at this time, vital signs stable, no N/V, tolerating po, pain is controlled, Ace wrap to bilateral legs, CDI.  Discharge instructions provided and explained to patient and family, verbalized good understanding. Patient and family verbalized readiness to go home. Patient and family aware when to call and follow-up with the doctor.

## 2017-07-28 NOTE — Op Note (Signed)
FULL OPERATIVE NOTE    Date Time: 07/28/17 6:02 PM  Patient Name: [name]  Attending Physician: Fransico Setters, MD      Date of Operation:   07/28/2017    Providers Performing:   Surgeon(s):  Fransico Setters, MD    Circulator: Nicki Reaper, RN  Scrub Person: Charlton Haws  Second Circulator: Charolotte Capuchin, RN    Operative Procedure:   Procedure(s):  PHLEBECTOMY BILATERAL LEG    Preoperative Diagnosis:   Pre-Op Diagnosis Codes:     * Venous insufficiency [I87.2]    Postoperative Diagnosis:   Post-Op Diagnosis Codes:     * Venous insufficiency [I87.2]    Indications:   Patient has symptomatic bilateral lower extremity venous insufficiency and varicosities.  Therefore, decision was made to proceed with fibrillar to phlebectomy.  The risks and benefits of the procedures were explained to the patient and agreed. The risk of infection, bleeding, deep vein thrombosis, pulmonary embolism, nerve injury and other complications were explained in detail and agreed.     Patient understands that symptoms may not completely resolve with this procedure.        Operative Notes:   Patient was brought to the preop area in standing position, the varicosities of both lower extremities were marked.  Then patient was placed in supine position.  10-20 stab incisions were made throughout the right lower extremity using 11 blade and using Jacobson's hemostats and mosquitoes.  Large amount of varicosities were removed.  Then manual pressure was applied for a few minutes.  There was no evidence of bleeding on the wounds were closed using Dermabond.  Then we turned our attention to the left lower extremity approximately 10 small stab wound incisions were made using the same technique.  All the wounds were closed in the same fashion.  Dressings were applied.  Both legs were wrapped with Ace bandages.  Patient tolerated the procedure well, was transferred to the recovery room.    Estimated Blood Loss:   * No values  recorded between 07/28/2017  5:18 PM and 07/28/2017  5:59 PM *    Implants:   * No implants in log *    Drains:       Specimens:       Complications:       Signed by: Fransico Setters, MD

## 2017-07-28 NOTE — Anesthesia Preprocedure Evaluation (Signed)
Anesthesia Evaluation    AIRWAY    Mallampati: II    TM distance: >3 FB  Neck ROM: full  Mouth Opening:full   CARDIOVASCULAR    cardiovascular exam normal       DENTAL    no notable dental hx     PULMONARY    pulmonary exam normal     OTHER FINDINGS              Relevant Problems   No relevant active problems       PSS Anesthesia Comments: Previous GA without problems - IVGA discussed, questions answered.  03/15/17 EKG: SR, low voltage, LAD/LAFB, unchanged from 2017.  04/18/17 BMP WNL except glucose 148, H/H 12.0/36.4 (day after TKR).        Anesthesia Plan    ASA 3     general                     intravenous induction   Detailed anesthesia plan: general IV        Post op pain management: per surgeon    informed consent obtained    Plan discussed with CRNA.    ECG reviewed  pertinent labs reviewed             Signed by: Ulyses Jarred 07/28/17 4:36 PM

## 2017-07-28 NOTE — Discharge Instr - AVS First Page (Addendum)
D/C instruction for phlebectomy    Surgeon: Obrien Huskins MD      Remove Ace bandages in 48 hours and may shower.  May resume normal activities in AM.  No running for 7 days.  Call if develop significant swelling of the lower extremity.  Follow up as needed.

## 2017-07-31 ENCOUNTER — Encounter: Payer: Self-pay | Admitting: Specialist

## 2017-08-02 NOTE — Retrospective Coding Query (Signed)
PHYSICIAN'S DOCUMENTATION                                                                      REQUEST                                                                         Date of Request:  08/02/2017  Type of Request:  DOCUMENTATION CLARIFICATION                                         Patient Name: [name], [name]  Account #: [id number]  MR #: [id number]  Discharge Date: 07/28/2017      Dear Dr. Lucianne Muss,    The medical record reflects the following:      Patient has symptomatic bilateral lower extremity venous insufficiency and varicosities.  Therefore, decision was made to proceed with fibrillar to phlebectomy.  Attention to the left lower extremity approximately 10 small stab wound incisions were made using the same technique.      Question to Physician:    To accurately code this account, please specify whether the number of incisions was (1) less than 10, (2) 10-20, or (3) greater than 20.      PHYSICIAN RESPONSE:        10-20          Thank you,  Coder Dinger, Amy M.  Date 08/02/2017  [phone]

## 2017-09-22 ENCOUNTER — Encounter (INDEPENDENT_AMBULATORY_CARE_PROVIDER_SITE_OTHER): Payer: Self-pay | Admitting: Specialist

## 2017-11-07 ENCOUNTER — Encounter (INDEPENDENT_AMBULATORY_CARE_PROVIDER_SITE_OTHER): Payer: Self-pay | Admitting: Specialist

## 2017-12-13 ENCOUNTER — Other Ambulatory Visit (INDEPENDENT_AMBULATORY_CARE_PROVIDER_SITE_OTHER): Payer: Self-pay | Admitting: Family Medicine

## 2018-02-12 ENCOUNTER — Encounter (INDEPENDENT_AMBULATORY_CARE_PROVIDER_SITE_OTHER): Payer: Self-pay | Admitting: Specialist

## 2018-04-03 ENCOUNTER — Encounter (INDEPENDENT_AMBULATORY_CARE_PROVIDER_SITE_OTHER): Payer: Self-pay

## 2018-06-22 ENCOUNTER — Other Ambulatory Visit: Payer: Self-pay | Admitting: Orthopaedic Surgery

## 2018-08-01 ENCOUNTER — Other Ambulatory Visit: Payer: Self-pay | Admitting: Family Medicine

## 2018-08-16 ENCOUNTER — Other Ambulatory Visit: Payer: Self-pay | Admitting: Neurology

## 2018-08-16 ENCOUNTER — Ambulatory Visit
Admission: RE | Admit: 2018-08-16 | Discharge: 2018-08-16 | Disposition: A | Discharge status: Home / Self Care | Payer: Medicare (Managed Care) | Source: Ambulatory Visit | Attending: Neurology | Admitting: Neurology

## 2018-08-16 DIAGNOSIS — M5416 Radiculopathy, lumbar region: Secondary | ICD-10-CM

## 2018-08-16 DIAGNOSIS — M47897 Other spondylosis, lumbosacral region: Secondary | ICD-10-CM | POA: Insufficient documentation

## 2018-08-16 DIAGNOSIS — M47896 Other spondylosis, lumbar region: Secondary | ICD-10-CM | POA: Insufficient documentation

## 2018-08-16 DIAGNOSIS — M4807 Spinal stenosis, lumbosacral region: Secondary | ICD-10-CM | POA: Insufficient documentation

## 2018-08-16 DIAGNOSIS — M48061 Spinal stenosis, lumbar region without neurogenic claudication: Secondary | ICD-10-CM | POA: Insufficient documentation

## 2018-08-17 ENCOUNTER — Other Ambulatory Visit: Payer: Self-pay | Admitting: Family Medicine

## 2018-10-01 ENCOUNTER — Encounter (INDEPENDENT_AMBULATORY_CARE_PROVIDER_SITE_OTHER): Payer: Self-pay

## 2018-10-09 ENCOUNTER — Other Ambulatory Visit (INDEPENDENT_AMBULATORY_CARE_PROVIDER_SITE_OTHER): Payer: Self-pay | Admitting: Family Medicine

## 2018-11-08 ENCOUNTER — Other Ambulatory Visit (INDEPENDENT_AMBULATORY_CARE_PROVIDER_SITE_OTHER): Payer: Self-pay | Admitting: Family Medicine

## 2018-11-15 ENCOUNTER — Ambulatory Visit: Payer: No Typology Code available for payment source

## 2018-11-15 NOTE — Anesthesia Preprocedure Evaluation (Addendum)
Anesthesia Evaluation    AIRWAY    Mallampati: II    TM distance: >3 FB  Neck ROM: full  Mouth Opening:full   CARDIOVASCULAR    cardiovascular exam normal       DENTAL         PULMONARY    pulmonary exam normal     OTHER FINDINGS    Pt with hx bilateral LE neuropathy.  Last knee pt didn't have block or spinal.  Pt has hx of severe osa on cpap.  Pt discussed with his neurologist peripheral nerve block and spinal.  Neurologist stated okay.  Given risk with sleep apnea, will do adductor canal cath and spinal.            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 Birdie Riddle 11/20/2018- patient had GA and no block with last TKA. Neuropathy lower legs bilaterally.LS         Anesthesia Plan    ASA 3     spinal                     intravenous induction   Detailed anesthesia plan: spinal and PNB            informed consent obtained    Plan discussed with CRNA.                   Signed by: Helaine Chess 11/15/18 1:57 PM

## 2018-11-15 NOTE — Pre-Procedure Instructions (Signed)
No Cardiac or Respiratory events within past 6months.  Orders faxed to pharmacy  Faxed surgeon for Pre-ops when available, PCP appointment was last week.  Hibiclens soap shower instruction given.

## 2018-11-20 ENCOUNTER — Ambulatory Visit: Payer: No Typology Code available for payment source | Attending: Orthopaedic Surgery

## 2018-12-03 ENCOUNTER — Ambulatory Visit: Payer: Medicare (Managed Care)

## 2018-12-03 ENCOUNTER — Ambulatory Visit: Payer: Self-pay

## 2018-12-03 ENCOUNTER — Ambulatory Visit: Payer: Medicare (Managed Care) | Admitting: Acute Care

## 2018-12-03 ENCOUNTER — Ambulatory Visit
Admission: RE | Admit: 2018-12-03 | Discharge: 2018-12-04 | Disposition: A | Discharge status: Home / Self Care | Payer: Medicare (Managed Care) | Source: Ambulatory Visit | Attending: Orthopaedic Surgery | Admitting: Orthopaedic Surgery

## 2018-12-03 ENCOUNTER — Encounter
Admission: RE | Disposition: A | Discharge status: Home / Self Care | Payer: Self-pay | Source: Ambulatory Visit | Attending: Orthopaedic Surgery

## 2018-12-03 DIAGNOSIS — E785 Hyperlipidemia, unspecified: Secondary | ICD-10-CM | POA: Insufficient documentation

## 2018-12-03 DIAGNOSIS — G629 Polyneuropathy, unspecified: Secondary | ICD-10-CM | POA: Insufficient documentation

## 2018-12-03 DIAGNOSIS — I739 Peripheral vascular disease, unspecified: Secondary | ICD-10-CM | POA: Insufficient documentation

## 2018-12-03 DIAGNOSIS — I872 Venous insufficiency (chronic) (peripheral): Secondary | ICD-10-CM | POA: Insufficient documentation

## 2018-12-03 DIAGNOSIS — Z7982 Long term (current) use of aspirin: Secondary | ICD-10-CM | POA: Insufficient documentation

## 2018-12-03 DIAGNOSIS — G4730 Sleep apnea, unspecified: Secondary | ICD-10-CM | POA: Insufficient documentation

## 2018-12-03 DIAGNOSIS — Z96652 Presence of left artificial knee joint: Secondary | ICD-10-CM | POA: Insufficient documentation

## 2018-12-03 DIAGNOSIS — M1711 Unilateral primary osteoarthritis, right knee: Secondary | ICD-10-CM | POA: Insufficient documentation

## 2018-12-03 DIAGNOSIS — G2581 Restless legs syndrome: Secondary | ICD-10-CM | POA: Insufficient documentation

## 2018-12-03 HISTORY — PX: ARTHROPLASTY, KNEE, TOTAL, COMPUTER NAVIGATION: SHX3136

## 2018-12-03 LAB — TYPE AND SCREEN
AB Screen Gel: NEGATIVE
ABO Rh: O POS

## 2018-12-03 SURGERY — ARTHROPLASTY, KNEE, TOTAL, COMPUTER NAVIGATION
Anesthesia: Regional | Site: Knee | Laterality: Right | Wound class: Clean

## 2018-12-03 MED ORDER — LACTATED RINGERS IV SOLN
INTRAVENOUS | Status: DC
Start: 2018-12-03 — End: 2018-12-03

## 2018-12-03 MED ORDER — CEFAZOLIN SODIUM 10 G IJ SOLR
2.0000 g | Freq: Three times a day (TID) | INTRAMUSCULAR | Status: AC
Start: 2018-12-03 — End: 2018-12-04
  Administered 2018-12-03 – 2018-12-04 (×2): 2 g via INTRAVENOUS
  Filled 2018-12-03 (×2): qty 2000

## 2018-12-03 MED ORDER — METOCLOPRAMIDE HCL 5 MG PO TABS
5.0000 mg | ORAL_TABLET | Freq: Four times a day (QID) | ORAL | Status: DC | PRN
Start: 2018-12-03 — End: 2018-12-04

## 2018-12-03 MED ORDER — SODIUM CHLORIDE 0.9 % IR SOLN
Status: DC | PRN
Start: 2018-12-03 — End: 2018-12-03
  Administered 2018-12-03: 1000 mL

## 2018-12-03 MED ORDER — LACTATED RINGERS IV SOLN
50.0000 mL/h | INTRAVENOUS | Status: DC
Start: 2018-12-03 — End: 2018-12-04

## 2018-12-03 MED ORDER — SODIUM CHLORIDE BACTERIOSTATIC 0.9 % IJ SOLN
INTRAMUSCULAR | Status: DC | PRN
Start: 2018-12-03 — End: 2018-12-03
  Administered 2018-12-03: 30 mL

## 2018-12-03 MED ORDER — SODIUM CHLORIDE 0.9 % IV SOLN
INTRAVENOUS | Status: DC
Start: 2018-12-03 — End: 2018-12-03

## 2018-12-03 MED ORDER — CELECOXIB 100 MG PO CAPS
200.0000 mg | ORAL_CAPSULE | Freq: Two times a day (BID) | ORAL | Status: DC
Start: 2018-12-03 — End: 2018-12-04
  Administered 2018-12-03 – 2018-12-04 (×2): 200 mg via ORAL
  Filled 2018-12-03 (×2): qty 2

## 2018-12-03 MED ORDER — OXYCODONE-ACETAMINOPHEN 5-325 MG PO TABS
1.0000 | ORAL_TABLET | Freq: Once | ORAL | Status: AC | PRN
Start: 2018-12-03 — End: 2018-12-03
  Administered 2018-12-03: 1 via ORAL
  Filled 2018-12-03: qty 1

## 2018-12-03 MED ORDER — GABAPENTIN 300 MG PO CAPS
300.0000 mg | ORAL_CAPSULE | Freq: Two times a day (BID) | ORAL | Status: DC
Start: 2018-12-03 — End: 2018-12-04
  Administered 2018-12-03 – 2018-12-04 (×2): 300 mg via ORAL
  Filled 2018-12-03 (×2): qty 1

## 2018-12-03 MED ORDER — BACITRACIN 50000 UNITS IM SOLR
INTRAMUSCULAR | Status: AC
Start: 2018-12-03 — End: ?
  Filled 2018-12-03: qty 50000

## 2018-12-03 MED ORDER — VITAMIN C 500 MG PO TABS
DELAYED_RELEASE_TABLET | Freq: Every morning | ORAL | Status: DC
Start: 2018-12-03 — End: 2018-12-04
  Filled 2018-12-03 (×2): qty 1

## 2018-12-03 MED ORDER — CEFAZOLIN SODIUM-DEXTROSE 2-5 GM/50ML-% IV SOLN
2.0000 g | INTRAVENOUS | Status: AC
Start: 2018-12-03 — End: 2018-12-03
  Administered 2018-12-03: 12:00:00 2 g via INTRAVENOUS

## 2018-12-03 MED ORDER — SENNOSIDES-DOCUSATE SODIUM 8.6-50 MG PO TABS
2.0000 | ORAL_TABLET | Freq: Every day | ORAL | Status: DC
Start: 2018-12-03 — End: 2018-12-04
  Administered 2018-12-03 – 2018-12-04 (×2): 2 via ORAL
  Filled 2018-12-03 (×2): qty 2

## 2018-12-03 MED ORDER — PATIENT SUPPLIED NON FORMULARY
1.0000 | Freq: Every day | Status: DC
Start: 2018-12-03 — End: 2018-12-04

## 2018-12-03 MED ORDER — POLYMYXIN B SULFATE 500000 UNITS IJ SOLR
INTRAMUSCULAR | Status: AC
Start: 2018-12-03 — End: ?
  Filled 2018-12-03: qty 500000

## 2018-12-03 MED ORDER — OXYCODONE HCL 5 MG PO TABS
5.0000 mg | ORAL_TABLET | ORAL | Status: DC | PRN
Start: 2018-12-03 — End: 2018-12-04
  Administered 2018-12-04: 5 mg via ORAL
  Filled 2018-12-03: qty 1

## 2018-12-03 MED ORDER — MEPERIDINE HCL 25 MG/ML IJ SOLN
12.5000 mg | Freq: Once | INTRAMUSCULAR | Status: AC
Start: 2018-12-03 — End: 2018-12-03
  Administered 2018-12-03: 14:00:00 12.5 mg via INTRAVENOUS
  Filled 2018-12-03: qty 1

## 2018-12-03 MED ORDER — ROPIVACAINE 0.2 % ON-Q 550 ML ADJ RATE SINGLE FLOW (OUTSOURCED)
Status: DC
Start: 2018-12-03 — End: 2018-12-04
  Administered 2018-12-03: 550 mL via PERCUTANEOUS
  Filled 2018-12-03: qty 1

## 2018-12-03 MED ORDER — METOCLOPRAMIDE HCL 5 MG/ML IJ SOLN
5.0000 mg | Freq: Four times a day (QID) | INTRAMUSCULAR | Status: DC | PRN
Start: 2018-12-03 — End: 2018-12-04

## 2018-12-03 MED ORDER — ROPIVACAINE HCL 5 MG/ML IJ SOLN
INTRAMUSCULAR | Status: DC | PRN
Start: 2018-12-03 — End: 2018-12-03
  Administered 2018-12-03: 20 mL via PERINEURAL

## 2018-12-03 MED ORDER — DIAZEPAM 5 MG PO TABS
5.0000 mg | ORAL_TABLET | Freq: Once | ORAL | Status: AC
Start: 2018-12-03 — End: 2018-12-03

## 2018-12-03 MED ORDER — ONDANSETRON HCL 4 MG/2ML IJ SOLN
INTRAMUSCULAR | Status: DC | PRN
Start: 2018-12-03 — End: 2018-12-03
  Administered 2018-12-03: 4 mg via INTRAVENOUS

## 2018-12-03 MED ORDER — FENTANYL CITRATE (PF) 50 MCG/ML IJ SOLN (WRAP)
25.0000 ug | INTRAMUSCULAR | Status: AC | PRN
Start: 2018-12-03 — End: 2018-12-03
  Administered 2018-12-03 (×4): 25 ug via INTRAVENOUS
  Filled 2018-12-03: qty 2

## 2018-12-03 MED ORDER — ACETAMINOPHEN 325 MG PO TABS
650.0000 mg | ORAL_TABLET | Freq: Once | ORAL | Status: DC | PRN
Start: 2018-12-03 — End: 2018-12-03

## 2018-12-03 MED ORDER — EPINEPHRINE HCL 1 MG/ML IJ SOLN (WRAP)
Status: AC
Start: 2018-12-03 — End: ?
  Filled 2018-12-03: qty 1

## 2018-12-03 MED ORDER — TRANEXAMIC ACID-NACL 1000-0.9 MG/100ML-% IV SOLN (SIMPLE - CNR)
1000.0000 mg | Freq: Once | INTRAVENOUS | Status: AC
Start: 2018-12-03 — End: 2018-12-03
  Administered 2018-12-03: 14:00:00 1000 mg via INTRAVENOUS

## 2018-12-03 MED ORDER — NALOXONE HCL 0.4 MG/ML IJ SOLN (WRAP)
0.4000 mg | INTRAMUSCULAR | Status: DC | PRN
Start: 2018-12-03 — End: 2018-12-04

## 2018-12-03 MED ORDER — DIPHENHYDRAMINE HCL 50 MG/ML IJ SOLN
INTRAMUSCULAR | Status: DC | PRN
Start: 2018-12-03 — End: 2018-12-03
  Administered 2018-12-03 (×2): 12.5 mg via INTRAVENOUS

## 2018-12-03 MED ORDER — ACETAMINOPHEN 325 MG PO TABS
650.0000 mg | ORAL_TABLET | Freq: Four times a day (QID) | ORAL | Status: DC
Start: 2018-12-03 — End: 2018-12-04
  Administered 2018-12-03 – 2018-12-04 (×3): 650 mg via ORAL
  Filled 2018-12-03 (×3): qty 2

## 2018-12-03 MED ORDER — ONDANSETRON HCL 4 MG/2ML IJ SOLN
4.0000 mg | Freq: Three times a day (TID) | INTRAMUSCULAR | Status: DC | PRN
Start: 2018-12-03 — End: 2018-12-04

## 2018-12-03 MED ORDER — KETOROLAC TROMETHAMINE 30 MG/ML IJ SOLN
INTRAMUSCULAR | Status: AC
Start: 2018-12-03 — End: ?
  Filled 2018-12-03: qty 1

## 2018-12-03 MED ORDER — TRAMADOL HCL 50 MG PO TABS
50.0000 mg | ORAL_TABLET | Freq: Four times a day (QID) | ORAL | Status: DC
Start: 2018-12-03 — End: 2018-12-04
  Administered 2018-12-03 – 2018-12-04 (×3): 50 mg via ORAL
  Filled 2018-12-03 (×3): qty 1

## 2018-12-03 MED ORDER — OXYCODONE HCL 10 MG PO TABS
10.0000 mg | ORAL_TABLET | ORAL | Status: DC | PRN
Start: 2018-12-03 — End: 2018-12-04
  Administered 2018-12-04: 10 mg via ORAL
  Filled 2018-12-03: qty 1

## 2018-12-03 MED ORDER — TRANEXAMIC ACID-NACL 1000-0.9 MG/100ML-% IV SOLN (SIMPLE - CNR)
1000.0000 mg | Freq: Once | INTRAVENOUS | Status: AC
Start: 2018-12-03 — End: 2018-12-03
  Administered 2018-12-03: 12:00:00 1000 mg via INTRAVENOUS

## 2018-12-03 MED ORDER — ACETAMINOPHEN 500 MG PO TABS
1000.0000 mg | ORAL_TABLET | Freq: Once | ORAL | Status: AC
Start: 2018-12-03 — End: 2018-12-03
  Administered 2018-12-03: 11:00:00 1000 mg via ORAL

## 2018-12-03 MED ORDER — CEFAZOLIN SODIUM-DEXTROSE 2-5 GM/50ML-% IV SOLN
INTRAVENOUS | Status: AC
Start: 2018-12-03 — End: ?
  Filled 2018-12-03: qty 50

## 2018-12-03 MED ORDER — MIDAZOLAM HCL 1 MG/ML IJ SOLN (WRAP)
INTRAMUSCULAR | Status: AC
Start: 2018-12-03 — End: ?
  Filled 2018-12-03: qty 2

## 2018-12-03 MED ORDER — ONDANSETRON HCL 4 MG/2ML IJ SOLN
4.0000 mg | Freq: Once | INTRAMUSCULAR | Status: DC | PRN
Start: 2018-12-03 — End: 2018-12-03

## 2018-12-03 MED ORDER — SODIUM CHLORIDE 0.9 % IV SOLN
INTRAVENOUS | Status: DC
Start: 2018-12-03 — End: 2018-12-04

## 2018-12-03 MED ORDER — KETOROLAC TROMETHAMINE 30 MG/ML IJ SOLN
INTRAMUSCULAR | Status: DC | PRN
Start: 2018-12-03 — End: 2018-12-03
  Administered 2018-12-03: 15 mg

## 2018-12-03 MED ORDER — ASPIRIN 81 MG PO TBEC
81.0000 mg | DELAYED_RELEASE_TABLET | Freq: Two times a day (BID) | ORAL | Status: DC
Start: 2018-12-04 — End: 2018-12-04
  Administered 2018-12-04: 09:00:00 81 mg via ORAL
  Filled 2018-12-03: qty 1

## 2018-12-03 MED ORDER — DIAZEPAM 5 MG PO TABS
ORAL_TABLET | ORAL | Status: AC
Start: 2018-12-03 — End: 2018-12-03
  Administered 2018-12-03: 15:00:00 5 mg via ORAL
  Filled 2018-12-03: qty 1

## 2018-12-03 MED ORDER — CELECOXIB 100 MG PO CAPS
200.0000 mg | ORAL_CAPSULE | Freq: Once | ORAL | Status: AC
Start: 2018-12-03 — End: 2018-12-03
  Administered 2018-12-03: 11:00:00 200 mg via ORAL

## 2018-12-03 MED ORDER — TRANEXAMIC ACID-NACL 1000-0.9 MG/100ML-% IV SOLN (SIMPLE - CNR)
INTRAVENOUS | Status: AC
Start: 2018-12-03 — End: ?
  Filled 2018-12-03: qty 200

## 2018-12-03 MED ORDER — ROPIVACAINE HCL 5 MG/ML IJ SOLN
INTRAMUSCULAR | Status: AC
Start: 2018-12-03 — End: ?
  Filled 2018-12-03: qty 40

## 2018-12-03 MED ORDER — OXYCODONE HCL ER 10 MG PO T12A
10.0000 mg | EXTENDED_RELEASE_TABLET | Freq: Once | ORAL | Status: AC
Start: 2018-12-03 — End: 2018-12-03
  Administered 2018-12-03: 11:00:00 10 mg via ORAL

## 2018-12-03 MED ORDER — LACTATED RINGERS IR SOLN
Status: AC | PRN
Start: 2018-12-03 — End: 2018-12-03
  Administered 2018-12-03: 3000 mL

## 2018-12-03 MED ORDER — GABAPENTIN 300 MG PO CAPS
ORAL_CAPSULE | ORAL | Status: AC
Start: 2018-12-03 — End: ?
  Filled 2018-12-03: qty 1

## 2018-12-03 MED ORDER — CELECOXIB 100 MG PO CAPS
ORAL_CAPSULE | ORAL | Status: AC
Start: 2018-12-03 — End: ?
  Filled 2018-12-03: qty 2

## 2018-12-03 MED ORDER — ROPIVACAINE HCL 5 MG/ML IJ SOLN
INTRAMUSCULAR | Status: DC | PRN
Start: 2018-12-03 — End: 2018-12-03
  Administered 2018-12-03: 30 mL

## 2018-12-03 MED ORDER — EPINEPHRINE HCL 1 MG/ML IJ SOLN (WRAP)
Status: DC | PRN
Start: 2018-12-03 — End: 2018-12-03
  Administered 2018-12-03: .5 mg

## 2018-12-03 MED ORDER — PROPOFOL INFUSION 10 MG/ML
INTRAVENOUS | Status: DC | PRN
Start: 2018-12-03 — End: 2018-12-03
  Administered 2018-12-03: 25 ug/kg/min via INTRAVENOUS

## 2018-12-03 MED ORDER — ACETAMINOPHEN 500 MG PO TABS
ORAL_TABLET | ORAL | Status: AC
Start: 2018-12-03 — End: ?
  Filled 2018-12-03: qty 2

## 2018-12-03 MED ORDER — DEXAMETHASONE 1 MG PO TABS
2.0000 mg | ORAL_TABLET | Freq: Four times a day (QID) | ORAL | Status: DC
Start: 2018-12-03 — End: 2018-12-04
  Administered 2018-12-03 – 2018-12-04 (×3): 2 mg via ORAL
  Filled 2018-12-03 (×3): qty 2

## 2018-12-03 MED ORDER — GABAPENTIN 300 MG PO CAPS
300.0000 mg | ORAL_CAPSULE | Freq: Once | ORAL | Status: AC
Start: 2018-12-03 — End: 2018-12-03
  Administered 2018-12-03: 11:00:00 300 mg via ORAL

## 2018-12-03 MED ORDER — ROSUVASTATIN CALCIUM 10 MG PO TABS
5.0000 mg | ORAL_TABLET | Freq: Every evening | ORAL | Status: DC
Start: 2018-12-03 — End: 2018-12-04
  Filled 2018-12-03: qty 1

## 2018-12-03 MED ORDER — POLYMYXIN B SULFATE 500000 UNITS IJ SOLR
INTRAMUSCULAR | Status: DC | PRN
Start: 2018-12-03 — End: 2018-12-03
  Administered 2018-12-03: 500000 [IU]

## 2018-12-03 MED ORDER — AMMONIA AROMATIC IN INHA
1.0000 | Freq: Once | RESPIRATORY_TRACT | Status: DC | PRN
Start: 2018-12-03 — End: 2018-12-03

## 2018-12-03 MED ORDER — HYDROXYZINE PAMOATE 25 MG PO CAPS
50.0000 mg | ORAL_CAPSULE | Freq: Two times a day (BID) | ORAL | Status: DC | PRN
Start: 2018-12-03 — End: 2018-12-04

## 2018-12-03 MED ORDER — METOCLOPRAMIDE HCL 5 MG/ML IJ SOLN
10.0000 mg | Freq: Once | INTRAMUSCULAR | Status: AC | PRN
Start: 2018-12-03 — End: 2018-12-03
  Administered 2018-12-03: 14:00:00 10 mg via INTRAVENOUS
  Filled 2018-12-03: qty 2

## 2018-12-03 MED ORDER — VITAMINS/MINERALS PO TABS
1.0000 | ORAL_TABLET | Freq: Every morning | ORAL | Status: DC
Start: 2018-12-03 — End: 2018-12-04
  Administered 2018-12-03 – 2018-12-04 (×2): 1 via ORAL
  Filled 2018-12-03 (×2): qty 1

## 2018-12-03 MED ORDER — MIDAZOLAM HCL 1 MG/ML IJ SOLN (WRAP)
2.0000 mg | Freq: Once | INTRAMUSCULAR | Status: DC
Start: 2018-12-03 — End: 2018-12-03

## 2018-12-03 MED ORDER — OXYCODONE HCL ER 10 MG PO T12A
EXTENDED_RELEASE_TABLET | ORAL | Status: AC
Start: 2018-12-03 — End: ?
  Filled 2018-12-03: qty 1

## 2018-12-03 MED ORDER — MEPIVACAINE HCL 1.5 % IJ SOLN
INTRAMUSCULAR | Status: DC | PRN
Start: 2018-12-03 — End: 2018-12-03
  Administered 2018-12-03: 3 mL via INTRASPINAL

## 2018-12-03 MED ORDER — BACITRACIN 50000 UNITS IM SOLR
INTRAMUSCULAR | Status: DC | PRN
Start: 2018-12-03 — End: 2018-12-03
  Administered 2018-12-03: 50000 [IU]

## 2018-12-03 MED ORDER — BISACODYL 10 MG RE SUPP
10.0000 mg | Freq: Every day | RECTAL | Status: DC | PRN
Start: 2018-12-03 — End: 2018-12-04

## 2018-12-03 MED ORDER — FAMOTIDINE 10 MG/ML IV SOLN (WRAP)
INTRAVENOUS | Status: DC | PRN
Start: 2018-12-03 — End: 2018-12-03
  Administered 2018-12-03: 20 mg via INTRAVENOUS

## 2018-12-03 MED ORDER — ONDANSETRON 4 MG PO TBDP
4.0000 mg | ORAL_TABLET | Freq: Three times a day (TID) | ORAL | Status: DC | PRN
Start: 2018-12-03 — End: 2018-12-04

## 2018-12-03 SURGICAL SUPPLY — 110 items
ADHESIVE DERMABOND PRINEO 22CM (Skin Closure) ×1
ADHESIVE SKIN CLOSURE DERMABOND PRINEO (Skin Closure) ×1
ADHESIVE SKIN CLOSURE DERMABOND PRINEO LIQUID 2-OCTYL CYANOACRYLATE (Skin Closure) ×1 IMPLANT
APPLCATOR CHLORAPREP 26ML (Prep) ×2 IMPLANT
BANDAGE ELASTIC SN 6IN (Procedure Accessories) ×1
BANDAGE PROCARE COMPRESSION L5 YD X W6 (Procedure Accessories) ×1
BANDAGE PROCARE COMPRESSION L5 YD X W6 IN 2 CLIP FASTENER BREATHABLE (Procedure Accessories) ×1 IMPLANT
BASEPLATE TIBIAL 8 KNEE CEMENT PRIMARY (Base) ×1 IMPLANT
BASEPLATE TIBIAL 8 KNEE CEMENT PRIMARY TRIATHLON (Base) ×1 IMPLANT
BASEPLT TRIATH PRIM TIB SZ8 (Base) ×1 IMPLANT
BATTERY SRG DRVR LF (Other) ×3
BATTERY SURGICAL DRIVER INSTRUMENT NA (Other) ×3 IMPLANT
BIPOLAR SEALER MALLEABLE (Cautery) ×1
BLADE 9X25.4MM (Blade) ×1
BLADE SAW SAGITAL 90X19X1.27MM (Blade) ×1
BLADE SAW STRYKER 7/6/5/4/2000 EHD THK1.27 MM SAGITTAL AGGRESSIVE CUT (Blade) ×1 IMPLANT
BLADE SAW THK1.27 MM OSCILLATE SAGITTAL (Blade) ×2
BLADE SAW THK1.27 MM OSCILLATE SAGITTAL BR2000 L90 MM X W25 MM LARGE (Blade) ×1 IMPLANT
BRACE KNEE TRI-PANEL 19IN (Immobilizer) ×1
CEMENT BONE HIGH VISCOSITY COBALT 40 GM (Cement) ×2 IMPLANT
CEMENT COBALT HV HI CNTRST 40G (Cement) ×2 IMPLANT
COMPONENT FEMORAL 8 KNEE RIGHT CRUCIATE RETAIN CEMENT TRIATHLON (Component) ×1 IMPLANT
COMPONENT FEMORAL 8 KNEE RT CRUCIATE (Component) ×1 IMPLANT
COMPONENT FEMRL CRUC TRI SZ 8 (Component) ×1 IMPLANT
COMPONENT PATELLAR H11 MM OD38 MM (Patella) ×1 IMPLANT
COMPONENT PATELLAR H11 MM OD38 MM ASYMMETRIC TRIATHLON X3 KNEE (Patella) ×1 IMPLANT
COVER CAM LF STRL LEN DISP CLR (Procedure Accessories) ×1
COVER CAMERA DISP STERILE (Procedure Accessories) ×1
COVER CAMERA LENS DISPOSABLE CLEAR STERILE LATEX FREE (Procedure Accessories) ×1 IMPLANT
DRAPE LRG FILM W/ U SPLIT (Drape) ×1
DRAPE SPAC STATN SURG 30X72IN (Drape) ×1
DRAPE SURG 3/4 53X77IN (Drape) ×3
DRAPE SURGICAL IMPERVIOUS LARGE FILM L84 (Drape) ×1
DRAPE SURGICAL IMPERVIOUS LARGE FILM L84 IN X W60 IN SMS U (Drape) ×1 IMPLANT
DRAPE SURGICAL SHEET L77 IN X W53 IN (Drape) ×3
DRAPE SURGICAL SHEET L77 IN X W53 IN MEDLINE SMS 3/4 BLUE (Drape) ×3 IMPLANT
DRAPE TABLE 1 PIECE COVER BARRIER (Drape) ×1
DRAPE TABLE 1 PIECE COVER BARRIER PLASTIC PEDIGO PRODUCTS, INC. CLEAR (Drape) ×1 IMPLANT
GLOVE SURG BIOGEL LTX PF SZ7.5 (Glove) ×3
GLOVE SURG INDCTR STRL SZ 8 RT (Glove) ×1
GLOVE SURG SZ 8.5 (Glove) ×6 IMPLANT
GLOVE SURGICAL 7 1/2 BIOGEL SURGEONS (Glove) ×3
GLOVE SURGICAL 7 1/2 BIOGEL SURGEONS POWDER FREE BEAD CUFF TEXTURE (Glove) ×3 IMPLANT
GLOVE SURGICAL 8 1/2 BIOGEL PI INDICATOR (Glove) ×1
GLOVE SURGICAL 8 1/2 BIOGEL PI INDICATOR UNDERGLOVE POWDER FREE SMOOTH (Glove) ×1 IMPLANT
GLOVE SURGICAL 8 BIOGEL PI INDICATOR (Glove) ×1
GLOVE SURGICAL 8 BIOGEL PI INDICATOR UNDERGLOVE POWDER FREE SMOOTH (Glove) ×1 IMPLANT
HANDLE LIGHT ADAPTIVE LIGHT CONTROL PLUS (Other) ×1
HANDLE LIGHT ADAPTIVE LIGHT CONTROL PLUS TECHNOLOGY SNAP ON LENS TOUCH (Other) ×1 IMPLANT
HANDLE TRUMPF LIGHT  DISP (Other) ×1
HANDPIECE INTERPLUSE HIGH FLOW (Cautery) ×1
HOOD FLYTE PEELAWAY (Procedure Accessories) ×1
HOOD SURGEON ZIPPER PEEL AWAY LENS FLYTE (Procedure Accessories) ×1 IMPLANT
IMMOBILIZER KNEE UNIVERSAL L19 IN CANVAS (Immobilizer) ×1
IMMOBILIZER KNEE UNIVERSAL L19 IN CANVAS ORTHOPEDIC DEROYAL OD12-24 IN (Immobilizer) ×1 IMPLANT
INSERT TIBIAL 8 TRIATHLON H9 MM KNEE (Insert) ×1 IMPLANT
INSERT TIBIAL 8 TRIATHLON H9 MM KNEE BEARING CRUCIATE RETAIN X3 (Insert) ×1 IMPLANT
INSERT TRIATH TIB SZ8 9MM (Insert) ×1 IMPLANT
INSTRUMENT BATTERY EA=1 (Other) ×3
KIT INFECTION CONTROL CUSTOM (Kits) ×2
KIT INFECTION CONTROL CUSTOM IFOH03 (Kits) ×1 IMPLANT
MRKR SKN W RULER AND LABELS (Positioning Supplies) ×1
NEEDLE SPINAL BD OD18 GA L3 1/2 IN (Needles) ×1
NEEDLE SPINAL DISP 18GX3.5IN (Needles) ×1
NEEDLE SPINAL L3 1/2 IN REGULAR WALL QUINCKE TIP OD18 GA BD (Needles) ×1 IMPLANT
PACK SURGICAL BASIN SET FOAK (Other) ×1
PACK SURGICAL BASIN SET FOAK MEDLINE INDUSTRIES, INC. (Other) ×1 IMPLANT
PATELLA TRIATH ASYM 38X11MM (Patella) ×1 IMPLANT
PEN SURGICAL MARKING MEDLINE SKIN RULER (Positioning Supplies) ×1
PEN SURGICAL MARKING SKIN RULER LARGE RESERVOIR REGULAR TIP LABEL (Positioning Supplies) ×1 IMPLANT
PIN FIXATION OD3.18 MM PRELOAD L90 MM (Procedure Accessories) ×2
PIN FIXATION OD3.18 MM PRELOAD L90 MM PINABALL (Procedure Accessories) ×2 IMPLANT
PIN PRLDD 3.2X90MM S EA=1 (Procedure Accessories) ×2
REVOLUTION CMS W/BREAKAWAY (Other) ×1
RINGER LAC IRRIG ARHTRO (Irrigation Solutions) ×1
SET BASIN SINGLE DISP (Other) ×1
SET HANDPIECE SUCTION TUBE HIGH FLOW TIP (Cautery) ×1
SET HANDPIECE SUCTION TUBE HIGH FLOW TIP INTERPULSE (Cautery) ×1 IMPLANT
SLN ALC ISOPROPYL 70 4OZ (Scrub Supplies) ×1
SOLN IRRISEPT WOUND DEBRIDEMNT (Solution) ×1
SOLUTION ANTISEPTIC RUB BOTTLE MEDLINE (Scrub Supplies) ×1 IMPLANT
SOLUTION IRRIGATION LACTATED RINGERS (Irrigation Solutions) ×1
SOLUTION IRRIGATION LACTATED RINGERS 3000 ML PLASTIC CONTAINER (Irrigation Solutions) ×1 IMPLANT
SPONGE GAUZE L4 IN X W4 IN 12 PLY (Sponge) ×2 IMPLANT
SPONGE GAUZE STR 10'S 12PLY4X4 (Sponge) ×2
SUTURE COATED VICRYL 1 CT-1 L27 IN BRAID (Suture) ×3
SUTURE COATED VICRYL 1 CT-1 L27 IN BRAID COATED UNDYED ABSORBABLE (Suture) ×3 IMPLANT
SUTURE COATED VICRYL 2-0 CT-1 L27 IN (Suture) ×2
SUTURE COATED VICRYL 2-0 CT-1 L27 IN BRAID COATED UNDYED ABSORBABLE (Suture) ×2 IMPLANT
SUTURE MONOCRYL 4-0 PS-2 L18 IN (Suture) ×1
SUTURE MONOCRYL 4-0 PS-2 L18 IN MONOFILAMENT UNDYED ABSORBABLE (Suture) ×1 IMPLANT
SUTURE MONOCRYL 4-0 PS2 18 IN (Suture) ×1
SUTURE VICRYL 1 CT1 27IN (Suture) ×3
SUTURE VICRYL 2.0 CT1 27IN (Suture) ×2
SYRINGE 50 ML GRADUATE NONPYROGENIC DEHP (Syringes, Needles) ×1
SYRINGE 50 ML GRADUATE NONPYROGENIC DEHP FREE PVC FREE LOK MEDICAL (Syringes, Needles) ×1 IMPLANT
SYRINGE LUER LOK 50ML (Syringes, Needles) ×1
SYSTEM BONE CEMENT MIX BREAKAWAY NOZZLE (Other) ×1
SYSTEM BONE CEMENT MIX BREAKAWAY NOZZLE REVOLUTION (Other) ×1 IMPLANT
SYSTEM WOUND IRRIGATION DEBRIDEMENT (Solution) ×1
SYSTEM WOUND IRRIGATION DEBRIDEMENT CLEANSING IRRISEPT 0.05% (Solution) ×1 IMPLANT
TIP SUCTION SELEC-TROL LARGE OD24 FR (Suction) ×1
TIP SUCTION SELEC-TROL LARGE OD24 FR VINYL (Suction) ×1 IMPLANT
TRAY CATH RBR BARD 15FR LTX SPEC CNTNR (Catheter Miscellaneous) IMPLANT
TRAY TOTAL KNEE FFX (Pack) ×2 IMPLANT
TRAY URETH CATHERIZA (Catheter Micellaneous)
UNDRGLOV SZ8.5 PI INDICATR BLU (Glove) ×1
UNIT ELECTROSURGICAL L5.74 MM 30 D (Cautery) ×1
UNIT ELECTROSURGICAL L5.74 MM 30 D BIPOLAR SEALER LIGHT HEMOSTATIC (Cautery) ×1 IMPLANT
YANKAUER ARGYLE SUCT OPEN TIP (Suction) ×1

## 2018-12-03 NOTE — Interval H&P Note (Signed)
Seen and examined. H and P reviewed. No interval changes.    The patient is a 78 year old male with a history of   right knee osteoarthritis.  Pain has gotten progressively worse despite conservative measures. Pain is rated 7/10. Functional limitations include pain with walking and increased activity.  Safety concerns include difficulty with ADLs.    The patient has taken NSAIDs for pain for years.  They have tried exercises in the form of home exercises for years. The patient has failed use of assistive devices including holding the banister on stairs. The patient has also tried injections with no significant long term improvement.    PE:    Right knee    - Alignment neutral  - Trace effusion  - Skin intact  - ROM 0-120  - Tenderness on palpation of the medial and lateral joint line.  - Crepitation with ROM  - NVI distally    - Antalgic gait    X-rays right knee: Joint space narrowing, subchondral sclerosis, osteophyte formation.    Impression: Right knee osteoarthritis    Plan:  The patient has tried extensive conservative management and continues to have progressively worsening pain.  The have elected to proceed with   right total knee arthroplasty with computer navigaiton. Further conservative treatment would not be expected to significantly improve symptoms.    Risks, benefits, and alternatives have been reviewed. The risks include, but are not limited to, bleeding, infection, neurovascular injury, persistent or worsening pain, instability, stiffness, fracture, loosening, wear, loss of limb, DVT, PE, and death. Benefits include potentially improved pain and function. Alternatives include rest, activity modification, PT, medication, injections.  The patient has indicated their understanding and agrees to proceed. Informed consent has been obtained and placed on the chart.

## 2018-12-03 NOTE — PACU (Signed)
Called dr. Thomasene Lot 20 minutes ago asking about dilaudid administration. Declined due to patient having severe OSA. Suggested a ropivicaine bolus to which she said shed be right over. Just spoke with anesthesia again to clarify POC and POC was changed to giving 5 mg PO valium instead

## 2018-12-03 NOTE — Progress Notes (Signed)
PROGRESS NOTE    Date Time: 12/03/18 4:33 PM  Patient Name: [name]      Assessment:   Stable status post right total knee arthroplasty    Plan:   Physical therapy for ambulation  DVT prophylaxis: ambulation, foot pumps, aspirin  Pain management    Subjective:   Resting comfortably.  Pain controlled.  Feels a little groggy from Valium that was given in PACU.  Having some muscle spasm.    Physical Exam:     Vitals:    12/03/18 1530   BP: 117/72   Pulse: 76   Resp: 16   Temp: 98.1 F (36.7 C)   SpO2: 98%       Intake and Output Summary (Last 24 hours) at Date Time    Intake/Output Summary (Last 24 hours) at 12/03/2018 1633  Last data filed at 12/03/2018 1431  Gross per 24 hour   Intake 1800 ml   Output 100 ml   Net 1700 ml       Right knee  - Dressing  in place.  No erythema or drainage.  - Calf soft and non-tender.  - Palpable dorsalis pedis and posterior tibial pulse  - Sensation to light touch intact throughout the left foot  - Motor: Intact toe flexion/extension and ankle dorsiflexion/plantarflexion      Labs:     Results     Procedure Component Value Units Date/Time    Type and Screen [[id number]] Collected:  12/03/18 1059    Specimen:  Blood Updated:  12/03/18 1145     ABO Rh O POS     AB Screen Gel NEG              Signed by: Cathey Endow

## 2018-12-03 NOTE — Anesthesia Procedure Notes (Addendum)
Peripheral    Patient location during procedure: Pre-Op  Reason for block: Post-op pain managment  Injection technique: Catheter  Block Region: Adductor canal/Mid-thigh femoral  Laterality: Right  Block at surgeon's request Yes  Start time: 12/03/2018 11:34 AM  End time: 12/03/2018 11:44 AM    Staffing  Anesthesiologist: Sharlet Salina, MD  Performed: Anesthesiologist     Pre-procedure Checklist   Completed: patient identified, surgical consent, pre-op evaluation, timeout performed, risks and benefits discussed, anesthesia consent given and correct site  Timeout Completed:  12/03/2018 11:34 AM    Peripheral Block  Patient monitoring: Pulse oximetry, EKG, NIBP and Nasal cannula O2  Patient position: Supine  Premedication: Meaningful contact maintained and Yes  Local infiltration: Lidocaine 1%    Needle  Needle type: Tuohy   Needle gauge: 18 G  Needle length: 4 in  Catheter size: 20 G    Procedures: ultrasound guided  Ultrasound Guided: LA spread visualized, Needle visualized, Relevant anatomy identified (nerve, vessels, muscle), Image stored or printed and Catheter visualized      Assessment   Incremental injection: yes  Injection made incrementally with aspirations every 5 mL.  Injection Resistance: no  Paresthesia Pain: No    Blood Aspirated: No  no suspected intravascular injection  Patient tolerated procedure well: Yes  Block Outcome: No complications

## 2018-12-03 NOTE — Addendum Note (Signed)
Addendum  created 12/03/18 1617 by Sharlet Salina, MD    Clinical Note Signed

## 2018-12-03 NOTE — Plan of Care (Addendum)
Problem: Knee Surgery  Goal: Stable Neurovascular Status  Flowsheets (Taken 12/03/2018 1855)  Stable neurovascular status : Assess and document plantar/dorsiflexion every 4 hours; Monitor/assess neurovascular status (pulses, capillary refill, pain, paresthesia, presence of edema); Monitor/assess for return of sensation after nerve block therapy if indicated; VTE prevention: administer anticoagulant(s) and/or apply anti-embolism stockings/devices as ordered    Comments: POD0 R TKA w Dr. Anise Salvo. A/OX4, VSS, 3L NC. R knee w DB, mepilex, ace wrap. Ice therapy to knee. Neurovascularly intact, denies numbness or tingling. When pt arrived to unit, R leg w spasms however now it has resolved. CNB @ 8 mg/hr to R adductor canal/ mid thigh femoral. Ambulating and voiding well. OOB w walker.

## 2018-12-03 NOTE — PT Eval Note (Signed)
[facility]  Physical Therapy Evaluation    Patient: [name] MRN: [id number]   Unit: 5NEW ORTHOPEDICS    Bed: V409/W119-14  Recommendation  Discharge Recommendation: Home with outpatient PT  DME Recommended for Discharge: (none)  PT - Next Visit Recommendation: 12/04/18  PT Frequency: 4-5x/wk    Recommended mode of transportation at discharge car  PMP - Progressive Mobility Protocol   PMP Activity: Step 7 - Walks out of Room  Distance Walked (ft) (Step 6,7): 120 Feet      Evaluation:   Consult received for [name] for PT evaluation and treatment.  Chart reviewed.  Patient's medical condition is appropriate for Physical Therapy intervention at this time.     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 male admitted on 12/03/2018 with R TKR cont n block      Patient Active Problem List   Diagnosis    Dizziness and giddiness    Gross hematuria    Tear of medial meniscus of left knee    Venous insufficiency of both lower extremities    Localized osteoarthritis of left knee    Osteoarthritis of right knee     Past Medical History:   Diagnosis Date    Abnormal vision     glasses    Arthritis     Disorder of prostate     Ear, nose and throat disorder 2016    rhinitis (occasionally severe)    Fracture 2013    left fibula    Hyperlipidemia     controlled with medication    Neuropathy     neuralgia in  both legs    Peripheral vascular disease 2016, 2017, 2018    Restless leg syndrome     Sciatica     Sleep apnea     documented w/ CPAP    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    Venous insufficiency of both lower extremities 12/20/2016     Past Surgical History:   Procedure Laterality Date    ANKLE FRACTURE SURGERY Left 2013    ARTHROPLASTY, KNEE, TOTAL, COMPUTER  NAVIGATION Left 04/17/2017    Procedure: ARTHROPLASTY, KNEE, TOTAL, COMPUTER NAVIGATION;  Surgeon: Cathey Endow, MD;  Location: [place] [facility];  Service: Orthopedics;  Laterality: Left;  LEFT TOTAL KNEE ARTHROPLASTY WITH NAVIGATION    ARTHROSCOPY, KNEE Left 03/22/2016    Procedure: ARTHROSCOPY, KNEE;  Surgeon: [name], MD;  Location: [place] [facility];  Service: Orthopedics;  Laterality: Left;  LEFT KNEE PARTIAL MEDIAL AND LATERAL MENISCECTOMY    COLONOSCOPY  2016    COSMETIC SURGERY  unknown    eye lift    CYSTOSCOPY, TURP N/A 04/23/2015    Procedure: CYSTOSCOPY, TURP, BLADDER BIOPSY;  Surgeon: Jobe Gibbon, MD;  Location: [facility];  Service: Urology;  Laterality: N/A;  CYSTOSCOPY, CLOT EVACUATION,  TURP, BLADDER BIOPSY w/FULGERATION    HERNIA REPAIR  2008    KNEE ARTHROSCOPY W/ MENISCAL REPAIR Left 2012    PHLEBECTOMY Bilateral 07/28/2017    Procedure: PHLEBECTOMY BILATERAL LEG;  Surgeon: Fransico Setters, MD;  Location:  MAIN OR;  Service: Cardiovascular;  Laterality: Bilateral;    SKIN BIOPSY  2017       Previous Functional Level/Home Living Situation  Prior Level of Function  Prior level of function: Independent with ADLs;Ambulates with assistive device(cane intermittently)  Baseline Activity Level: Community ambulation  Driving: does not drive  Employment: Retired  DME Currently at Home: ADL- Environmental health practitioner, [employer];Environmental consultant, [employer]      Home Living Arrangements  Type of Home: House  Home Layout: One level  Bathroom Shower/Tub: Walk-in Physiological scientist: Raised  Bathroom Equipment: Grab bars in shower  DME Currently at Home: ADL- Grab Bars;Cane, [place];Walker, Front Wheel        Subjective: Patient is agreeable to participation in the therapy session. Nursing clears patient for therapy.   Pain Assessment  Pain Assessment: Numeric Scale (0-10)  Pain Score: 4-moderate pain  Pain Location: Knee  Pain Intervention(s): Cold applied;Repositioned(reviewed use  of pain meds)             Objective:  Patient is in bed with dressings in place.    Observation of patient/vitals     Inspection/Posture  Inspection/Posture: (restless leg syndorme, pt moving LEs on bed)  Cognition/Neuro Status  Arousal/Alertness: Appropriate responses to stimuli  Attention Span: Appears intact  Orientation Level: Oriented X4    Musculoskeletal Examination  Gross ROM  Right Upper Extremity ROM: within functional limits  Left Upper Extremity ROM: within functional limits  Right Lower Extremity ROM: within functional limits(x knee flex ~0-100)  Left Lower Extremity ROM: within functional limits(sp TKR 2018 full AROM )     Gross Strength  Right Upper Extremity Strength: within functional limits  Left Upper Extremity Strength: within functional limits  Right Lower Extremity Strength: within functional limits(ind SLR  )  Left Lower Extremity Strength: within functional limits      Sensation: peripheral neuropathy B lower legs mostly feet      Functional Mobility  Supine to Sit: Independent  Sit to Stand: Risk analyst  Stand to Sit: Contact Guard Assist      Locomotion  Ambulation: Contact Guard Assist;with front-wheeled walker(120 feet)  Pattern: Wide BOS;decreased cadence(mild steppage pattern)      Participation and Endurance  Participation Effort: good  Endurance: (no co lightheadedness c OOB)       Assessment:    Assessment: Decreased LE strength;Decreased LE ROM;Decreased endurance/activity tolerance;Decreased functional mobility;Gait impairment      Patient History: (Low = none; Mod = 1-2; High = 3 or more)   Personal factors and pre-existing co-morbidities affecting plan of care include  Peripheral neuropathy,restless leg syndrome    Examination: (Low = 1-2; Mod = 3 or more; High = 4 or more)   Examination reveals impairments in the follow body systems: pain, range of motion and gait as demonstrated by these objective measures: pain scale, goniometry and gait analysis    Clinical  presentation is stable     Prognosis: Good;With continued PT status post acute discharge        Plan:       Patient Goal: (home tomorrow)     Risks/Benefits/POC Discussed with Pt/Family: With patient/family       Treatment/Interventions: Exercise;Gait training;Functional transfer training         Goals  Goal Formulation: With patient/family  Time for Goal Acheivement: 3 visits  Goals: Select goal  Pt Will Transfer Bed/Chair: with rolling walker;independent  Pt Will Ambulate: 101-150 feet;with rolling walker;independent  Pt Will Perform Home Exer Program: independent         Interdisciplinary Communication:   Pt left up in chair with alarm activated.  Updated  white communication board in room with patient's current mobility status.Communicated with  RN re tx    Education:   Educated patient and wife to role of physical therapy, plan of care, transfer training techniques, falls prevention, gait training techniques with RW, home safety, next appropriate level of care, exercises for lower extremity including TKR protocol      Patient   demonstrated good understanding.      Discussed goals of therapy with patient , in agreement with plan.      Time Calculation  PT Received On: 12/03/18  Start Time: 1630  Stop Time: 1655  Time Calculation (min): 25 min      Tiburcio Pea, PT

## 2018-12-03 NOTE — Anesthesia Procedure Notes (Signed)
Spinal      Patient location during procedure: OR  Reason for block: Primary Anesthesia In the OR    Block at Surgeon's request: Yes      Start time: 12/03/2018 12:00 PM      Staffing  Anesthesiologist: Sharlet Salina, MD  Performed: anesthesiologist       Pre-procedure Checklist   Completed: patient identified, surgical consent, pre-op evaluation, timeout performed, risks and benefits discussed, monitors and equipment checked, anesthesia consent given and correct site  Timeout Completed:  12/03/2018 12:00 PM    Spinal  Patient monitoring: pulse oximetry, EKG, NIBP and nasal cannula O2    Premedication: Meaningful Contact Maintained and Yes    Patient position: sitting    Sterile Technique: chlorhexidine gluconate and isopropyl alcohol, mask used and wearing gloves  Skin Local: lidocaine 1%        Approach: midline  Number of attempts: 1      Needle Placement    Needle gauge: 25      Intrathecal space entered at L3-4          Catheter Placement   CSF Return: Yes                Assessment   Block Outcome: patient tolerated procedure well, no complications and successful block

## 2018-12-03 NOTE — Anesthesia Postprocedure Evaluation (Signed)
Anesthesia Post Evaluation    Patient: Melven Stockard Gabbard    Procedure(s) with comments:  ARTHROPLASTY, KNEE, TOTAL, COMPUTER NAVIGATION - RIGHT ARTHROPLASTY, KNEE, TOTAL, W/ NAVIGATION      Anesthesia type: spinal    Last Vitals:   Vitals Value Taken Time   BP 130/76 12/03/2018  3:10 PM   Temp 36.7 C (98 F) 12/03/2018  2:30 PM   Pulse 108 12/03/2018  3:10 PM   Resp 18 12/03/2018  3:10 PM   SpO2 98 % 12/03/2018  3:10 PM                 Anesthesia Post Evaluation    Signed by: Sharlet Salina, 12/03/2018 3:44 PM     Anesthesia Post Evaluation    Patient: Seth Bake Luhrs    Procedures performed: Procedure(s) with comments:  ARTHROPLASTY, KNEE, TOTAL, COMPUTER NAVIGATION - RIGHT ARTHROPLASTY, KNEE, TOTAL, W/ NAVIGATION      Anesthesia type: Spinal    Patient location: PACU    Post pain: Patient not complaining of pain, continue current therapy     Post assessment: no apparent anesthetic complications    Last vitals:   Vitals:    12/03/18 1530   BP: 117/72   Pulse: 76   Resp: 16   Temp: 36.7 C (98.1 F)   SpO2: 98%       Post vital signs: stable    Level of consciousness: awake    Respiratory: tolerating room air    Cardiovascular: stable    Nausea/Vomiting: patient not complaining of nausea or vomiting    Hydration Status: adequate    Post assessment: no apparent anesthetic complications      Other complications: none    Sharlet Salina, MD  12/03/2018

## 2018-12-03 NOTE — Brief Op Note (Signed)
BRIEF OP NOTE    Date Time: 12/03/18 1:24 PM    Patient Name:   [name] [facility]    Date of Operation:   12/03/2018    Providers Performing:   Surgeon(s):  Cathey Endow, MD  Fitzmorris, Alycia Rossetti, [place]    Assistant (s):   Circulator: Imagene Riches, RN  Scrub Person: Alger Simons    Operative Procedure:   Procedure(s):  ARTHROPLASTY, KNEE, TOTAL, COMPUTER NAVIGATION    Preoperative Diagnosis:   Pre-Op Diagnosis Codes:     * Localized osteoarthritis of right knee [M17.11]    Postoperative Diagnosis:   Post-Op Diagnosis Codes:     * Localized osteoarthritis of right knee [M17.11]    Anesthesia:   Spinal   Adductor canal block    Estimated Blood Loss:   50 mL    Implants:     Implant Name Type Inv. Item Serial No. Manufacturer Lot No. LRB No. Used Action   CEMENT [place] HV HI CNTRST 40G - [id number] Cement CEMENT COBALT HV HI CNTRST 40G  [employer] [id number] Right 2 Implanted   COMPONENT FEMRL CRUC TRI SZ 8 - [id number] Component COMPONENT FEMRL CRUC TRI SZ 8  [employer] [id number] Right 1 Implanted   BASEPLT TRIATH PRIM TIB SZ8 - [id number] Base BASEPLT TRIATH PRIM TIB SZ8  [employer] [id number] Right 1 Implanted   PATELLA TRIATH ASYM 38X11MM - [id number] Patella PATELLA TRIATH ASYM 38X11MM  [employer] [id number] Right 1 Implanted   TRIATHLON X3 TIBIAL BEARING INSERT - CR Implant   [employer] [id number] Right 1 Implanted       Drains:   None    Specimens:   None    Findings:   Osteoarthritis    Complications:   None      Signed by: Cathey Endow, MD                                                                           [place] [facility]

## 2018-12-03 NOTE — Transfer of Care (Signed)
Anesthesia Transfer of Care Note    Patient: [name] [facility]    Procedures performed: Procedure(s) with comments:  ARTHROPLASTY, KNEE, TOTAL, COMPUTER NAVIGATION - RIGHT ARTHROPLASTY, KNEE, TOTAL, W/ NAVIGATION      Anesthesia type: Spinal    Patient location:Phase I PACU    Last vitals:   Vitals:    12/03/18 1145   BP:    Pulse: 65   Resp:    Temp:    SpO2: 99%       Post pain: Patient not complaining of pain, continue current therapy      Mental Status:awake    Respiratory Function: tolerating room air    Cardiovascular: stable    Nausea/Vomiting: patient not complaining of nausea or vomiting    Hydration Status: adequate    Post assessment: no apparent anesthetic complications    Signed by: Feliz Beam  12/03/18 1:47 PM

## 2018-12-03 NOTE — Consults (Signed)
[facility]  MEDICINE CONSULT NOTE      Patient: [name]  Date: 12/03/2018   DOB: [birth year]  Admission Date: 12/03/2018   MRN: [id number]  Attending: Cathey Endow, MD       Reason for Consult: medical management   Requesting Physician: Cathey Endow, MD  Consulting Physician:Jenaveve Fenstermaker Leonides Sake, MD  History Gathered From: Self and Wife    No chief complaint on file.     HISTORY AND PHYSICAL     [name] is a 78 y.o. male with a PMHx of arthritis , neuropathy ,restless leg syndrome , sleep apnea on CPAP   who underwent Rt total knee arthroplasty for Rt knee osteoarthritis .Postoperatively he is doing well. Pain controlled. Denies nausea. 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    Venous insufficiency of both lower extremities 12/20/2016       Past Surgical History:   Procedure Laterality Date    ANKLE FRACTURE SURGERY Left 2013    ARTHROPLASTY, KNEE, TOTAL, COMPUTER NAVIGATION Left 04/17/2017    Procedure: ARTHROPLASTY, KNEE, TOTAL, COMPUTER NAVIGATION;  Surgeon: Cathey Endow, MD;  Location: [place] [facility];  Service: Orthopedics;  Laterality: Left;  LEFT TOTAL KNEE ARTHROPLASTY WITH NAVIGATION    ARTHROSCOPY, KNEE Left 03/22/2016    Procedure: ARTHROSCOPY, KNEE;  Surgeon: [name], MD;  Location: [place] [facility];  Service: Orthopedics;  Laterality: Left;  LEFT KNEE PARTIAL MEDIAL AND LATERAL MENISCECTOMY    COLONOSCOPY  2016    COSMETIC SURGERY  unknown    eye lift    CYSTOSCOPY, TURP N/A  04/23/2015    Procedure: CYSTOSCOPY, TURP, BLADDER BIOPSY;  Surgeon: Jobe Gibbon, MD;  Location: [place] [facility];  Service: Urology;  Laterality: N/A;  CYSTOSCOPY, CLOT EVACUATION,  TURP, BLADDER BIOPSY w/FULGERATION    HERNIA REPAIR  2008    KNEE ARTHROSCOPY W/ MENISCAL REPAIR Left 2012    PHLEBECTOMY Bilateral 07/28/2017    Procedure: PHLEBECTOMY BILATERAL LEG;  Surgeon: Fransico Setters, MD;  Location: [place] [facility];  Service: Cardiovascular;  Laterality: Bilateral;    SKIN BIOPSY  2017       Prior to Admission medications    Medication Sig Start Date End Date Taking? Authorizing Provider   acetaminophen (TYLENOL) 500 MG tablet Take 2 tablets (1,000 mg total) by mouth every 8 (eight) hours. 04/18/17  Yes Cathey Endow, MD   rosuvastatin (CRESTOR) 5 MG tablet TAKE 1 TABLET(S) EVERY DAY BY ORAL ROUTE at bedtime 11/28/16  Yes [provider]   rotigotine (NEUPRO) 6 MG/24HR patch Place 1 patch onto the skin daily.     01/27/16  Yes [provider]   ASPIRIN EC PO TAKE 1 TABLET BY MOUTH TWICE A DAY  WITH FOOD 11/27/18   [provider]   b complex vitamins tablet Take 1 tablet by mouth daily.    [provider]   celecoxib (CELEBREX) 200 MG capsule TAKE 1 CAPSULE BY MOUTH EVERY DAY WITH FOOD 11/27/18   [provider]   [employer] STOOL SOFTENER 100 MG capsule TAKE 1 CAPSULE BY MOUTH TWICE A DAY AS NEEDED FOR CONSTIPATION 11/27/18   [provider]   cyclobenzaprine (FLEXERIL) 10 MG tablet Take 10 mg by mouth 3 (three) times daily as needed    10/10/18   [provider]   Diclofenac Sodium CR 100 MG 24 hr tablet Take by mouth 2 (two) times daily with meals 09/11/18   [provider]   gabapentin (NEURONTIN) 300 MG capsule Take 300 mg by mouth 2 (two) times daily 11/27/18   [provider]   glucosamine-chondroitin 500-400 MG tablet Take 1 tablet by mouth daily    [provider]   Multiple Vitamins-Minerals (MULTIVITAMIN WITH  MINERALS) tablet Take 1 tablet by mouth daily.    [provider]   naproxen sodium (ANAPROX) 220 MG tablet Take 220 mg by mouth as needed    [provider]   vitamin D (CHOLECALCIFEROL) 1000 UNIT tablet Take 1,000 Units by mouth daily.    [provider]   vitamin E 1000 UNIT capsule Take 1,000 Units by mouth daily    [provider]       No Known Allergies    CODE STATUS: full code    PRIMARY CARE MD: Donley Redder, MD    History reviewed. No pertinent family history.    Social History     Tobacco Use    Smoking status: Former Smoker     Packs/day: 0.25     Years: 25.00     Pack years: 6.25     Last attempt to quit: 03/21/1978     Years since quitting: 40.7    Smokeless tobacco: Never Used   Substance Use Topics    Alcohol use: Yes     Comment: Social    Drug use: No       REVIEW OF SYSTEMS     Ten point review of systems negative or as per HPI and below endorsements.    PHYSICAL EXAM     Vital Signs (most recent): BP 117/72    Pulse 76    Temp 98.1 F (36.7 C) (Oral)    Resp 16    Ht 1.854 m (6\' 1" )    Wt 100.9 kg (222 lb 6.4 oz)    SpO2 98%    BMI 29.34 kg/m   Constiutional: No apparent distress.  Patient speaks freely in full sentences.   HEENT: PERRL, no scleral icterus or conjunctival pallor, no nasal discharge, MMM, oropharynx without erythema or exudate  Neck: trachea midline, supple, no cervical or supraclavicular lymphadenopathy or masses  Cardiovascular: RRR, normal S1 S2, no murmurs, no JVD, no carotid bruits. Non-displaced PMI.  Respiratory: Normal rate. No retractions or increased work of breathing. Clear to auscultation and percussion bilaterally.  Gastrointestinal: +BS, non-distended, soft, non-tender, no rebound or guarding, no hepatosplenomegaly  Genitourinary: no suprapubic or costovertebral angle tenderness  Musculoskeletal: Rt knee dressing intact , ice packing on l. No clubbing, edema, or cyanosis. DP and radial pulses 2+ and symmetric. Normal  capillary refill  Skin: no rashes, jaundice or other lesions  Neurologic: EOMI, CN 2-12 grossly intact. no gross motor or sensory deficits  Psychiatric: affect and mood appropriate. The patient is alert, interactive, appropriate.    LABS & IMAGING     Recent Results (from the past 24 hour(s))   Type and Screen    Collection Time: 12/03/18 10:59 AM   Result Value Ref Range    ABO Rh O POS     AB Screen Gel NEG        IMAGING:  XR Knee 1 Or 2 Views Right   Final Result    Normal postoperative findings.      Joshua Pali, MD    12/03/2018 2:09 PM      US GUIDED NERVE BLOCK FOR ANESTHESIA   Final Result          ASSESSMENT & PLAN     [name] is a 78 y.o. male admitted with S/P Rt total knee arthroplasty for Rt knee osteoarthritis    1.Rt total knee arthroplasty for Rt knee osteoarthritis  POD 0   Management per ortho   DVT ppx - on aspirin 81 mg BID   Pain meds - tylenol , tramadol every 6 hrs , gabapentin ,celebrex  On Q pump , oxycodone prn   Continue bowel regimen   PT /OT eval   Encourage incentive spirometry use       2. HLD - continue crestor     3.RLS - on neupro patch   4. Sleep apnea - on CPAP       Thank for for the opportunity to be involved in [name]'s care. We will sign off . Please call if you have any questions       Signed,  Cindee Lame, MD  12/03/2018 3:56 PM

## 2018-12-03 NOTE — Op Note (Signed)
FULL OPERATIVE NOTE    Date Time: 12/03/18 4:34 PM  Patient Name: [name]  Attending Physician: Cathey Endow, MD      Date of Operation:   12/03/2018    Providers Performing:   Surgeon(s):  Cathey Endow, MD  [place], [place], [place]    Circulator: Imagene Riches, RN  Scrub Person: Alger Simons    Operative Procedure:   Right total knee arthroplasty    Preoperative Diagnosis:   Right knee osteoarthritis    Postoperative Diagnosis:   Right knee osteoarthritis    Anesthesia   Spinal    Estimated Blood Loss:   100 mL    Implants:     Implant Name Type Inv. Item Serial No. Manufacturer Lot No. LRB No. Used Action   CEMENT COBALT HV HI CNTRST 40G - [id number] Cement CEMENT COBALT HV HI CNTRST 40G  DJO SURGICAL [id number] Right 2 Implanted   COMPONENT FEMRL CRUC TRI SZ 8 - [id number] Component COMPONENT FEMRL CRUC TRI SZ 8  [employer]YK[employer] [id number] Right 1 Implanted   BASEPLT TRIATH PRIM TIB SZ8 - [id number] Base BASEPLT TRIATH PRIM TIB SZ8  STRYKER [employer] [id number] Right 1 Implanted   PATELLA TRIATH ASYM 38X11MM - [id number] Patella PATELLA TRIATH ASYM 38X11MM  STRYKER ORTHOP[employer] [id number] Right 1 Implanted   TRIATHLON X3 TIBIAL BEARING INSERT - CR Implant   STRYKER [employer] [id number] Right 1 Implanted       Drains:   None    Specimens:   None    Complications:   None      Indications:   The patient is a 78 year old male with longstanding history of pain in the right knee secondary to osteoarthritis.  After prolonged conservative management, the patient was offered a total knee arthroplasty.  The risks and benefits of the procedure were described.  Risks include but are not  limited to, bleeding, infection, neurovascular injury, persistent pain,  stiffness, loosening, wear, fracture, instability, DVT, pulmonary embolism, loss of limb, stroke, heart attack, death, and the risks of anesthesia.  The patient indicated their  understanding and agreed to proceed.    Operative Notes:   The patient was brought  to the preoperative holding area and identified and  identified as Neeb,Giancarlo BERNARD.  The right knee was marked as the appropriate operative site.  Preoperative antibiotics were administered prior to  surgical incision.  An adductor canal block  was placed by anesthesia with no complications. The patient was then brought to the operating room and placed on the operating room table.  Spinal anesthesia was administered by the anesthesiologist with no complications. A tourniquet was applied to the thigh.  The right lower extremity was prepped and draped in the usual standard fashion.  An operative timeout was performed and the operative team unanimously confirmed the patient, procedure and operative site. A midline incision and medial  parapatellar arthrotomy was performed.  The medial capsular sleeve was elevated  to the mid coronal plane.  The patella was subluxed laterally.  The ACL was intact. The PCL was intact.  Inspection of the knee demonstrated advanced osteoarthritis.  The ACL was excised. At this time, the Stryker navigation array was pinned to the distal femur. We then registerred the hip center and distal femur. The distal femoral cutting block was then attached and adjusted to take a 9 mm resection from the high side in 3 degrees of flexion and neutral mechanical axis. The array was then removed. The distal femoral resection was than  made and the cutting block was removed. With the knee flexed,  the femoral sizing guide was applied to the femur and the femur sized to a size 8.  The size 8, 4-in-1 femoral cutting block was pinned parallel to the transepicondylar axis. The finishing cuts were then made.  The Stryker navigation array was then  pinned to the tibial articular surface and the proximal tibia and medial and lateral malleolus were registered. The tibial resection block was placed and adjusted to make a resection perpendicular to the long axis of the tibia in the frontal plane with 3  degrees of posterior slope, taking 9 mm off the high side. The array was then removed and the resection was made. The cutting block was then removed.  With the knee in extension, the extension gap was checked with a spacer block and overall alignment was checked with rods.  The medial and lateral meniscus were excised and osteophytes were removed from the posterior aspect of the femoral condyles.  Flexion and extension gaps were checked to ensure they were well balanced.  The tibia was sized to a size 8. A size 8 tibial base plate was pinned and aligned to the tibial tubercle.  The trial size 8 cruciate retaining femoral component was then placed.  Trial inserts were then placed.  With a 9 mm thick trial insert, there was excellent varus and valgus  stability.  The knee was taken through a range of motion.  There was excellent patellar tracking.  Attention was turned to the patella.   Osteophytes were removed from the patella.  The patella was cut with an  oscillating saw and sized to size 38-mm asymmetric component.  A 38-mm template was placed. Three peg holes were drilled.  A trial 38-mm asymmetric component was then placed.  The knee was reduced and taken through a range of motion.  There  was excellent patellar tracking.  At this time, the trial patellar component, femoral component and insert were removed.  Tibial rotation was set with a keel punch.  Trial tibial component was removed.  The knee was copiously irrigated with pulse lavage irrigation.  Bone surfaces were dried.  The posterior capsule was injected with 30 mL 0.5% ropivicaine, 0.5 mg epinephrine, and 15 mg of Toradol mixed with 30 mL injectable saline. The final components were cemented into place using bone cement (Stryker Triathalon total knee arthroplasty size 8 CR right femoral component, size 8 tibial component, and a size 38 mm asymmetric patellar component.  Excess cement was removed from the periphery of the components.  A trial insert was  placed while the cement cured. After the cement had cured, I again took the knee through a range of motion with a 9-mm thick insert, and there was excellent varus-valgus stability,  range of motion and patellar tracking.  At this time, the trial insert was  Removed. Irrisept was poured in the wound, allowed to sit for approximately a few minutes, and then irrigated and suctioned out.   Copious irrigation was used and the knee was irrigated and suctioned. The final 9-mm thick insert was then placed.  The knee was reduced.  The arthrotomy was closed with #1 Vicryl.  Subcutaneous tissue was closed with 2-0 Vicryl and the skin was closed with 4-0 monocryl subcuticular suture followed by Dermabond.  An Aquacel Ag dressing was then applied. The patient was awoken and transported to the recovery room in stable condition.      POSTOPERATIVE PLAN:  The  patient will be admitted to the orthopedic floor and receive 24 hours  of prophylactic intravenous antibiotics.  DVT prophylaxis will include early mobilization and SCDs, and aspirin will be started on postoperative day #1.              Signed by: Cathey Endow, MD

## 2018-12-03 NOTE — Progress Notes (Signed)
Block procedure completed with sedation. Time out performed with anesthesiologist. Patient tolerated the procedure with no adverse side effects. Procedure discussed prior to start. Peripheral nerve block education initiated, including fall precautions/sensory deficits and pain management.  Patient and responsible adult verbalized understanding.

## 2018-12-04 ENCOUNTER — Encounter: Payer: Self-pay | Admitting: Acute Care

## 2018-12-04 ENCOUNTER — Encounter: Payer: Self-pay | Admitting: Orthopaedic Surgery

## 2018-12-04 DIAGNOSIS — M1711 Unilateral primary osteoarthritis, right knee: Secondary | ICD-10-CM

## 2018-12-04 LAB — BASIC METABOLIC PANEL
Anion Gap: 5 (ref 5.0–15.0)
BUN: 24 mg/dL (ref 9–28)
CO2: 25 mEq/L (ref 22–29)
Calcium: 8.3 mg/dL (ref 7.9–10.2)
Chloride: 108 mEq/L (ref 100–111)
Creatinine: 0.8 mg/dL (ref 0.7–1.3)
Glucose: 131 mg/dL — ABNORMAL HIGH (ref 70–100)
Potassium: 4.7 mEq/L (ref 3.5–5.1)
Sodium: 138 mEq/L (ref 136–145)

## 2018-12-04 LAB — HEMOGLOBIN AND HEMATOCRIT, BLOOD
Hematocrit: 34.6 % — ABNORMAL LOW (ref 37.6–49.6)
Hgb: 11.7 g/dL — ABNORMAL LOW (ref 12.5–17.1)

## 2018-12-04 LAB — GFR: EGFR: >60

## 2018-12-04 MED ORDER — GABAPENTIN 300 MG PO CAPS
300.00 mg | ORAL_CAPSULE | Freq: Two times a day (BID) | ORAL | 0 refills | Status: DC
Start: 2018-12-04 — End: 2019-04-16

## 2018-12-04 MED ORDER — CELECOXIB 200 MG PO CAPS
200.00 mg | ORAL_CAPSULE | Freq: Every day | ORAL | 0 refills | Status: DC
Start: 2018-12-04 — End: 2019-04-16

## 2018-12-04 MED ORDER — SENNOSIDES-DOCUSATE SODIUM 8.6-50 MG PO TABS
2.0000 | ORAL_TABLET | Freq: Two times a day (BID) | ORAL | 0 refills | Status: DC | PRN
Start: 2018-12-04 — End: 2019-04-16

## 2018-12-04 MED ORDER — ASPIRIN EC 81 MG PO TBEC
81.00 mg | DELAYED_RELEASE_TABLET | Freq: Two times a day (BID) | ORAL | 0 refills | Status: DC
Start: 2018-12-04 — End: 2019-04-16

## 2018-12-04 MED ORDER — OXYCODONE HCL 5 MG PO TABS
ORAL_TABLET | ORAL | 0 refills | Status: DC
Start: 2018-12-04 — End: 2019-04-16

## 2018-12-04 NOTE — PT Progress Note (Signed)
Physical Therapy Note    .ptjoint  [facility]  Physical Therapy Treatment    [address]  [place] [address]  [phone]    Patient:  [name] MRN#:  [id number]  Unit:  5NEW ORTHOPEDICS Room/Bed:  A213/Y865-78    Time of treatment: Start Time: 0745 Stop Time: 0820  Time Calculation (min): 35 min  PT Received On: 12/04/18    Treatment #  2    Precautions  Weight Bearing Status: no restrictions    Patient's medical condition is appropriate for Physical Therapy intervention at this time.     Subjective: Patient is agreeable to participation in the therapy session. Walked c nursing x 600 feet last night  Pain 2/10    Objective: No LE edema      Exercise: ankle pumps, quad sets, gluteal sets, hip abduction, heel slides, short-arc quads, straight leg raises,knee flex ROM, and hamstring stretch.  Ind SLR   Knee flexion 10-110  OOB ind  Sit to stand:ind    Ambulation: 150 feet using rolling walker ind good cadence wt shifting to R  Stairs:ind c walker one step c cane and rail x 4 steps ind  Simulated car transfer  Education:  Educated the patient  in precautions, home safety, home exercise, use of ice, car and bathroom transfers. Demonstrated good understanding of all.    Assessment: Patient   instructed in total joint exercise program.  Required verbal and tactile cues for technique.  Patient  physical therapy goals.    PMP - Progressive Mobility Protocol   PMP Activity: Step 7 - Walks out of Room(150 x 2 )     RN notified of session outcome.     Plan:   [place] c outpt PT fu  Tiburcio Pea, PT

## 2018-12-04 NOTE — Final Progress Note (DC Note for stay less than 48 (Signed)
PROGRESS NOTE    Date Time: 12/04/18 8:45 AM  Patient Name: [name], [name]      Assessment:   1 Day Post-Op status post right total knee arthroplasty    Plan:   Physical therapy  DVT prophylaxis: ambulation, foot pumps, ASA  D/C planning  Patient is stable for D/C to home today if cleared by PT and pain remains well controlled.  Follow up with Dr. Anise Salvo within 2 weeks for 1st post op evaluation.  Patient will contact our office sooner for any post op questions or concerns.    Subjective:   Expected pain level well controlled with current PO pain med regimen. Ambulating well with PT. Denies systemic symptoms. Voiding independently.     Physical Exam:     Vitals:    12/04/18 0400   BP: 110/65   Pulse: 80   Resp: 17   Temp: 98.1 F (36.7 C)   SpO2: 95%       Intake and Output Summary (Last 24 hours) at Date Time    Intake/Output Summary (Last 24 hours) at 12/04/2018 0845  Last data filed at 12/04/2018 0732  Gross per 24 hour   Intake 2000 ml   Output 1150 ml   Net 850 ml       Right knee    - Aquacel Ag in place.  No erythema or drainage.  - Calf soft and non-tender.  - Palpable dorsalis pedis and posterior tibial pulse  - Sensation to light touch intact throughout the right foot  - Motor: Intact toe flexion/extension and ankle dorsiflexion/plantarflexion      Labs:     Results     Procedure Component Value Units Date/Time    GFR [[id number]] Collected:  12/04/18 0423     Updated:  12/04/18 0510     EGFR >60.0    Basic Metabolic Panel [[id number]]  (Abnormal) Collected:  12/04/18 0423    Specimen:  Blood Updated:  12/04/18 0510     Glucose 131 mg/dL      BUN 24 mg/dL      Creatinine 0.8 mg/dL      Calcium 8.3 mg/dL      Sodium 322 mEq/L      Potassium 4.7 mEq/L      Chloride 108 mEq/L      CO2 25 mEq/L      Anion Gap 5.0    Hemoglobin and hematocrit, blood [[id number]]  (Abnormal) Collected:  12/04/18 0423    Specimen:  Blood Updated:  12/04/18 0447     Hgb 11.7 g/dL      Hematocrit 37.6 %     Type and Screen  [[id number]] Collected:  12/03/18 1059    Specimen:  Blood Updated:  12/03/18 1145     ABO Rh O POS     AB Screen Gel NEG              Signed by: Alycia Rossetti Caryl Manas

## 2018-12-04 NOTE — OT Eval Note (Signed)
[facility]   Occupational Therapy Evaluation     Patient: [name]    MRN#: [id number]   Unit: 5NEW ORTHOPEDICS  Bed: V409/W119-14    Attention Physicians:  For patients in an observation or outpatient status, Medicare requires the ordering provider to certify that this service is medically necessary.  Please co-sign this evaluation to indicate your agreement.  Thank you.    Time of treatment: Time Calculation  OT Received On: 12/04/18  Start Time: 0850  Stop Time: 0918  Time Calculation (min): 28 min    Consult received for [name] for OT Evaluation and Treatment.  Patients medical condition is appropriate for Occupational therapy intervention at this time.    OT Recommendations  Discharge Recommendation: (home w/ wife)    Assessment:   Pt is near baseline, independent for feeding, grooming, upper body dressing, lower body dressing, and toileting.  Pt is at a supervision level for simulated bathing due to warm/wet environment, recent surgery, and pain medication.  No con't OT needs.   Discharge from therapy.    Interdisciplinary Communication   Patient is in bedside chair and call bell/bed alarm set within reach.  Spoke with RN regarding results of evaluation.    Plan:   Discharge from Occupational Therapy.    Education:   Ed pt on role of OT, lower body dressing techniques, and shower safety.    Evaluation:     Precautions and Contraindications:   Precautions  Weight Bearing Status: no restrictions  Total Knee Replacement: other (comment)(no brace, independent SLR)  Other Precautions: falls    Medical Diagnosis: Localized osteoarthritis of right knee [M17.11]    Rehab Diagnosis: pain in joint, generalized muscle weakness    History of Present Illness: [name] is a 78 y.o. male admitted on 12/03/2018 with   Procedure(s) with comments:  ARTHROPLASTY, KNEE, TOTAL, COMPUTER NAVIGATION - RIGHT ARTHROPLASTY, KNEE, TOTAL, W/ NAVIGATION      1 Day  Post-Op  -------------------     Patient Active Problem List   Diagnosis    Dizziness and giddiness    Gross hematuria    Tear of medial meniscus of left knee    Venous insufficiency of both lower extremities    Localized osteoarthritis of left knee    Osteoarthritis of right knee      Past Medical/Surgical History:  Past Medical History:   Diagnosis Date    Abnormal vision     glasses    Arthritis     Disorder of prostate     Ear, nose and throat disorder 2016    rhinitis (occasionally severe)    Fracture 2013    left fibula    Hyperlipidemia     controlled with medication    Neuropathy     neuralgia in  both legs    Peripheral vascular disease 2016, 2017, 2018    Restless leg syndrome     Sciatica     Sleep apnea     documented w/ CPAP    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    Venous insufficiency of both lower extremities 12/20/2016      Past Surgical History:   Procedure Laterality Date    ANKLE FRACTURE SURGERY Left 2013    ARTHROPLASTY, KNEE, TOTAL, COMPUTER NAVIGATION Left 04/17/2017    Procedure: ARTHROPLASTY, KNEE, TOTAL, COMPUTER NAVIGATION;  Surgeon: Cathey Endow, MD;  Location: [facility];  Service: Orthopedics;  Laterality: Left;  LEFT  TOTAL KNEE ARTHROPLASTY WITH NAVIGATION    ARTHROSCOPY, KNEE Left 03/22/2016    Procedure: ARTHROSCOPY, KNEE;  Surgeon: [name], MD;  Location: [facility];  Service: Orthopedics;  Laterality: Left;  LEFT KNEE PARTIAL MEDIAL AND LATERAL MENISCECTOMY    COLONOSCOPY  2016    COSMETIC SURGERY  unknown    eye lift    CYSTOSCOPY, TURP N/A 04/23/2015    Procedure: CYSTOSCOPY, TURP, BLADDER BIOPSY;  Surgeon: Jobe Gibbon, MD;  Location: [facility];  Service: Urology;  Laterality: N/A;  CYSTOSCOPY, CLOT EVACUATION,  TURP, BLADDER BIOPSY w/FULGERATION    HERNIA REPAIR  2008    KNEE ARTHROSCOPY W/ MENISCAL REPAIR Left 2012    PHLEBECTOMY Bilateral 07/28/2017    Procedure: PHLEBECTOMY BILATERAL LEG;  Surgeon: Fransico Setters, MD;   Location: [facility];  Service: Cardiovascular;  Laterality: Bilateral;    SKIN BIOPSY  2017       Social History/Prior level of function:  Prior Level of Function  Prior level of function: Independent with ADLs;Ambulates with assistive device  Assistive Device: Single point cane  Baseline Activity Level: Community ambulation  Driving: independent  Dressing - Upper Body: independent  Dressing - Lower Body: independent  Feeding: independent  Bathing: independent  Grooming: independent  Toileting: independent  Employment: Retired  DME Currently at Home: ADL- Environmental health practitioner, Single Point;Dan Humphreys, [employer] Living Arrangements  Living Arrangements: Spouse/significant other  Type of Home: House  Home Layout: One level;Able to live on main level with bedroom/bathroom;Performs ADL's on one level;Stairs to enter without rails (add number in comment);Other (Comment)(2 STE no HR)  Bathroom Shower/Tub: Pension scheme manager: Raised  Bathroom Equipment: Grab bars in shower;Hand-held shower  Bathroom Accessibility: Accessible  DME Currently at Home: ADL- Grab Bars;Cane, Single Point;Walker, Front Wheel    Orientation/Cognition: A&O x4    Pain: 2/10    Gross UE ROM: WFL    Gross UE strength: WFL    ADLs: supervision-independent    Functional mobility: mod I w/ FWW    Signature: Marilynne Drivers, [place] 12/04/2018 11:08 AM

## 2018-12-04 NOTE — Plan of Care (Signed)
Pt up walking and pain is at tolerable rate. Will continue to monitor.

## 2018-12-04 NOTE — Plan of Care (Signed)
A&OX4. VSS. Pain is well controlled on current regimen. CNB running @8ml /hr is patent with dressing intact. Mepilex dressing on right knee is C/D/I. Discharge criteria met.    Problem: Knee Surgery  Goal: Hemodynamic Stability  Outcome: Progressing  Goal: Pain at adequate level as identified by patient  Outcome: Progressing  Flowsheets (Taken 12/04/2018 0938)  Pain at adequate level as identified by patient : Identify patient comfort function goal; Evaluate if patient comfort function goal is met; Administer analgesics as prescribed to achieve pain goal  Goal: Stable Neurovascular Status  Outcome: Progressing  Flowsheets (Taken 12/04/2018 0938)  Stable neurovascular status : Assess and document plantar/dorsiflexion every 4 hours; Monitor/assess neurovascular status (pulses, capillary refill, pain, paresthesia, presence of edema); Monitor/assess for return of sensation after nerve block therapy if indicated; VTE prevention: administer anticoagulant(s) and/or apply anti-embolism stockings/devices as ordered  Goal: Free from Infection  Outcome: Progressing  Goal: Mobility/activity is maintained at optimum level for patient  Outcome: Progressing  Flowsheets (Taken 12/04/2018 0938)  Mobility/activity is maintained at optimum level for patient: Administer analgesics as prescribed to achieve pain goal  Goal: Patient will maintain normal GI status  Outcome: Progressing  Goal: Address patient self-management plan  Outcome: Progressing

## 2018-12-04 NOTE — Discharge Instr - AVS First Page (Signed)
Reason for your Hospital Admission:  S/p TKA      Instructions for after your discharge:    Activity  · Start range of motion exrcises at home.  · Don’t drive until your doctor says it’s OK. And never drive while taking opioid pain medication.  · Remember to take pain medications as directed.  · Do not drink alcohol while taking pain medications.  · You can weight bear as tolerated. Gradually wean from walker to a cane under the direction of your physical therapist.  · Slowly bend and straighten your affected knee as far as you can. Do this several times a day.  · When resting, keep your ankle elevated above the level of your heart. This helps keep swelling down.  · Point and flex your foot, and rotate your ankle as much as possible during the first few weeks following surgery. Also, wiggle your toes as much as possible.      Incision care  · You have a waterproof dressing in place.     · You can shower with the waterproof dressing in place.  · Please leave the waterproof dressing in place until seen at your follow-up appointment.waterproof dressing is removed, you can get the incision wet in the shower.  · Use an ice pack or bag of frozen peas--or something similar--wrapped in a thin towel to reduce the swelling. Keep the foot elevated while you ice the knee. Apply the ice pack for 20 minutes; then remove it for 20 minutes. Repeat as needed. Icing helps reduce swelling.    Medication  · Take pain medication as prescribed.  · Take the anticoagulant (blood thinner) medication as prescribed.  · If you need any pain medication refills, please contact the office 48 hours prior to running out of the medication. Refills are not done on weekends by the on call physician.  If you are going to run out of medication over the weekend, please contact the office on Thursday or Friday to obtain refills.    Other precautions  · Arrange your household to keep the items you need within reach.  · Remove throw rugs, electrical  cords, and anything else that may cause you to fall.  · Use nonslip bath mats, grab bars, an elevated toilet seat, and a shower chair in your bathroom.  · Use a cane, crutches, a walker, or handrails until your balance, flexibility, and strength improve, and you can put weight on your leg. And remember to ask for help from others when you need it.  · Free up your hands so that you can use them to keep balance. Use a fanny pack, apron, or pockets to carry things.    Follow-up    · Physical therapy has been ordered. Start physical therapy as soon as possible.  · Follow-up with Dr. Reeves at scheduled appointment 2 weeks post-op.    Notify Dr. Reeves for any increased bleeding, redness, drainage, swelling, worsening pain, calf pain or swelling, or other concerning symptoms.      Report to the emergency room immediately for any chest pain or shortness of breath.    

## 2018-12-04 NOTE — Progress Notes (Signed)
Patient and spouse watched education video on Nerve block catheter removal and verbalized understanding. Discharge instructions reviewed with patient. IV removed with catheter intact.  Pt discharged to home.  Prescription script for Oxycodone was given to patient prior to discharge. Pt's vital signs stable.  Pt is  voiding adequately.  Pt is to follow up with MD.

## 2018-12-04 NOTE — Progress Notes (Signed)
Acute Pain Service   Patient interviewed and denies any anesthesia complications such as nausea, vomiting or sore throat.  + voiding    Post-op Day 1    Date:  12/04/2018 Time:  8:36 AM      Visit Type:  Inpatient Room #   X106/Y694-85    Subjective:  No complaints    Type of block: Adductor Canal Catheter  Surgery: Right TKA    Objective:  Pain Score  0-1/10                    Motor Block  No                    Sensory Block  No                    Catheter site  Clean & dry    Assessment:  Catheter Functioning as expected    Plan:  Continuous Local Anesthetic (LA) Infusion at 8 ml/hr    Start or continue oral pain medications   Yes, Tylenol 650 mg every 4 hours, Gabapentin 300 mg bid, Celebrex 200 mg bid, Tramadol 50 mg every 6 hours and oxycodone 5-10 mg every 3 hours prn.      Would pt receive block again?  Yes

## 2018-12-05 NOTE — Discharge Summary (Signed)
Physician Discharge Summary   Patient ID:  [name]  [id number]  77 y.o.  [birth year]    Admit date: 12/03/2018    Discharge date and time: 12/04/2018 10:03 AM     Admitting Physician: Cathey Endow, MD     Admission Diagnoses: Right knee osteoarthritis    Discharge Diagnoses: Right knee osteoarthritis    Operative Procedures: Right total knee arthroplasty    Indication for Admission: The patient has a history of right knee osteoarthritis that has become progressively worse and the patient has elected to proceed with right total knee arthroplasty.    Hospital Course: The patient was admitted to the hospital on 12/03/2018 and underwent right total knee arthroplasty. Post-operatively, the patient was transferred to the orthopaedic floor.  They received 24 hours of prophylactic intravenous antibiotics.  DVT prophylaxis included AV foot pumps, early ambulation, and aspirin was started on POD #1.      The patient progressed well throughout the hospitalization and was deemed stable for discharge on 12/04/2018 .      Disposition: Home or Self Care    Discharge Medications:    Discharge Medication List as of 12/04/2018  9:38 AM      START taking these medications    Details   oxyCODONE (ROXICODONE) 5 MG immediate release tablet Take 1-2 tablets every 4 hours PRN pain, Print      senna-docusate (PERICOLACE) 8.6-50 MG per tablet Take 2 tablets by mouth 2 (two) times daily as needed for Constipation, Starting Tue 12/04/2018, No Print         CONTINUE these medications which have CHANGED    Details   aspirin EC 81 MG EC tablet Take 1 tablet (81 mg total) by mouth 2 (two) times daily, Starting Tue 12/04/2018, No Print      celecoxib (CELEBREX) 200 MG capsule Take 1 capsule (200 mg total) by mouth daily, Starting Tue 12/04/2018, No Print      gabapentin (NEURONTIN) 300 MG capsule Take 1 capsule (300 mg total) by mouth 2 (two) times daily, Starting Tue 12/04/2018, No Print         CONTINUE these medications which have NOT CHANGED     Details   acetaminophen (TYLENOL) 500 MG tablet Take 2 tablets (1,000 mg total) by mouth every 8 (eight) hours., Starting Tue 04/18/2017, No Print      rosuvastatin (CRESTOR) 5 MG tablet TAKE 1 TABLET(S) EVERY DAY BY ORAL ROUTE at bedtime, Historical Med      rotigotine (NEUPRO) 6 MG/24HR patch Place 1 patch onto the skin daily.    , Starting Wed 01/27/2016, Historical Med      b complex vitamins tablet Take 1 tablet by mouth daily., Historical Med      [employer] STOOL SOFTENER 100 MG capsule TAKE 1 CAPSULE BY MOUTH TWICE A DAY AS NEEDED FOR CONSTIPATION, Historical Med      cyclobenzaprine (FLEXERIL) 10 MG tablet Take 10 mg by mouth 3 (three) times daily as needed   , Starting Wed 10/10/2018, Historical Med      glucosamine-chondroitin 500-400 MG tablet Take 1 tablet by mouth daily, Historical Med      Multiple Vitamins-Minerals (MULTIVITAMIN WITH MINERALS) tablet Take 1 tablet by mouth daily., Historical Med      vitamin D (CHOLECALCIFEROL) 1000 UNIT tablet Take 1,000 Units by mouth daily., Historical Med      vitamin E 1000 UNIT capsule Take 1,000 Units by mouth daily, Historical Med  STOP taking these medications       Diclofenac Sodium CR 100 MG 24 hr tablet        naproxen sodium (ANAPROX) 220 MG tablet              Patient Instructions:     The patient will notify me for any increased bleeding, redness, drainage, swelling, worsening pain, calf pain or swelling, or other concerning symptoms.  They have been instructed to report to the emergency room immediately for any chest pain or shortness of breath.   Physical therapy has been ordered.  Activity:The patient is weight bearing as tolerated on the operative extremity.  Wound Care: The patient has a waterproof dressing in place.  They can shower with the dressing in place.  They will remove the dressing in 1 week. At that time, they can get the incision wet in the shower, and will pat dry afterwards.  Follow-up with Dr. Anise Salvo at scheduled appointment 2 weeks  post-op.    Signed:  Cathey Endow  12/05/2018  7:27 AM

## 2018-12-05 NOTE — Progress Notes (Signed)
Post-op Day 2    Date:  12/05/2018 Time:  12:18 PM      Visit Type:  Outpatient Phone Call:   Left message

## 2018-12-06 NOTE — Progress Notes (Signed)
Post-op Day 3    Date:  12/06/2018 Time:  2:49 PM      Visit Type:  Outpatient Phone Call:   No answer

## 2018-12-11 ENCOUNTER — Other Ambulatory Visit: Payer: Self-pay | Admitting: Orthopaedic Surgery

## 2018-12-11 ENCOUNTER — Ambulatory Visit
Admission: RE | Admit: 2018-12-11 | Discharge: 2018-12-11 | Disposition: A | Discharge status: Home / Self Care | Payer: Medicare (Managed Care) | Source: Ambulatory Visit | Attending: Orthopaedic Surgery | Admitting: Orthopaedic Surgery

## 2018-12-11 DIAGNOSIS — M7989 Other specified soft tissue disorders: Secondary | ICD-10-CM | POA: Insufficient documentation

## 2018-12-11 DIAGNOSIS — Z471 Aftercare following joint replacement surgery: Secondary | ICD-10-CM

## 2018-12-11 DIAGNOSIS — Z96651 Presence of right artificial knee joint: Secondary | ICD-10-CM

## 2018-12-11 DIAGNOSIS — M79604 Pain in right leg: Secondary | ICD-10-CM | POA: Insufficient documentation

## 2019-02-07 ENCOUNTER — Encounter (INDEPENDENT_AMBULATORY_CARE_PROVIDER_SITE_OTHER): Payer: Self-pay

## 2019-02-27 ENCOUNTER — Encounter (INDEPENDENT_AMBULATORY_CARE_PROVIDER_SITE_OTHER): Payer: Self-pay | Admitting: Specialist

## 2019-03-12 ENCOUNTER — Encounter (INDEPENDENT_AMBULATORY_CARE_PROVIDER_SITE_OTHER): Payer: Self-pay | Admitting: Specialist

## 2019-03-20 ENCOUNTER — Other Ambulatory Visit: Payer: Self-pay | Admitting: Neurology

## 2019-03-20 DIAGNOSIS — G959 Disease of spinal cord, unspecified: Secondary | ICD-10-CM

## 2019-03-20 LAB — CBC AND DIFFERENTIAL
Baso(Absolute): 0 10*3/uL (ref 0.0–0.2)
Basos: 0 %
Eos: 1 %
Eosinophils Absolute: 0.1 10*3/uL (ref 0.0–0.4)
Hematocrit: 43.6 % (ref 37.5–51.0)
Hemoglobin: 14.6 g/dL (ref 13.0–17.7)
Immature Granulocytes Absolute: 0 10*3/uL (ref 0.0–0.1)
Immature Granulocytes: 0 %
Lymphocytes Absolute: 1.4 10*3/uL (ref 0.7–3.1)
Lymphocytes: 21 %
MCH: 31 pg (ref 26.6–33.0)
MCHC: 33.5 g/dL (ref 31.5–35.7)
MCV: 93 fL (ref 79–97)
Monocytes Absolute: 0.6 10*3/uL (ref 0.1–0.9)
Monocytes: 9 %
Neutrophils Absolute: 4.5 10*3/uL (ref 1.4–7.0)
Neutrophils: 69 %
Platelets: 216 10*3/uL (ref 150–379)
RBC: 4.71 x10E6/uL (ref 4.14–5.80)
RDW: 13.6 % (ref 12.3–15.4)
WBC: 6.7 10*3/uL (ref 3.4–10.8)

## 2019-03-20 LAB — COMPREHENSIVE METABOLIC PANEL
ALT: 25 IU/L (ref 0–44)
AST (SGOT): 22 IU/L (ref 0–40)
Albumin/Globulin Ratio: 1.8 (ref 1.2–2.2)
Albumin: 4.2 g/dL (ref 3.5–4.8)
Alkaline Phosphatase: 74 IU/L (ref 39–117)
BUN / Creatinine Ratio: 18 (ref 10–24)
BUN: 15 mg/dL (ref 8–27)
Bilirubin, Total: 0.7 mg/dL (ref 0.0–1.2)
CO2: 26 mmol/L (ref 20–29)
Calcium: 9.4 mg/dL (ref 8.6–10.2)
Chloride: 104 mmol/L (ref 96–106)
Creatinine: 0.82 mg/dL (ref 0.76–1.27)
EGFR: 86 mL/min/{1.73_m2} (ref >59)
EGFR: 99 mL/min/{1.73_m2} (ref >59)
Globulin, Total: 2.4 g/dL (ref 1.5–4.5)
Glucose: 102 mg/dL — ABNORMAL HIGH (ref 65–99)
Potassium: 5.2 mmol/L (ref 3.5–5.2)
Protein, Total: 6.6 g/dL (ref 6.0–8.5)
Sodium: 142 mmol/L (ref 134–144)

## 2019-03-20 LAB — LIPID PANEL
Cholesterol / HDL Ratio: 3.6 ratio (ref 0.0–5.0)
Cholesterol: 159 mg/dL (ref 100–199)
HDL: 44 mg/dL (ref >39)
LDL Calculated: 93 mg/dL (ref 0–99)
Triglycerides: 110 mg/dL (ref 0–149)
VLDL Calculated: 22 mg/dL (ref 5–40)

## 2019-03-20 LAB — SEDIMENTATION RATE: Sed Rate: 2 mm/hr (ref 0–30)

## 2019-03-21 ENCOUNTER — Other Ambulatory Visit: Payer: Self-pay | Admitting: Neurology

## 2019-03-21 ENCOUNTER — Ambulatory Visit
Admission: RE | Admit: 2019-03-21 | Discharge: 2019-03-21 | Disposition: A | Discharge status: Home / Self Care | Payer: Medicare (Managed Care) | Source: Ambulatory Visit | Attending: Neurology | Admitting: Neurology

## 2019-03-21 DIAGNOSIS — R269 Unspecified abnormalities of gait and mobility: Secondary | ICD-10-CM

## 2019-03-21 DIAGNOSIS — G959 Disease of spinal cord, unspecified: Secondary | ICD-10-CM

## 2019-03-21 DIAGNOSIS — R2689 Other abnormalities of gait and mobility: Secondary | ICD-10-CM | POA: Insufficient documentation

## 2019-03-21 DIAGNOSIS — G9389 Other specified disorders of brain: Secondary | ICD-10-CM | POA: Insufficient documentation

## 2019-03-21 LAB — WHOLE BLOOD CREATININE WITH GFR POCT
GFR POCT: >60 mL/min/{1.73_m2} (ref >60)
Whole Blood Creatinine POCT: 0.4 mg/dL — ABNORMAL LOW (ref 0.5–1.1)

## 2019-03-25 ENCOUNTER — Ambulatory Visit
Admission: RE | Admit: 2019-03-25 | Discharge: 2019-03-25 | Disposition: A | Discharge status: Home / Self Care | Payer: Medicare (Managed Care) | Source: Ambulatory Visit | Attending: Neurology | Admitting: Neurology

## 2019-03-25 DIAGNOSIS — G959 Disease of spinal cord, unspecified: Secondary | ICD-10-CM | POA: Insufficient documentation

## 2019-03-25 DIAGNOSIS — M4312 Spondylolisthesis, cervical region: Secondary | ICD-10-CM | POA: Insufficient documentation

## 2019-03-25 DIAGNOSIS — M4313 Spondylolisthesis, cervicothoracic region: Secondary | ICD-10-CM | POA: Insufficient documentation

## 2019-03-25 DIAGNOSIS — M47812 Spondylosis without myelopathy or radiculopathy, cervical region: Secondary | ICD-10-CM | POA: Insufficient documentation

## 2019-03-25 DIAGNOSIS — M4802 Spinal stenosis, cervical region: Secondary | ICD-10-CM | POA: Insufficient documentation

## 2019-03-25 MED ORDER — GADOBUTROL 1 MMOL/ML IV SOLN
10.00 mL | Freq: Once | INTRAVENOUS | Status: AC | PRN
Start: 2019-03-25 — End: 2019-03-25
  Administered 2019-03-25: 22:00:00 10 mmol via INTRAVENOUS
  Filled 2019-03-25: qty 10

## 2019-03-28 ENCOUNTER — Ambulatory Visit
Admission: RE | Admit: 2019-03-28 | Discharge: 2019-03-28 | Disposition: A | Discharge status: Home / Self Care | Payer: Medicare (Managed Care) | Source: Ambulatory Visit | Attending: Neurology | Admitting: Neurology

## 2019-03-28 DIAGNOSIS — G959 Disease of spinal cord, unspecified: Secondary | ICD-10-CM | POA: Insufficient documentation

## 2019-03-28 MED ORDER — GADOBUTROL 1 MMOL/ML IV SOLN
10.00 mL | Freq: Once | INTRAVENOUS | Status: AC | PRN
Start: 2019-03-28 — End: 2019-03-28
  Administered 2019-03-28: 18:00:00 10 mmol via INTRAVENOUS
  Filled 2019-03-28: qty 10

## 2019-04-10 ENCOUNTER — Other Ambulatory Visit (INDEPENDENT_AMBULATORY_CARE_PROVIDER_SITE_OTHER): Payer: Self-pay

## 2019-04-10 DIAGNOSIS — I872 Venous insufficiency (chronic) (peripheral): Secondary | ICD-10-CM

## 2019-04-11 ENCOUNTER — Encounter (INDEPENDENT_AMBULATORY_CARE_PROVIDER_SITE_OTHER): Payer: Self-pay | Admitting: Specialist

## 2019-04-12 ENCOUNTER — Encounter (INDEPENDENT_AMBULATORY_CARE_PROVIDER_SITE_OTHER): Payer: Self-pay

## 2019-04-12 NOTE — Progress Notes (Signed)
:  Room        VASCULAR SURGERY PATIENT FORM       Appt Date/Time: 04/16/2019 @ 4:10pm   PCP: Donley Redder, MD  Referring Physician:    New Patient Follow-Up X Post-op   [place] Today X [place] - [facility] Other Imaging     Chief Complaint/HPI: F/U BLE swelling-primarily RLE.Hx of BLE venous insufficiency.   Date of Last Visit: Last OV:07/18/17 & 07/28/17 Bilateral phlebectomy w/ Dr.Hashemi   No Known Allergies     Selected Medications:    Coumadin Plavix Eliquis Xarelto Aspirin X Fish Oil Lovenox   Insulin Metformin Other Diabetes Atorvastatin  Other Cholesterol  X Crestor   MHx:  Stroke/TIA/Seizures Thyroid  Diabetes   Myocardial Infarction Arrhythmia  COPD/Asthma (other lung)   Kidney Disease Liver Disease DVT   Wounds Musculoskeletal (spine)  Cancer             Smoker      X     Former Smoker          Never Smoker    Vital Signs this Visit Pulses  HT WT HR   Rad Ulnar Brach Fem Pop DP PT       R          BP L BP R SpO2  L                        Imaging results:           Plan of Care:

## 2019-04-16 ENCOUNTER — Ambulatory Visit (INDEPENDENT_AMBULATORY_CARE_PROVIDER_SITE_OTHER): Payer: Medicare (Managed Care)

## 2019-04-16 ENCOUNTER — Encounter (INDEPENDENT_AMBULATORY_CARE_PROVIDER_SITE_OTHER): Payer: Self-pay | Admitting: Specialist

## 2019-04-16 ENCOUNTER — Ambulatory Visit (INDEPENDENT_AMBULATORY_CARE_PROVIDER_SITE_OTHER): Payer: Medicare (Managed Care) | Admitting: Specialist

## 2019-04-16 VITALS — BP 148/78 | HR 75 | Ht 73.0 in | Wt 220.0 lb

## 2019-04-16 DIAGNOSIS — I872 Venous insufficiency (chronic) (peripheral): Secondary | ICD-10-CM

## 2019-04-16 DIAGNOSIS — I890 Lymphedema, not elsewhere classified: Secondary | ICD-10-CM

## 2019-04-16 NOTE — Progress Notes (Signed)
[facility]    Chief Complaint   Patient presents with    Swelling In Extremities     BLE. RLE>LLE          History of Present Illness     [name] is a 78 y.o. male who presents Status post right greater saphenous ablation therapy.  Patient has had significant improvement, however, still has pain in the varicosities of both lower extremities.  And because of failure of conservative therapy.  He will elect to proceed with ambulatory phlebectomy.  Patient complains of significant swelling both lower extremities.  Patient has undergone bilateral lower extremity venous insufficiency.    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      glucosamine-chondroitin 500-400 MG tablet Take 1 tablet by mouth daily      Multiple Vitamins-Minerals (MULTIVITAMIN WITH MINERALS) tablet Take 1 tablet by mouth daily.      rosuvastatin (CRESTOR) 5 MG tablet TAKE 1 TABLET(S) EVERY DAY BY ORAL ROUTE at bedtime  1    rotigotine (NEUPRO) 6 MG/24HR patch Place 1 patch onto the skin daily.          vitamin D (CHOLECALCIFEROL) 1000 UNIT tablet Take 1,000 Units by mouth daily.      acetaminophen (TYLENOL) 500 MG tablet Take 2 tablets (1,000 mg total) by mouth every 8 (eight) hours.      [DISCONTINUED]  aspirin EC 81 MG EC tablet Take 1 tablet (81 mg total) by mouth 2 (two) times daily 60 tablet 0    [DISCONTINUED] celecoxib (CELEBREX) 200 MG capsule Take 1 capsule (200 mg total) by mouth daily 30 capsule 0    [DISCONTINUED] [employer]S STOOL SOFTENER 100 MG capsule TAKE 1 CAPSULE BY MOUTH TWICE A DAY AS NEEDED FOR CONSTIPATION      [DISCONTINUED] cyclobenzaprine (FLEXERIL) 10 MG tablet Take 10 mg by mouth 3 (three) times daily as needed         [DISCONTINUED] gabapentin (NEURONTIN) 300 MG capsule Take 1 capsule (300 mg total) by mouth 2 (two) times daily 60 capsule 0    [DISCONTINUED] oxyCODONE (ROXICODONE) 5 MG immediate release tablet Take 1-2 tablets every 4 hours PRN pain 60 tablet 0    [DISCONTINUED] senna-docusate (PERICOLACE) 8.6-50 MG per tablet Take 2 tablets by mouth 2 (two) times daily as needed for Constipation 100 tablet 0    [DISCONTINUED] vitamin E 1000 UNIT capsule Take 1,000 Units by mouth daily  No current facility-administered medications on file prior to visit.        Review of Systems     Constitutional: Negative for fevers and chills  Skin: No rash or lesions  Respiratory: Negative for cough, wheezing, or hemoptysis  Cardiovascular: as per HPI  Gastrointestinal: Negative for abdominal pain, nausea, vomiting and diarrhea  Musculoskeletal:  No arthritic symptoms  Genitourinary: Negative for dysuria  All other systems were reviewed and are negative except what is stated in the HPI    Physical Exam     Vitals:    04/16/19 1541   BP: 148/78   Pulse: 75       Body mass index is 29.03 kg/m.    General: Patient appears their stated age, well-nourished. Alert and in no apparent distress.  Lungs: Respiratory effort unlabored, chest expansion symmetric.  Cardiac: RRR, no carotid bruits, no JVD.   Extremities warm, Right Femoral pulses 2+, Left Femoral 2+, Right popliteal 2+, Left popliteal 2+, Right DP 2+, Left DP 2+, Right PT 2+, Left PT 2+,   Abd: Soft, nondistended, nontender. No guarding or  rebound, No mid line pulsatile mass   FAO:ZHYQ ROM in all 4 extremities, symmetric , Varicosities, both lower extremities  Skin: Color appropriate for race, Skin warm, dry, no gangrene, no non healing ulcers,  Neuro: Good insight and judgment, oriented to person, place, and time CN II-XII intact, gross motor and sensory intact      Labs     CBC:   WBC   Date/Time Value Ref Range Status   07/27/2015 02:16 PM 5.15 3.50 - 10.80 x10 3/uL Final     RBC   Date/Time Value Ref Range Status   07/27/2015 02:16 PM 4.61 (L) 4.70 - 6.00 x10 6/uL Final     Hgb   Date/Time Value Ref Range Status   12/04/2018 04:23 AM 11.7 (L) 12.5 - 17.1 g/dL Final     Hematocrit   Date/Time Value Ref Range Status   12/04/2018 04:23 AM 34.6 (L) 37.6 - 49.6 % Final     MCV   Date/Time Value Ref Range Status   07/27/2015 02:16 PM 93.1 80.0 - 100.0 fL Final     MCHC   Date/Time Value Ref Range Status   07/27/2015 02:16 PM 34.0 32.0 - 36.0 g/dL Final     RDW   Date/Time Value Ref Range Status   07/27/2015 02:16 PM 14 12 - 15 % Final     Platelets   Date/Time Value Ref Range Status   07/27/2015 02:16 PM 188 140 - 400 x10 3/uL Final       CMP:   Sodium   Date/Time Value Ref Range Status   12/04/2018 04:23 AM 138 136 - 145 mEq/L Final     Potassium   Date/Time Value Ref Range Status   12/04/2018 04:23 AM 4.7 3.5 - 5.1 mEq/L Final     Chloride   Date/Time Value Ref Range Status   12/04/2018 04:23 AM 108 100 - 111 mEq/L Final     CO2   Date/Time Value Ref Range Status   12/04/2018 04:23 AM 25 22 - 29 mEq/L Final     Glucose   Date/Time Value Ref Range Status   12/04/2018 04:23 AM 131 (H) 70 - 100 mg/dL Final     Comment:     ADA guidelines for diabetes mellitus:  Fasting:  Equal to or greater than 126 mg/dL  Random:   Equal to or greater than 200  mg/dL       BUN   Date/Time Value Ref Range Status   12/04/2018 04:23 AM 24 9 - 28 mg/dL Final     Anion Gap   Date/Time Value Ref Range Status   12/04/2018 04:23 AM 5.0 5.0 - 15.0 Final       Lipid Panel No  results found for: CHOL, TRIG, HDL    Coags: No results found for: PT, INR, PTT    Assessment and Plan       1. Venous insufficiency of both lower extremities     2. Lymphedema of both lower extremities         My impression is that he has venous insufficiency status post ablation therapy of the right greater saphenous vein.  The bilateral lower extremity swelling is secondary to significant lymphedema as there is no evidence of deep vein thrombosis with ablated saphenous vein in the right greater saphenous vein.  There is no evidence of reflux in the left greater saphenous vein.  Therefore I have recommended to the patient to use lymphedema pump and compression stockings.  This was explained to the patient and has agreed.    Romelle Starcher, MD, FACS, RPVI  [facility]  Chief, Section of Vascular Surgery  [place]  [facility]

## 2019-04-25 ENCOUNTER — Encounter (INDEPENDENT_AMBULATORY_CARE_PROVIDER_SITE_OTHER): Payer: Self-pay | Admitting: Specialist

## 2019-04-30 LAB — CBC AND DIFFERENTIAL
Baso(Absolute): 0 10*3/uL (ref 0.0–0.2)
Basos: 0 %
Eos: 3 %
Eosinophils Absolute: 0.1 10*3/uL (ref 0.0–0.4)
Hematocrit: 43.7 % (ref 37.5–51.0)
Hemoglobin: 14.9 g/dL (ref 13.0–17.7)
Immature Granulocytes Absolute: 0 10*3/uL (ref 0.0–0.1)
Immature Granulocytes: 1 %
Lymphocytes Absolute: 1.6 10*3/uL (ref 0.7–3.1)
Lymphocytes: 31 %
MCH: 31.6 pg (ref 26.6–33.0)
MCHC: 34.1 g/dL (ref 31.5–35.7)
MCV: 93 fL (ref 79–97)
Monocytes Absolute: 0.6 10*3/uL (ref 0.1–0.9)
Monocytes: 12 %
Neutrophils Absolute: 2.7 10*3/uL (ref 1.4–7.0)
Neutrophils: 53 %
Platelets: 214 10*3/uL (ref 150–450)
RBC: 4.72 x10E6/uL (ref 4.14–5.80)
RDW: 12.4 % (ref 11.6–15.4)
WBC: 5.1 10*3/uL (ref 3.4–10.8)

## 2019-04-30 LAB — COMPREHENSIVE METABOLIC PANEL
ALT: 28 IU/L (ref 0–44)
ALT: 44 IU/L (ref 0–44)
AST (SGOT): 24 IU/L (ref 0–40)
AST (SGOT): 35 IU/L (ref 0–40)
Albumin/Globulin Ratio: 2.2 (ref 1.2–2.2)
Albumin/Globulin Ratio: 2.1 (ref 1.2–2.2)
Albumin: 4.4 g/dL (ref 3.7–4.7)
Albumin: 4.4 g/dL (ref 3.5–4.8)
Alkaline Phosphatase: 64 IU/L (ref 39–117)
Alkaline Phosphatase: 59 IU/L (ref 39–117)
BUN / Creatinine Ratio: 24 (ref 10–24)
BUN / Creatinine Ratio: 18 (ref 10–24)
BUN: 21 mg/dL (ref 8–27)
BUN: 16 mg/dL (ref 8–27)
Bilirubin, Total: 0.6 mg/dL (ref 0.0–1.2)
Bilirubin, Total: 0.8 mg/dL (ref 0.0–1.2)
CO2: 25 mmol/L (ref 20–29)
CO2: 24 mmol/L (ref 20–29)
Calcium: 9.4 mg/dL (ref 8.6–10.2)
Calcium: 9.8 mg/dL (ref 8.6–10.2)
Chloride: 104 mmol/L (ref 96–106)
Chloride: 103 mmol/L (ref 96–106)
Creatinine: 0.89 mg/dL (ref 0.76–1.27)
Creatinine: 0.87 mg/dL (ref 0.76–1.27)
EGFR: 82 mL/min/{1.73_m2} (ref >59)
EGFR: 95 mL/min/{1.73_m2} (ref >59)
EGFR: 83 mL/min/{1.73_m2} (ref >59)
EGFR: 96 mL/min/{1.73_m2} (ref >59)
Globulin, Total: 2 g/dL (ref 1.5–4.5)
Globulin, Total: 2.1 g/dL (ref 1.5–4.5)
Glucose: 101 mg/dL — ABNORMAL HIGH (ref 65–99)
Glucose: 108 mg/dL — ABNORMAL HIGH (ref 65–99)
Potassium: 4.7 mmol/L (ref 3.5–5.2)
Potassium: 4.9 mmol/L (ref 3.5–5.2)
Protein, Total: 6.4 g/dL (ref 6.0–8.5)
Protein, Total: 6.5 g/dL (ref 6.0–8.5)
Sodium: 141 mmol/L (ref 134–144)
Sodium: 140 mmol/L (ref 134–144)

## 2019-04-30 LAB — LIPID PANEL
Cholesterol / HDL Ratio: 4.2 ratio (ref 0.0–5.0)
Cholesterol: 169 mg/dL (ref 100–199)
HDL: 40 mg/dL (ref >39)
LDL Calculated: 104 mg/dL — ABNORMAL HIGH (ref 0–99)
Triglycerides: 125 mg/dL (ref 0–149)
VLDL Calculated: 25 mg/dL (ref 5–40)

## 2019-04-30 LAB — PT AND APTT
PT INR: 0.9 (ref 0.8–1.2)
PT: 9.9 s (ref 9.1–12.0)
aPTT: 27 s (ref 24–33)

## 2019-04-30 LAB — MRSA CULTURE
MRSA Screening Culture: NEGATIVE
MRSA Screening Culture: NEGATIVE

## 2019-04-30 LAB — HEMOGLOBIN A1C: Hemoglobin A1C: 5.9 % — ABNORMAL HIGH (ref 4.8–5.6)

## 2019-05-27 ENCOUNTER — Encounter (INDEPENDENT_AMBULATORY_CARE_PROVIDER_SITE_OTHER): Payer: Self-pay | Admitting: Family Medicine

## 2019-05-27 ENCOUNTER — Encounter (INDEPENDENT_AMBULATORY_CARE_PROVIDER_SITE_OTHER): Payer: Self-pay

## 2019-05-27 ENCOUNTER — Ambulatory Visit (INDEPENDENT_AMBULATORY_CARE_PROVIDER_SITE_OTHER): Payer: Medicare (Managed Care) | Admitting: Family Medicine

## 2019-05-27 VITALS — BP 124/68 | HR 80 | Temp 98.1°F | Resp 18 | Ht 73.0 in | Wt 226.0 lb

## 2019-05-27 DIAGNOSIS — H6503 Acute serous otitis media, bilateral: Secondary | ICD-10-CM

## 2019-05-27 DIAGNOSIS — H6123 Impacted cerumen, bilateral: Secondary | ICD-10-CM

## 2019-05-27 MED ORDER — CEFUROXIME AXETIL 500 MG PO TABS
500.00 mg | ORAL_TABLET | Freq: Two times a day (BID) | ORAL | 0 refills | Status: AC
Start: 2019-05-27 — End: 2019-06-10

## 2019-05-27 NOTE — Progress Notes (Signed)
Subjective:      Patient ID: [name] is a 78 y.o. male     Chief Complaint   Patient presents with    Congestion     Presents c/o congestion in both ears x 1 month.        This today with a sensation of clogging in both ears right greater than left over the past 3 to 4 weeks.  He wears hearing aids, has noticed a slight decrease in his hearing.  He denies sinus congestion or fevers or URI type symptoms.       The following sections were reviewed this encounter by the provider:   Tobacco   Allergies   Meds   Problems   Med Hx   Surg Hx   Fam Hx          Review of Systems   Constitutional: Negative for chills, fatigue and fever.   HENT: Negative for ear pain, postnasal drip and sinus pain.    Eyes: Negative for visual disturbance.   Respiratory: Negative for cough and shortness of breath.           BP 124/68 (BP Site: Right arm, Patient Position: Sitting, Cuff Size: Medium)    Pulse 80    Temp 98.1 F (36.7 C) (Temporal)    Resp 18    Ht 1.854 m (6\' 1" )    Wt 102.5 kg (226 lb)    BMI 29.82 kg/m     Objective:     Physical Exam  Vitals signs reviewed.   Constitutional:       General: He is not in acute distress.     Appearance: Normal appearance.   HENT:      Head: Normocephalic.      Right Ear: There is impacted cerumen. Tympanic membrane is erythematous and retracted. Tympanic membrane has decreased mobility.      Left Ear: There is impacted cerumen. Tympanic membrane is erythematous and retracted. Tympanic membrane has decreased mobility.          Assessment:     1. Cerumen debris on tympanic membrane of both ears  - Ear cerumen removal; Future    2. Non-recurrent acute serous otitis media of both ears  - cefuroxime (CEFTIN) 500 MG tablet; Take 1 tablet (500 mg total) by mouth 2 (two) times daily for 14 days  Dispense: 28 tablet; Refill: 0  - Ambulatory referral to ENT        Plan:   Ear wax softened with debrox and then flushed with hydrogen peroxide and warm water  tx SOM with ceftin, and ENT  referral to Dr Vincente Poli due to severe SOM bilat.     [place], MD

## 2019-05-28 ENCOUNTER — Encounter (INDEPENDENT_AMBULATORY_CARE_PROVIDER_SITE_OTHER): Payer: Self-pay

## 2019-06-28 ENCOUNTER — Encounter (INDEPENDENT_AMBULATORY_CARE_PROVIDER_SITE_OTHER): Payer: Self-pay

## 2019-06-28 ENCOUNTER — Telehealth (INDEPENDENT_AMBULATORY_CARE_PROVIDER_SITE_OTHER): Payer: Self-pay | Admitting: Family Medicine

## 2019-07-29 ENCOUNTER — Encounter (INDEPENDENT_AMBULATORY_CARE_PROVIDER_SITE_OTHER): Payer: Self-pay

## 2019-09-17 ENCOUNTER — Other Ambulatory Visit (INDEPENDENT_AMBULATORY_CARE_PROVIDER_SITE_OTHER): Payer: Self-pay | Admitting: Family Medicine

## 2019-09-17 ENCOUNTER — Encounter (INDEPENDENT_AMBULATORY_CARE_PROVIDER_SITE_OTHER): Payer: Self-pay

## 2019-09-17 DIAGNOSIS — E7800 Pure hypercholesterolemia, unspecified: Secondary | ICD-10-CM

## 2019-09-17 NOTE — Telephone Encounter (Signed)
Last seen and renewed the med in 03/2019 as 90 days with one rf. Sent a remind to pt due f/u  On HLD. Via [employer]

## 2019-09-24 ENCOUNTER — Encounter (INDEPENDENT_AMBULATORY_CARE_PROVIDER_SITE_OTHER): Payer: Self-pay | Admitting: Family Medicine

## 2019-10-02 ENCOUNTER — Ambulatory Visit (INDEPENDENT_AMBULATORY_CARE_PROVIDER_SITE_OTHER): Payer: Medicare (Managed Care) | Admitting: Family Medicine

## 2019-10-02 ENCOUNTER — Encounter (INDEPENDENT_AMBULATORY_CARE_PROVIDER_SITE_OTHER): Payer: Self-pay | Admitting: Family Medicine

## 2019-10-02 VITALS — BP 144/72 | HR 70 | Temp 97.5°F | Resp 16 | Ht 71.0 in | Wt 228.0 lb

## 2019-10-02 DIAGNOSIS — Z5181 Encounter for therapeutic drug level monitoring: Secondary | ICD-10-CM

## 2019-10-02 DIAGNOSIS — M5430 Sciatica, unspecified side: Secondary | ICD-10-CM | POA: Insufficient documentation

## 2019-10-02 DIAGNOSIS — R739 Hyperglycemia, unspecified: Secondary | ICD-10-CM

## 2019-10-02 DIAGNOSIS — R600 Localized edema: Secondary | ICD-10-CM | POA: Insufficient documentation

## 2019-10-02 DIAGNOSIS — M1712 Unilateral primary osteoarthritis, left knee: Secondary | ICD-10-CM

## 2019-10-02 DIAGNOSIS — M7918 Myalgia, other site: Secondary | ICD-10-CM | POA: Insufficient documentation

## 2019-10-02 DIAGNOSIS — M1711 Unilateral primary osteoarthritis, right knee: Secondary | ICD-10-CM

## 2019-10-02 DIAGNOSIS — M5136 Other intervertebral disc degeneration, lumbar region: Secondary | ICD-10-CM

## 2019-10-02 DIAGNOSIS — M170 Bilateral primary osteoarthritis of knee: Secondary | ICD-10-CM

## 2019-10-02 DIAGNOSIS — M48061 Spinal stenosis, lumbar region without neurogenic claudication: Secondary | ICD-10-CM | POA: Insufficient documentation

## 2019-10-02 DIAGNOSIS — E7800 Pure hypercholesterolemia, unspecified: Secondary | ICD-10-CM

## 2019-10-02 NOTE — Progress Notes (Signed)
Subjective:      Patient ID: [name] is a 78 y.o. male.    Chief Complaint:  Chief Complaint   Patient presents with    Hyperlipidemia     f/u on Medications. gchen MA       HPI:  Hyperlipidemia, hx of elevated sugar and joint pains and back pain.   Compliant with medication. Compliant with low saturated and low carbohydrate Diet  He c/o with Myalgias,  Muscle aches , bodyaches  No hx abnormal liver test related to statin therapy   Exercise . 2-3 times a week. Yoga and strength   A\ttempted to eat low fat and low carbs .   He continues tio have problems with joints -- particularly knees, but also hips and back.He has some difficulty walking  But is trying to stay active.     Problem List:  Patient Active Problem List   Diagnosis    Dizziness and giddiness    Gross hematuria    Tear of medial meniscus of left knee    Venous insufficiency of both lower extremities    Localized osteoarthritis of left knee    Osteoarthritis of right knee    Lymphedema of both lower extremities    Spinal stenosis of lumbar region    Sciatica    Restless legs    Pain in buttock    Lower leg edema    Hyperlipidemia    Degeneration of lumbar intervertebral disc    Benign prostatic hyperplasia       Current Medications:  Current Outpatient Medications   Medication Sig Dispense Refill    b complex vitamins tablet Take 1 tablet by mouth daily.      glucosamine-chondroitin 500-400 MG tablet Take 1 tablet by mouth daily      Multiple Vitamins-Minerals (MULTIVITAMIN WITH MINERALS) tablet Take 1 tablet by mouth daily.      rosuvastatin (CRESTOR) 5 MG tablet TAKE 1 TABLET(S) EVERY DAY BY ORAL ROUTE. 30 tablet 0    rotigotine (NEUPRO) 6 MG/24HR patch Place 1 patch onto the skin daily.          vitamin D (CHOLECALCIFEROL) 1000 UNIT tablet Take 1,000 Units by mouth daily.       No current facility-administered medications for this visit.        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    Venous insufficiency of both lower extremities 12/20/2016       Past Surgical History:  Past Surgical History:   Procedure Laterality Date    ANKLE FRACTURE SURGERY Left 2013    ARTHROPLASTY, KNEE, TOTAL, COMPUTER NAVIGATION Left 04/17/2017    Procedure: ARTHROPLASTY, KNEE, TOTAL,  COMPUTER NAVIGATION;  Surgeon: Cathey Endow, MD;  Location: [facility];  Service: Orthopedics;  Laterality: Left;  LEFT TOTAL KNEE ARTHROPLASTY WITH NAVIGATION    ARTHROPLASTY, KNEE, TOTAL, COMPUTER NAVIGATION Right 12/03/2018    Procedure: ARTHROPLASTY, KNEE, TOTAL, COMPUTER NAVIGATION;  Surgeon: Cathey Endow, MD;  Location: [facility];  Service: Orthopedics;  Laterality: Right;  RIGHT ARTHROPLASTY, KNEE, TOTAL, W/ NAVIGATION      ARTHROSCOPY, KNEE Left 03/22/2016    Procedure: ARTHROSCOPY, KNEE;  Surgeon: [name], MD;  Location: [facility];  Service: Orthopedics;  Laterality: Left;  LEFT KNEE PARTIAL MEDIAL AND LATERAL MENISCECTOMY    COLONOSCOPY  07/22/2015    COSMETIC SURGERY  unknown    eye lift    CYSTOSCOPY, TURP N/A 04/23/2015    Procedure: CYSTOSCOPY, TURP, BLADDER BIOPSY;  Surgeon: Jobe Gibbon, MD;  Location: [facility];  Service: Urology;  Laterality: N/A;  CYSTOSCOPY, CLOT EVACUATION,  TURP, BLADDER BIOPSY w/FULGERATION    HERNIA REPAIR  2008    KNEE ARTHROSCOPY W/ MENISCAL REPAIR Left 2012    PHLEBECTOMY Bilateral 07/28/2017    Procedure:  PHLEBECTOMY BILATERAL LEG;  Surgeon: Fransico Setters, MD;  Location: [facility];  Service: Cardiovascular;  Laterality: Bilateral;    SKIN BIOPSY  2017       Family History:  Family History   Problem Relation Age of Onset    Stroke Mother     Suicidality Father         died age 63       Social History:  Social History     Socioeconomic History    Marital status: Married     Spouse name: Not on file    Number of children: Not on file    Years of education: Not on file    Highest education level: Not on file   Occupational History    Not on file   Social Needs    Financial resource strain: Not on file    Food insecurity     Worry: Not on file     Inability: Not on file    Transportation needs     Medical: Not on file     Non-medical: Not on file   Tobacco Use    Smoking status: Former Smoker     Packs/day: 0.25     Years: 25.00     Pack years: 6.25     Quit date: 03/21/1978     Years since quitting: 41.5    Smokeless tobacco: Never Used   Substance and Sexual Activity    Alcohol use: Yes     Frequency: 4 or more times a week     Comment: 2-3x/week    Drug use: Never    Sexual activity: Yes     Partners: Female     Birth control/protection: None   Lifestyle    Physical activity     Days per week: Not on file     Minutes per session: Not on file    Stress: Not on file   Relationships    Social connections     Talks on phone: Not on file     Gets together: Not on file     Attends religious service: Not on file     Active member of club or organization: Not on file     Attends meetings of clubs or organizations: Not on file     Relationship  status: Not on file    Intimate partner violence     Fear of current or ex partner: Not on file     Emotionally abused: Not on file     Physically abused: Not on file     Forced sexual activity: Not on file   Other Topics Concern    Not on file   Social History Narrative    Not on file       The following sections were reviewed this encounter by the  provider:   Tobacco   Allergies   Meds   Problems   Med Hx   Surg Hx   Fam Hx          ROS:  Review of Systems   Constitutional: Positive for fatigue. Negative for activity change, appetite change, chills and fever.   HENT: Negative.  Negative for congestion, ear pain, mouth sores and sore throat.    Eyes: Negative.  Negative for discharge.   Respiratory: Negative for cough, chest tightness, shortness of breath and wheezing.    Cardiovascular: Negative.  Negative for chest pain, palpitations and leg swelling.   Gastrointestinal: Negative for abdominal pain, constipation, diarrhea, nausea and vomiting.   Endocrine: Negative.  Negative for cold intolerance and heat intolerance.   Genitourinary: Negative.    Musculoskeletal: Positive for arthralgias, back pain, gait problem and myalgias.   Skin: Negative for color change, pallor and rash.   Allergic/Immunologic: Negative for environmental allergies.   Neurological: Positive for weakness. Negative for dizziness and headaches.   Hematological: Negative.  Negative for adenopathy. Does not bruise/bleed easily.   Psychiatric/Behavioral: Positive for sleep disturbance.       Vitals:  BP 144/72    Pulse 70    Temp 97.5 F (36.4 C)    Resp 16    Ht 1.803 m (5\' 11" )    Wt 103.4 kg (228 lb)    BMI 31.80 kg/m      Objective:     Physical Exam:  Physical Exam  Vitals signs reviewed.   Constitutional:       General: He is not in acute distress.     Appearance: Normal appearance. He is not ill-appearing or diaphoretic.   HENT:      Head: Normocephalic and atraumatic.      Right Ear: Tympanic membrane, ear canal and external ear normal.      Left Ear: Tympanic membrane, ear canal and external ear normal.      Nose: Nose normal.      Mouth/Throat:      Mouth: Mucous membranes are moist.      Pharynx: Oropharynx is clear.   Eyes:      Extraocular Movements: Extraocular movements intact.      Conjunctiva/sclera: Conjunctivae normal.      Pupils: Pupils are equal, round, and reactive  to light.   Neck:      Musculoskeletal: Normal range of motion and neck supple.   Cardiovascular:      Rate and Rhythm: Normal rate and regular rhythm.      Pulses: Normal pulses.      Heart sounds: Normal heart sounds. No murmur. No gallop.    Pulmonary:      Effort: Pulmonary effort is normal.      Breath sounds: Normal breath sounds.   Abdominal:      General: Abdomen is flat. Bowel sounds are normal.      Palpations: Abdomen is soft.   Musculoskeletal:  General: Deformity (He has prominent degenerative changes in the knees as well as a slow and guarded gait with a slight limp.) present.   Skin:     General: Skin is warm and dry.   Neurological:      General: No focal deficit present.      Mental Status: He is alert and oriented to person, place, and time. Mental status is at baseline.      Cranial Nerves: No cranial nerve deficit.      Motor: No weakness.   Psychiatric:         Mood and Affect: Mood normal.         Behavior: Behavior normal.         Thought Content: Thought content normal.         Judgment: Judgment normal.          Assessment:     1. Pure hypercholesterolemia  - Lipid panel  - Comprehensive metabolic panel    2. Localized osteoarthritis of left knee  - CBC and differential  - Sedimentation rate (ESR)    3. Primary osteoarthritis of right knee  - CBC and differential  - Sedimentation rate (ESR)    4. Hyperglycemia  - Hemoglobin A1C    5. Encounter for medication monitoring  - Comprehensive metabolic panel    6. Degeneration of lumbar intervertebral disc      Plan:     Start Co Q 10 and if no improvement , consider adding meloxicam  I will notify when lab results are available and make appropriate recommendations at that time .  Recommended healthy , Mediterranean type diet , avoidance of refined sugars and controlled carbs with regular exercise . Follow up 3-6 months       Donley Redder, MD

## 2019-10-03 LAB — COMPREHENSIVE METABOLIC PANEL
ALT: 25 IU/L (ref 0–44)
AST (SGOT): 26 IU/L (ref 0–40)
Albumin/Globulin Ratio: 1.8 (ref 1.2–2.2)
Albumin: 4.6 g/dL (ref 3.7–4.7)
Alkaline Phosphatase: 79 IU/L (ref 39–117)
BUN / Creatinine Ratio: 15 (ref 10–24)
BUN: 12 mg/dL (ref 8–27)
Bilirubin, Total: 0.7 mg/dL (ref 0.0–1.2)
CO2: 24 mmol/L (ref 20–29)
Calcium: 9.5 mg/dL (ref 8.6–10.2)
Chloride: 104 mmol/L (ref 96–106)
Creatinine: 0.82 mg/dL (ref 0.76–1.27)
EGFR: 85 mL/min/{1.73_m2} (ref >59)
EGFR: 98 mL/min/{1.73_m2} (ref >59)
Globulin, Total: 2.5 g/dL (ref 1.5–4.5)
Glucose: 103 mg/dL — ABNORMAL HIGH (ref 65–99)
Potassium: 4.8 mmol/L (ref 3.5–5.2)
Protein, Total: 7.1 g/dL (ref 6.0–8.5)
Sodium: 143 mmol/L (ref 134–144)

## 2019-10-03 LAB — CBC AND DIFFERENTIAL
Baso(Absolute): 0 10*3/uL (ref 0.0–0.2)
Basos: 0 %
Eos: 3 %
Eosinophils Absolute: 0.1 10*3/uL (ref 0.0–0.4)
Hematocrit: 44.3 % (ref 37.5–51.0)
Hemoglobin: 15.1 g/dL (ref 13.0–17.7)
Immature Granulocytes Absolute: 0 10*3/uL (ref 0.0–0.1)
Immature Granulocytes: 0 %
Lymphocytes Absolute: 1.1 10*3/uL (ref 0.7–3.1)
Lymphocytes: 24 %
MCH: 31 pg (ref 26.6–33.0)
MCHC: 34.1 g/dL (ref 31.5–35.7)
MCV: 91 fL (ref 79–97)
Monocytes Absolute: 0.5 10*3/uL (ref 0.1–0.9)
Monocytes: 10 %
Neutrophils Absolute: 3 10*3/uL (ref 1.4–7.0)
Neutrophils: 63 %
Platelets: 209 10*3/uL (ref 150–450)
RBC: 4.87 x10E6/uL (ref 4.14–5.80)
RDW: 12.4 % (ref 11.6–15.4)
WBC: 4.7 10*3/uL (ref 3.4–10.8)

## 2019-10-03 LAB — HEMOGLOBIN A1C: Hemoglobin A1C: 5.8 % — ABNORMAL HIGH (ref 4.8–5.6)

## 2019-10-03 LAB — LIPID PANEL
Cholesterol / HDL Ratio: 3.4 ratio (ref 0.0–5.0)
Cholesterol: 196 mg/dL (ref 100–199)
HDL: 58 mg/dL (ref >39)
LDL Chol Calculated (NIH): 121 mg/dL — ABNORMAL HIGH (ref 0–99)
Triglycerides: 92 mg/dL (ref 0–149)
VLDL Calculated: 17 mg/dL (ref 5–40)

## 2019-10-03 LAB — SEDIMENTATION RATE: Sed Rate: 2 mm/hr (ref 0–30)

## 2019-10-08 NOTE — Progress Notes (Signed)
Lipids look okay although LDL remains borderline at 161.  Chemistry panel was fine with a borderline sugar of 103 and hemoglobin A1c of 5.8 is also in the  prediabetic range although slightly better than last.  CBC and sed rate were normal.  Continue efforts at a healthy diet and regular physical activity and recheck lab work in 6 months.

## 2019-10-12 ENCOUNTER — Other Ambulatory Visit (INDEPENDENT_AMBULATORY_CARE_PROVIDER_SITE_OTHER): Payer: Self-pay | Admitting: Family Medicine

## 2019-10-12 DIAGNOSIS — E7800 Pure hypercholesterolemia, unspecified: Secondary | ICD-10-CM

## 2019-10-12 NOTE — Telephone Encounter (Signed)
Last seen for HLD on 10/02/19. Rx last prescribed on 09/18/19 #30 with 0 refills.

## 2019-10-23 ENCOUNTER — Ambulatory Visit (INDEPENDENT_AMBULATORY_CARE_PROVIDER_SITE_OTHER): Payer: Medicare (Managed Care)

## 2019-10-23 DIAGNOSIS — Z23 Encounter for immunization: Secondary | ICD-10-CM

## 2019-11-20 ENCOUNTER — Ambulatory Visit (INDEPENDENT_AMBULATORY_CARE_PROVIDER_SITE_OTHER): Payer: Medicare (Managed Care)

## 2019-11-20 DIAGNOSIS — Z23 Encounter for immunization: Secondary | ICD-10-CM

## 2019-12-04 ENCOUNTER — Encounter (INDEPENDENT_AMBULATORY_CARE_PROVIDER_SITE_OTHER): Payer: Self-pay

## 2020-01-29 ENCOUNTER — Other Ambulatory Visit (INDEPENDENT_AMBULATORY_CARE_PROVIDER_SITE_OTHER): Payer: Self-pay | Admitting: Family Medicine

## 2020-01-29 ENCOUNTER — Encounter (INDEPENDENT_AMBULATORY_CARE_PROVIDER_SITE_OTHER): Payer: Self-pay

## 2020-01-29 DIAGNOSIS — E7800 Pure hypercholesterolemia, unspecified: Secondary | ICD-10-CM

## 2020-01-29 NOTE — Telephone Encounter (Signed)
Last seen in 09/2019 , last renewed in 10/2019 as 90 days only. Sent a reminder to patient via mychart  patient is due f/u appiontment

## 2020-02-01 ENCOUNTER — Ambulatory Visit (INDEPENDENT_AMBULATORY_CARE_PROVIDER_SITE_OTHER): Payer: Medicare (Managed Care) | Admitting: Family Medicine

## 2020-02-01 ENCOUNTER — Encounter (INDEPENDENT_AMBULATORY_CARE_PROVIDER_SITE_OTHER): Payer: Self-pay | Admitting: Family Medicine

## 2020-02-01 VITALS — BP 132/70 | HR 68 | Temp 97.6°F | Resp 18 | Ht 73.0 in | Wt 229.0 lb

## 2020-02-01 DIAGNOSIS — H60503 Unspecified acute noninfective otitis externa, bilateral: Secondary | ICD-10-CM

## 2020-02-01 MED ORDER — NEOMYCIN-POLYMYXIN-HC 3.5-10000-1 OT SUSP
3.00 [drp] | Freq: Four times a day (QID) | OTIC | 0 refills | Status: AC
Start: 2020-02-01 — End: 2020-02-08

## 2020-02-01 MED ORDER — NEOMYCIN-POLYMYXIN-HC 3.5-10000-1 OT SUSP
3.00 [drp] | Freq: Four times a day (QID) | OTIC | 0 refills | Status: DC
Start: 2020-02-01 — End: 2020-02-01

## 2020-02-01 NOTE — Progress Notes (Signed)
[facility]                       Date of Exam: 02/01/2020 11:51 AM        Patient ID: [name] is a 79 y.o. male.  Attending Physician: Ria Comment, MD        Chief Complaint:    Chief Complaint   Patient presents with    Otalgia    Ear Drainage               HPI:    Irritation in the right ear.  This began about 1 week ago but he has had this happen in the past. He has noted some moisture / clear drainage from the ear.  Using hearing aids that go inside the ear.  He denies any vertigo.  He has been using Debrox to clean his ears due to build up with wax.  He has been cleaning his ears using th pulse setting on his shower head.  He denies any fevers, chills or systemic symptoms.             Problem List:    Patient Active Problem List   Diagnosis    Dizziness and giddiness    Gross hematuria    Tear of medial meniscus of left knee    Venous insufficiency of both lower extremities    Localized osteoarthritis of left knee    Osteoarthritis of right knee    Lymphedema of both lower extremities    Spinal stenosis of lumbar region    Sciatica    Restless legs    Pain in buttock    Lower leg edema    Hyperlipidemia    Degeneration of lumbar intervertebral disc    Benign prostatic hyperplasia             Current Meds:    Outpatient Medications Marked as Taking for the 02/01/20 encounter (Office Visit) with Ria Comment, MD   Medication Sig Dispense Refill    b complex vitamins tablet Take 1 tablet by mouth daily.      Coenzyme Q10 (COQ-10 PO) Take by mouth      glucosamine-chondroitin 500-400 MG tablet Take 1 tablet by mouth daily      Multiple Vitamins-Minerals (MULTIVITAMIN WITH MINERALS) tablet Take 1 tablet by mouth daily.      rosuvastatin (CRESTOR) 5 MG tablet TAKE 1 TABLET BY MOUTH EVERY DAY 90 tablet 0    rotigotine (NEUPRO) 6 MG/24HR patch Place 1 patch onto the skin daily.          TURMERIC PO Take by mouth       vitamin D (CHOLECALCIFEROL) 1000 UNIT tablet Take 1,000 Units by mouth daily.            Allergies:    No Known Allergies          Past Surgical History:    Past Surgical History:   Procedure Laterality Date    ANKLE FRACTURE SURGERY Left 2013    ARTHROPLASTY, KNEE, TOTAL, COMPUTER NAVIGATION Left 04/17/2017    Procedure: ARTHROPLASTY, KNEE, TOTAL, COMPUTER NAVIGATION;  Surgeon: Cathey Endow, MD;  Location: [facility];  Service: Orthopedics;  Laterality: Left;  LEFT TOTAL KNEE ARTHROPLASTY WITH NAVIGATION    ARTHROPLASTY, KNEE, TOTAL, COMPUTER NAVIGATION Right 12/03/2018    Procedure: ARTHROPLASTY, KNEE, TOTAL, COMPUTER NAVIGATION;  Surgeon: Cathey Endow, MD;  Location: [facility];  Service: Orthopedics;  Laterality: Right;  RIGHT ARTHROPLASTY, KNEE, TOTAL, W/ NAVIGATION      ARTHROSCOPY, KNEE Left 03/22/2016    Procedure: ARTHROSCOPY, KNEE;  Surgeon: [name], MD;  Location: [facility];  Service: Orthopedics;  Laterality: Left;  LEFT KNEE PARTIAL MEDIAL AND LATERAL MENISCECTOMY    COLONOSCOPY  07/22/2015    COSMETIC SURGERY  unknown    eye lift    CYSTOSCOPY, TURP N/A 04/23/2015    Procedure: CYSTOSCOPY, TURP, BLADDER BIOPSY;  Surgeon: Jobe Gibbon, MD;  Location: [facility];  Service: Urology;  Laterality: N/A;  CYSTOSCOPY, CLOT EVACUATION,  TURP, BLADDER BIOPSY w/FULGERATION    HERNIA REPAIR  2008    KNEE ARTHROSCOPY W/ MENISCAL REPAIR Left 2012    PHLEBECTOMY Bilateral 07/28/2017    Procedure: PHLEBECTOMY BILATERAL LEG;  Surgeon: Fransico Setters, MD;  Location: [facility];  Service: Cardiovascular;  Laterality: Bilateral;    SKIN BIOPSY  2017           Family History:    Family History   Problem Relation Age of Onset    Stroke Mother     Suicidality Father         died age 68           Social History:    Social History     Tobacco Use    Smoking status: Former Smoker     Packs/day: 0.25     Years: 25.00     Pack years: 6.25     Quit date: 03/21/1978      Years since quitting: 42.0    Smokeless tobacco: Never Used   Vaping Use    Vaping Use: Never used   Substance Use Topics    Alcohol use: Yes     Comment: 2-3x/week    Drug use: Never          The following sections were reviewed this encounter by the provider:            Vital Signs:    BP 132/70 (BP Site: Left arm, Patient Position: Sitting, Cuff Size: Medium)    Pulse 68    Temp 97.6 F (36.4 C) (Temporal)    Resp 18    Ht 1.854 m (6\' 1" )    Wt 103.9 kg (229 lb)    BMI 30.21 kg/m          ROS:    Review of Systems           Physical Exam:    Physical Exam  HENT:      Head: Normocephalic and atraumatic.      Right Ear: There is impacted cerumen.      Left Ear: Tympanic membrane normal.      Nose: Rhinorrhea present.      Mouth/Throat:      Mouth: Mucous membranes are moist.   Eyes:      Pupils: Pupils are equal, round, and reactive to light.   Cardiovascular:      Rate and Rhythm: Normal rate and regular rhythm.   Pulmonary:      Breath sounds: Normal breath sounds.   Abdominal:      Palpations: Abdomen is soft.   Musculoskeletal:      Cervical back: Neck supple. No tenderness.   Neurological:      Mental Status: He is alert.              Assessment:    1. Acute  otitis externa of both ears, unspecified type  - neomycin-polymyxin-hydrocortisone (CORTISPORIN) 3.5-10000-1 otic suspension; Place 3 drops into both ears 4 (four) times daily for 7 days  Dispense: 10 mL; Refill: 0            Plan:      Follow up if symptoms are not improving in 5-7 days or sooner as needed for new or worsening symptoms.           Follow-up:    No follow-ups on file.         Ria Comment, MD

## 2020-03-03 ENCOUNTER — Encounter (INDEPENDENT_AMBULATORY_CARE_PROVIDER_SITE_OTHER): Payer: Self-pay

## 2020-03-24 ENCOUNTER — Encounter (INDEPENDENT_AMBULATORY_CARE_PROVIDER_SITE_OTHER): Payer: Self-pay | Admitting: Family Medicine

## 2020-04-24 ENCOUNTER — Other Ambulatory Visit (INDEPENDENT_AMBULATORY_CARE_PROVIDER_SITE_OTHER): Payer: Self-pay | Admitting: Family Medicine

## 2020-04-24 DIAGNOSIS — E7800 Pure hypercholesterolemia, unspecified: Secondary | ICD-10-CM

## 2020-04-27 ENCOUNTER — Encounter (INDEPENDENT_AMBULATORY_CARE_PROVIDER_SITE_OTHER): Payer: Self-pay

## 2020-06-11 ENCOUNTER — Other Ambulatory Visit (INDEPENDENT_AMBULATORY_CARE_PROVIDER_SITE_OTHER): Payer: Self-pay | Admitting: Family Medicine

## 2020-06-11 DIAGNOSIS — E7800 Pure hypercholesterolemia, unspecified: Secondary | ICD-10-CM

## 2020-06-12 ENCOUNTER — Encounter (INDEPENDENT_AMBULATORY_CARE_PROVIDER_SITE_OTHER): Payer: Self-pay

## 2020-06-16 ENCOUNTER — Telehealth (INDEPENDENT_AMBULATORY_CARE_PROVIDER_SITE_OTHER): Payer: Self-pay | Admitting: Family Medicine

## 2020-06-16 DIAGNOSIS — E7800 Pure hypercholesterolemia, unspecified: Secondary | ICD-10-CM

## 2020-06-16 MED ORDER — ROSUVASTATIN CALCIUM 5 MG PO TABS
5.0000 mg | ORAL_TABLET | Freq: Every day | ORAL | 0 refills | Status: DC
Start: 2020-06-16 — End: 2020-07-09

## 2020-06-16 NOTE — Telephone Encounter (Signed)
Caller: Pt's spouse, Tijuan Dantes  Rx Refill Request: rosuvastatin (CRESTOR) 5 MG tablet  Days left of Rx: Out  Ph[facility]: [facility] - [address] [place], [place]  Patient's upcoming OV: 07/07/20, Last: OV - f/u chol on 10/02/2019  Caller's preferred ph: [phone]  Patient available on mychart: Yes  Please note: Pt's spouse called requesting Rx refill and scheduled f/u for Pt on 07/07/20. Please advise.    -Jmedrano, paa1

## 2020-06-16 NOTE — Telephone Encounter (Signed)
Disp Refills Start End     rosuvastatin (CRESTOR) 5 MG tablet 90 tablet 0 01/29/2020     Sig: TAKE 1 TABLET BY MOUTH EVERY DAY    Sent to pharmacy as: rosuvastatin (CRESTOR) 5 MG tablet    Class: E-Rx    E-Prescribing Status: Receipt confirmed by pharmacy (01/29/2020 5:06 PM EDT)        Last OV 10/02/2019, upcoming OV 07/07/20    LM at [facility] preferred ph: [phone] - 30 day bridge sent to pharm provided.

## 2020-07-07 ENCOUNTER — Ambulatory Visit (INDEPENDENT_AMBULATORY_CARE_PROVIDER_SITE_OTHER): Payer: Medicare (Managed Care) | Admitting: Family Medicine

## 2020-07-07 ENCOUNTER — Encounter (INDEPENDENT_AMBULATORY_CARE_PROVIDER_SITE_OTHER): Payer: Self-pay | Admitting: Family Medicine

## 2020-07-07 VITALS — BP 126/82 | HR 78 | Temp 98.2°F | Resp 16 | Ht 73.0 in | Wt 227.0 lb

## 2020-07-07 DIAGNOSIS — E538 Deficiency of other specified B group vitamins: Secondary | ICD-10-CM

## 2020-07-07 DIAGNOSIS — M79674 Pain in right toe(s): Secondary | ICD-10-CM

## 2020-07-07 DIAGNOSIS — Z23 Encounter for immunization: Secondary | ICD-10-CM

## 2020-07-07 DIAGNOSIS — R7301 Impaired fasting glucose: Secondary | ICD-10-CM

## 2020-07-07 DIAGNOSIS — E782 Mixed hyperlipidemia: Secondary | ICD-10-CM

## 2020-07-07 DIAGNOSIS — G8929 Other chronic pain: Secondary | ICD-10-CM

## 2020-07-07 DIAGNOSIS — N400 Enlarged prostate without lower urinary tract symptoms: Secondary | ICD-10-CM

## 2020-07-07 DIAGNOSIS — M7021 Olecranon bursitis, right elbow: Secondary | ICD-10-CM

## 2020-07-07 NOTE — Progress Notes (Signed)
Subjective:      Patient ID: Tina Temme is a 79 y.o. male.    Chief Complaint:  Chief Complaint   Patient presents with    Hyperlipidemia       HPI:  Hyperlipidemia: Rosuvastatin 5 mg qd   Overall he is doing ok .     He has a history of B12 deficiency and impaired fasting sugar and BPH and needs lab work and follow-up.    He c/o left great toe swollen and sore x a year  Also swollen on right elbow x 2 months   Compliant with medication. Tolerating medication well without SE  No Myalgias,  Muscle aches , bodyaches  Last lipids checked : 09/2019 . They were ok   No hx abnormal liver test related to statin therapy   Mostly compliant with low saturated and low carbohydrate Diet  Exercise : yoga twice a week. Walking couple of times a week. A mile to 1.5 miles   Problem List:  Patient Active Problem List   Diagnosis    Dizziness and giddiness    Gross hematuria    Tear of medial meniscus of left knee    Venous insufficiency of both lower extremities    Localized osteoarthritis of left knee    Osteoarthritis of right knee    Lymphedema of both lower extremities    Spinal stenosis of lumbar region    Sciatica    Restless legs    Pain in buttock    Lower leg edema    Hyperlipidemia    Degeneration of lumbar intervertebral disc    Benign prostatic hyperplasia       Current Medications:  Current Outpatient Medications   Medication Sig Dispense Refill    Coenzyme Q10 (COQ-10 PO) Take by mouth      glucosamine-chondroitin 500-400 MG tablet Take 1 tablet by mouth daily      Multiple Vitamins-Minerals (MULTIVITAMIN WITH MINERALS) tablet Take 1 tablet by mouth daily.      Rotigotine (Neupro) 8 MG/24HR Patch 24 hr Place 1 patch onto the skin daily         vitamin B-12 (CYANOCOBALAMIN) 500 MCG tablet Take 500 mcg by mouth daily      vitamin D (CHOLECALCIFEROL) 1000 UNIT tablet Take 1,000 Units by mouth daily.      rosuvastatin (CRESTOR) 5 MG tablet TAKE 1 TABLET BY MOUTH EVERY DAY 90 tablet 1      No current facility-administered medications for this visit.       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    Venous insufficiency of both lower extremities 12/20/2016       Past Surgical  History:  Past Surgical History:   Procedure Laterality Date    ANKLE FRACTURE SURGERY Left 2013    ARTHROPLASTY, KNEE, TOTAL, COMPUTER NAVIGATION Left 04/17/2017    Procedure: ARTHROPLASTY, KNEE, TOTAL, COMPUTER NAVIGATION;  Surgeon: Cathey Endow, MD;  Location: [facility];  Service: Orthopedics;  Laterality: Left;  LEFT TOTAL KNEE ARTHROPLASTY WITH NAVIGATION    ARTHROPLASTY, KNEE, TOTAL, COMPUTER NAVIGATION Right 12/03/2018    Procedure: ARTHROPLASTY, KNEE, TOTAL, COMPUTER NAVIGATION;  Surgeon: Cathey Endow, MD;  Location: [facility];  Service: Orthopedics;  Laterality: Right;  RIGHT ARTHROPLASTY, KNEE, TOTAL, W/ NAVIGATION      ARTHROSCOPY, KNEE Left 03/22/2016    Procedure: ARTHROSCOPY, KNEE;  Surgeon: [name], MD;  Location: [facility];  Service: Orthopedics;  Laterality: Left;  LEFT KNEE PARTIAL MEDIAL AND LATERAL MENISCECTOMY    COLONOSCOPY  07/22/2015    COSMETIC SURGERY  unknown    eye lift    CYSTOSCOPY, TURP N/A 04/23/2015    Procedure: CYSTOSCOPY, TURP, BLADDER BIOPSY;  Surgeon: Jobe Gibbon, MD;  Location: [facility];  Service: Urology;  Laterality: N/A;  CYSTOSCOPY, CLOT EVACUATION,  TURP, BLADDER BIOPSY  w/FULGERATION    HERNIA REPAIR  2008    KNEE ARTHROSCOPY W/ MENISCAL REPAIR Left 2012    PHLEBECTOMY Bilateral 07/28/2017    Procedure: PHLEBECTOMY BILATERAL LEG;  Surgeon: Fransico Setters, MD;  Location: [facility];  Service: Cardiovascular;  Laterality: Bilateral;    SKIN BIOPSY  2017       Family History:  Family History   Problem Relation Age of Onset    Stroke Mother     Suicidality Father         died age 69       Social History:  Social History     Socioeconomic History    Marital status: Married     Spouse name: Not on file    Number of children: Not on file    Years of education: Not on file    Highest education level: Not on file   Occupational History    Not on file   Tobacco Use    Smoking status: Former Smoker     Packs/day: 0.25     Years: 25.00     Pack years: 6.25     Quit date: 03/21/1978     Years since quitting: 42.3    Smokeless tobacco: Never Used   Vaping Use    Vaping Use: Never used   Substance and Sexual Activity    Alcohol use: Yes     Alcohol/week: 2.0 standard drinks     Types: 1 Glasses of wine, 1 Cans of beer per week     Comment: daily.     Drug use: Never    Sexual activity: Yes     Partners: Female     Birth control/protection: None   Other Topics Concern    Not on file   Social History Narrative    Not on file     Social Determinants of Health     Financial Resource Strain:     Difficulty of Paying Living Expenses:    Food Insecurity:     Worried About Programme researcher, broadcasting/film/video in the Last Year:     Barista in the Last Year:    Transportation Needs:     Freight forwarder (Medical):     Lack of Transportation (Non-Medical):  Physical Activity:     Days of Exercise per Week:     Minutes of Exercise per Session:    Stress:     Feeling of Stress :    Social Connections:     Frequency of Communication with Friends and Family:     Frequency of Social Gatherings with Friends and Family:     Attends Religious Services:     Active Member of  Clubs or Organizations:     Attends Banker Meetings:     Marital Status:    Intimate Partner Violence:     Fear of Current or Ex-Partner:     Emotionally Abused:     Physically Abused:     Sexually Abused:        The following sections were reviewed this encounter by the provider:   Tobacco   Allergies   Meds   Problems   Med Hx   Surg Hx   Fam Hx          ROS:  Review of Systems   Constitutional: Negative for activity change, appetite change and fatigue.   HENT: Negative.    Eyes: Negative.    Respiratory: Negative for cough, chest tightness and shortness of breath.    Cardiovascular: Negative for chest pain, palpitations and leg swelling.   Gastrointestinal: Negative for abdominal pain, constipation, diarrhea and nausea.   Endocrine: Negative.    Genitourinary: Negative.    Musculoskeletal:        Swollen distal right foot times a year    Swelling over the lateral right elbow several months   Skin: Negative for color change, pallor and rash.   Allergic/Immunologic: Negative for environmental allergies.   Neurological: Negative for dizziness and headaches.   Hematological: Negative.  Negative for adenopathy. Does not bruise/bleed easily.   Psychiatric/Behavioral: Negative.        Vitals:  BP 126/82    Pulse 78    Temp 98.2 F (36.8 C)    Resp 16    Ht 1.854 m (6\' 1" )    Wt 103 kg (227 lb)    BMI 29.95 kg/m      Objective:     Physical Exam:  Physical Exam  Vitals reviewed.   Constitutional:       General: He is not in acute distress.     Appearance: Normal appearance. He is not ill-appearing or diaphoretic.   HENT:      Head: Normocephalic and atraumatic.      Right Ear: Tympanic membrane, ear canal and external ear normal.      Left Ear: Tympanic membrane, ear canal and external ear normal.      Nose: Nose normal.      Mouth/Throat:      Mouth: Mucous membranes are moist.      Pharynx: Oropharynx is clear.   Eyes:      Extraocular Movements: Extraocular movements intact.       Conjunctiva/sclera: Conjunctivae normal.      Pupils: Pupils are equal, round, and reactive to light.   Cardiovascular:      Rate and Rhythm: Normal rate and regular rhythm.      Pulses: Normal pulses.      Heart sounds: Normal heart sounds. No murmur heard.   No gallop.    Pulmonary:      Effort: Pulmonary effort is normal.      Breath sounds: Normal breath sounds.   Abdominal:      General: Abdomen  is flat. Bowel sounds are normal.      Palpations: Abdomen is soft.   Musculoskeletal:      Cervical back: Normal range of motion and neck supple.      Comments: His right foot is moderately swollen with a significant amount of swelling and prominence at the MTP joint of the great toe.  Does not feel warm.  It is mildly tender.  He has decreased range of motion.    He has soft swelling of the olecranon bursa on the right elbow   Skin:     General: Skin is warm and dry.   Neurological:      General: No focal deficit present.      Mental Status: He is alert and oriented to person, place, and time. Mental status is at baseline.      Cranial Nerves: No cranial nerve deficit.      Motor: No weakness.   Psychiatric:         Mood and Affect: Mood normal.         Behavior: Behavior normal.         Thought Content: Thought content normal.         Judgment: Judgment normal.          Assessment:     1. Moderate mixed hyperlipidemia not requiring statin therapy  - Lipid panel    2. Benign prostatic hyperplasia, unspecified whether lower urinary tract symptoms present  - PSA    3. Impaired fasting blood sugar  - Comprehensive metabolic panel  - Hemoglobin A1C    4. Immunization due  - Flu vaccine HIGH-DOSE QUAD (PF) 65 yrs and older    5. B12 deficiency  - Vitamin B12  - CBC and differential    6. 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 Olecranon bursitis of right elbow      Plan:     We discussed the olecranon bursitis and have recommended the treatment at this time since is been there for quite a long time.  He can  apply heat intermittently but should avoid trauma and leaning on that elbow.  If it gets inflamed he needs to be seen urgently.  If it does not resolve and becomes painful he would likely need to see an orthopedist.  I discussed the possibility of aspiration but due to the duration I felt since this was mild it was best to leave it alone.    We discussed his toe pain and swelling and recommended that he get an x-ray we will check a uric acid level.    His flu shot is due and is given at today.  In addition lab work is ordered and we will contact him with the results when available.  He is moving within the next month and will need to find a new doctor at his new location.    Donley Redder, MD

## 2020-07-08 ENCOUNTER — Other Ambulatory Visit: Payer: Self-pay | Admitting: Family Medicine

## 2020-07-08 LAB — CBC AND DIFFERENTIAL
Baso(Absolute): 0 10*3/uL (ref 0.0–0.2)
Basos: 0 %
Eos: 2 %
Eosinophils Absolute: 0.1 10*3/uL (ref 0.0–0.4)
Hematocrit: 46.8 % (ref 37.5–51.0)
Hemoglobin: 15.4 g/dL (ref 13.0–17.7)
Immature Granulocytes Absolute: 0 10*3/uL (ref 0.0–0.1)
Immature Granulocytes: 0 %
Lymphocytes Absolute: 1.1 10*3/uL (ref 0.7–3.1)
Lymphocytes: 23 %
MCH: 31.9 pg (ref 26.6–33.0)
MCHC: 32.9 g/dL (ref 31.5–35.7)
MCV: 97 fL (ref 79–97)
Monocytes Absolute: 0.5 10*3/uL (ref 0.1–0.9)
Monocytes: 10 %
Neutrophils Absolute: 3.1 10*3/uL (ref 1.4–7.0)
Neutrophils: 65 %
Platelets: 200 10*3/uL (ref 150–450)
RBC: 4.83 x10E6/uL (ref 4.14–5.80)
RDW: 12.8 % (ref 11.6–15.4)
WBC: 4.8 10*3/uL (ref 3.4–10.8)

## 2020-07-08 LAB — COMPREHENSIVE METABOLIC PANEL
ALT: 26 IU/L (ref 0–44)
AST (SGOT): 24 IU/L (ref 0–40)
African American eGFR: 100 mL/min/{1.73_m2} (ref >59)
Albumin/Globulin Ratio: 1.8 (ref 1.2–2.2)
Albumin: 4.4 g/dL (ref 3.7–4.7)
Alkaline Phosphatase: 71 IU/L (ref 44–121)
BUN / Creatinine Ratio: 17 (ref 10–24)
BUN: 13 mg/dL (ref 8–27)
Bilirubin, Total: 1 mg/dL (ref 0.0–1.2)
CO2: 27 mmol/L (ref 20–29)
Calcium: 9.3 mg/dL (ref 8.6–10.2)
Chloride: 102 mmol/L (ref 96–106)
Creatinine: 0.77 mg/dL (ref 0.76–1.27)
Globulin, Total: 2.4 g/dL (ref 1.5–4.5)
Glucose: 101 mg/dL — ABNORMAL HIGH (ref 65–99)
Potassium: 5.1 mmol/L (ref 3.5–5.2)
Protein, Total: 6.8 g/dL (ref 6.0–8.5)
Sodium: 141 mmol/L (ref 134–144)
non-African American eGFR: 86 mL/min/{1.73_m2} (ref >59)

## 2020-07-08 LAB — LIPID PANEL
Cholesterol / HDL Ratio: 3.6 ratio (ref 0.0–5.0)
Cholesterol: 168 mg/dL (ref 100–199)
HDL: 47 mg/dL (ref >39)
LDL Chol Calculated (NIH): 102 mg/dL — ABNORMAL HIGH (ref 0–99)
Triglycerides: 101 mg/dL (ref 0–149)
VLDL Calculated: 19 mg/dL (ref 5–40)

## 2020-07-08 LAB — HEMOGLOBIN A1C: Hemoglobin A1C: 6.1 % — ABNORMAL HIGH (ref 4.8–5.6)

## 2020-07-08 LAB — VITAMIN B12: Vitamin B-12: 656 pg/mL (ref 232–1245)

## 2020-07-08 LAB — URIC ACID: Uric acid: 5.2 mg/dL (ref 3.8–8.4)

## 2020-07-08 LAB — PSA: Prostate Specific Antigen, Total: 3.8 ng/mL (ref 0.0–4.0)

## 2020-07-09 ENCOUNTER — Other Ambulatory Visit (INDEPENDENT_AMBULATORY_CARE_PROVIDER_SITE_OTHER): Payer: Self-pay | Admitting: Family Medicine

## 2020-07-09 DIAGNOSIS — E7800 Pure hypercholesterolemia, unspecified: Secondary | ICD-10-CM

## 2020-07-09 NOTE — Telephone Encounter (Signed)
Last seen in 06/2020 , last renewed in 06/2020 as 30 days only.

## 2021-04-16 ENCOUNTER — Other Ambulatory Visit (INDEPENDENT_AMBULATORY_CARE_PROVIDER_SITE_OTHER): Payer: Self-pay | Admitting: Family Medicine

## 2021-04-16 DIAGNOSIS — E7800 Pure hypercholesterolemia, unspecified: Secondary | ICD-10-CM

## 2021-04-17 ENCOUNTER — Other Ambulatory Visit (INDEPENDENT_AMBULATORY_CARE_PROVIDER_SITE_OTHER): Payer: Self-pay | Admitting: Family Medicine

## 2021-04-17 DIAGNOSIS — E7800 Pure hypercholesterolemia, unspecified: Secondary | ICD-10-CM

## 2021-04-19 NOTE — Telephone Encounter (Signed)
Per OV note from Dr. Adriana Mccallum on 07/07/20, "He is moving within the next month and will need to find a new doctor at his new location."

## 2021-04-19 NOTE — Telephone Encounter (Signed)
For review

## 2021-09-08 ENCOUNTER — Encounter (INDEPENDENT_AMBULATORY_CARE_PROVIDER_SITE_OTHER): Payer: Self-pay

## 2022-02-22 IMAGING — DX HIP 2 VIEWS LEFT WITH PELVIS
3 series · 3 of 3 positions shown · non-contrast
Comparison: None.

________________________________________________________________________________________________ 
HIP 2 VIEWS LEFT WITH PELVIS, 02/22/2022 [DATE]: 
CLINICAL INDICATION: Left hip pain, Pain in joint multiple sites.

[AP (1 of 2)]
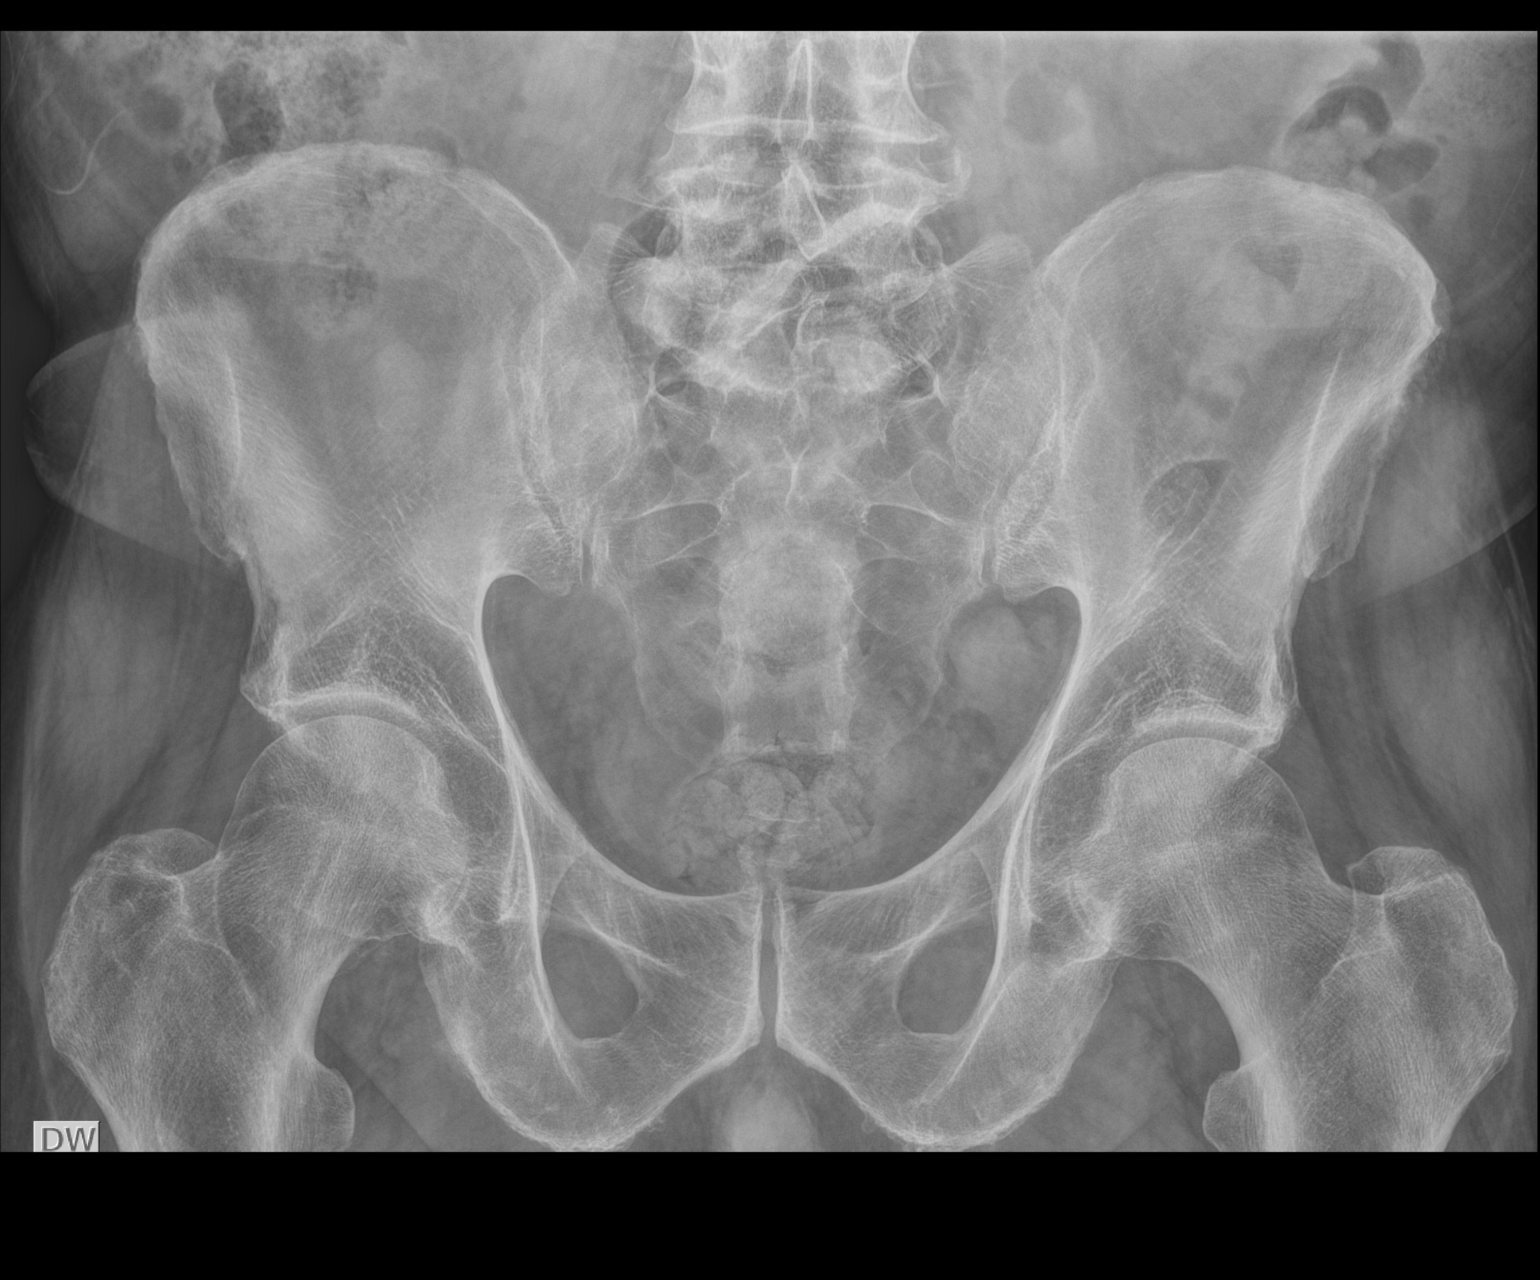

[AP (2 of 2)]
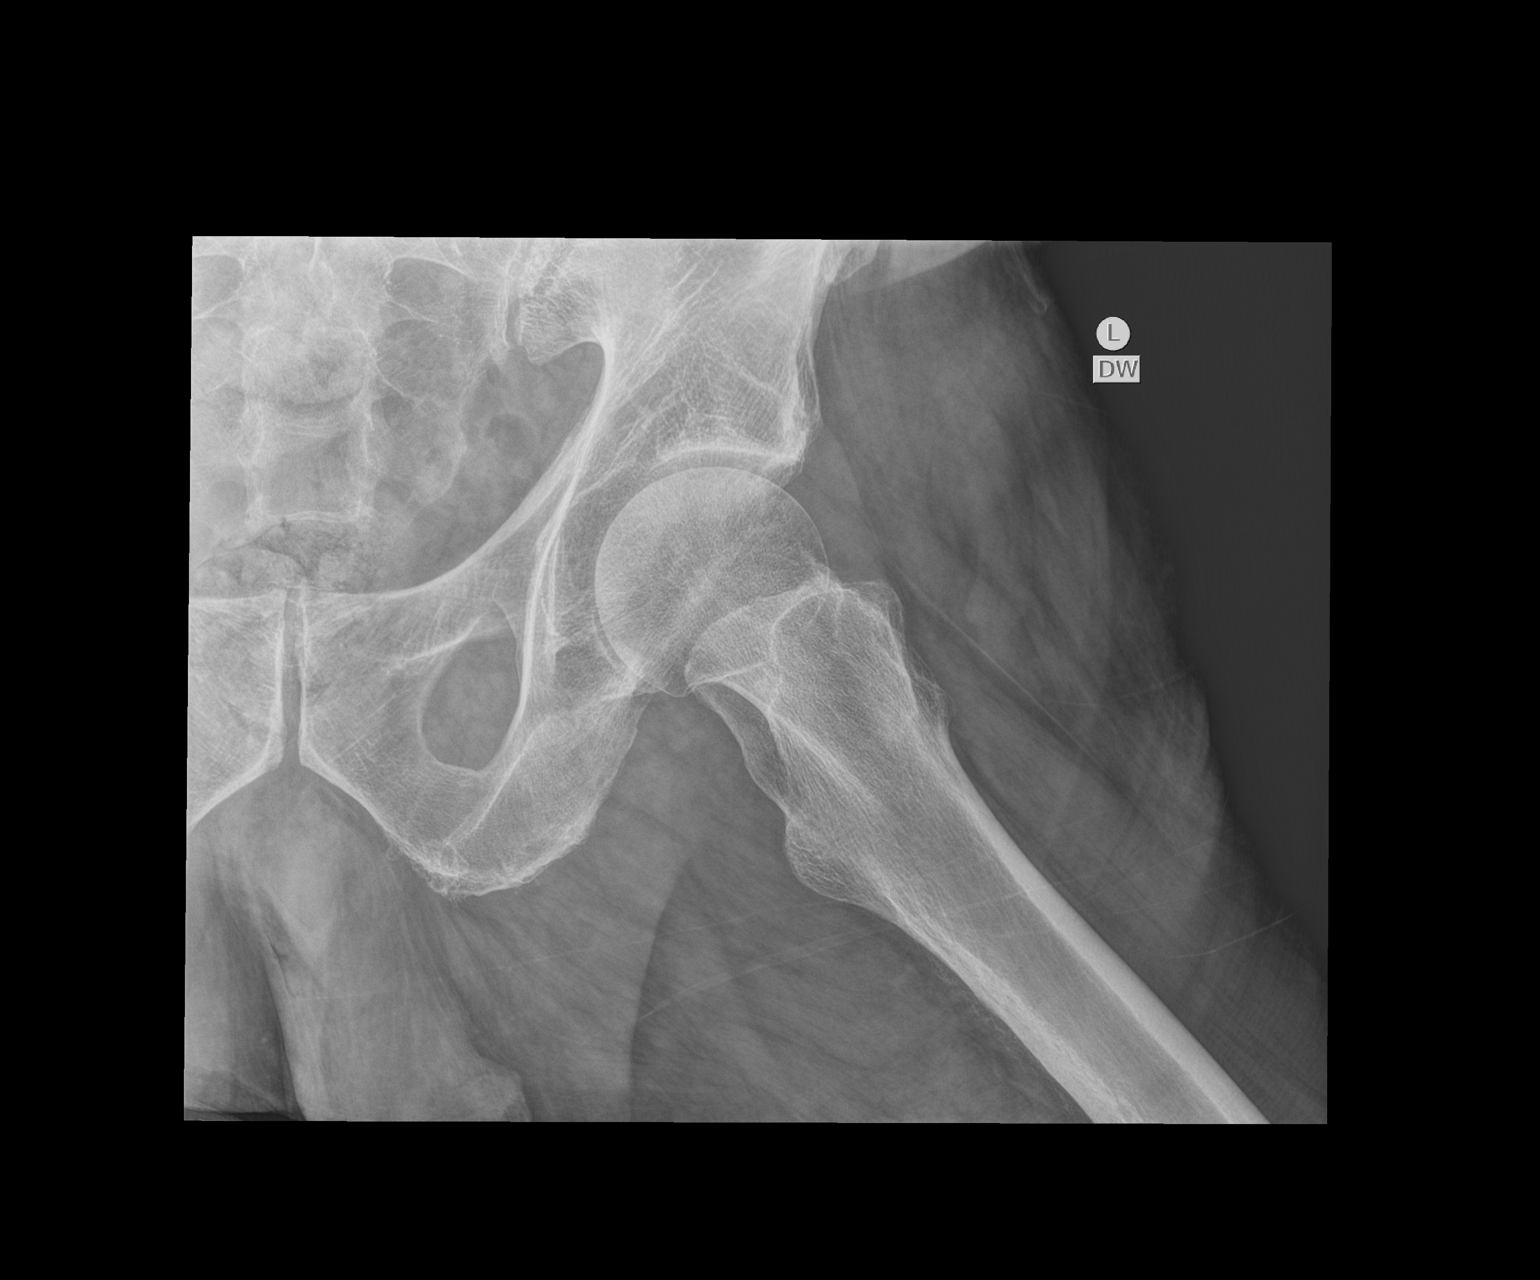

[lateral]
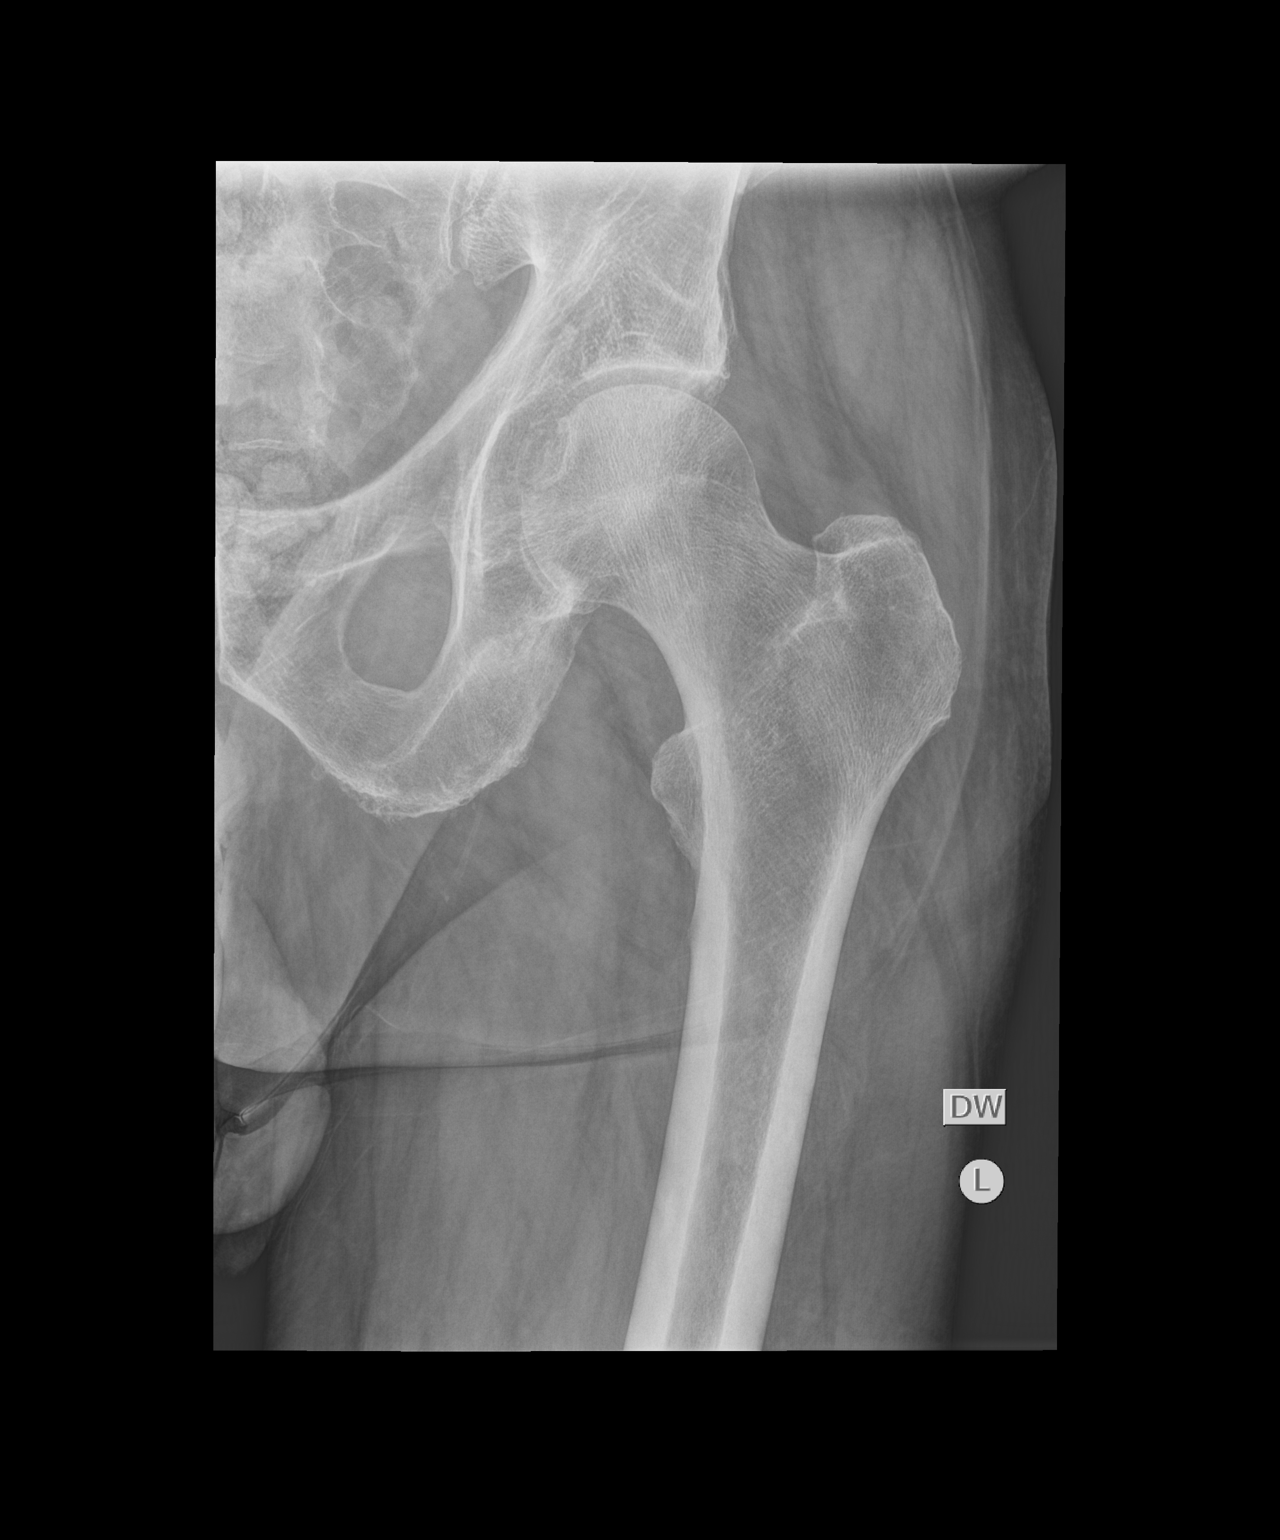

[3 of 3 positions shown; findings below may reference images not displayed]

FINDINGS: No fracture. Normal alignment. Hip joint spaces are preserved. 
Included portions of the lower lumbar spine and SI joints appear negative.
IMPRESSION: Negative exam. If symptoms persist, consideration could be made for MR exam.

## 2022-02-22 IMAGING — DX LUMBAR SPINE COMPLETE 4 VIEWS
4 series · 4 of 4 positions shown · non-contrast
Comparison: None

________________________________________________________________________________________________ 
LUMBAR SPINE COMPLETE 4 VIEWS, 02/22/2022 [DATE]: 
CLINICAL INDICATION: Left hip pain, Pain in joint multiple sites. Low back and 
left hip pain. No known trauma.

[AP]
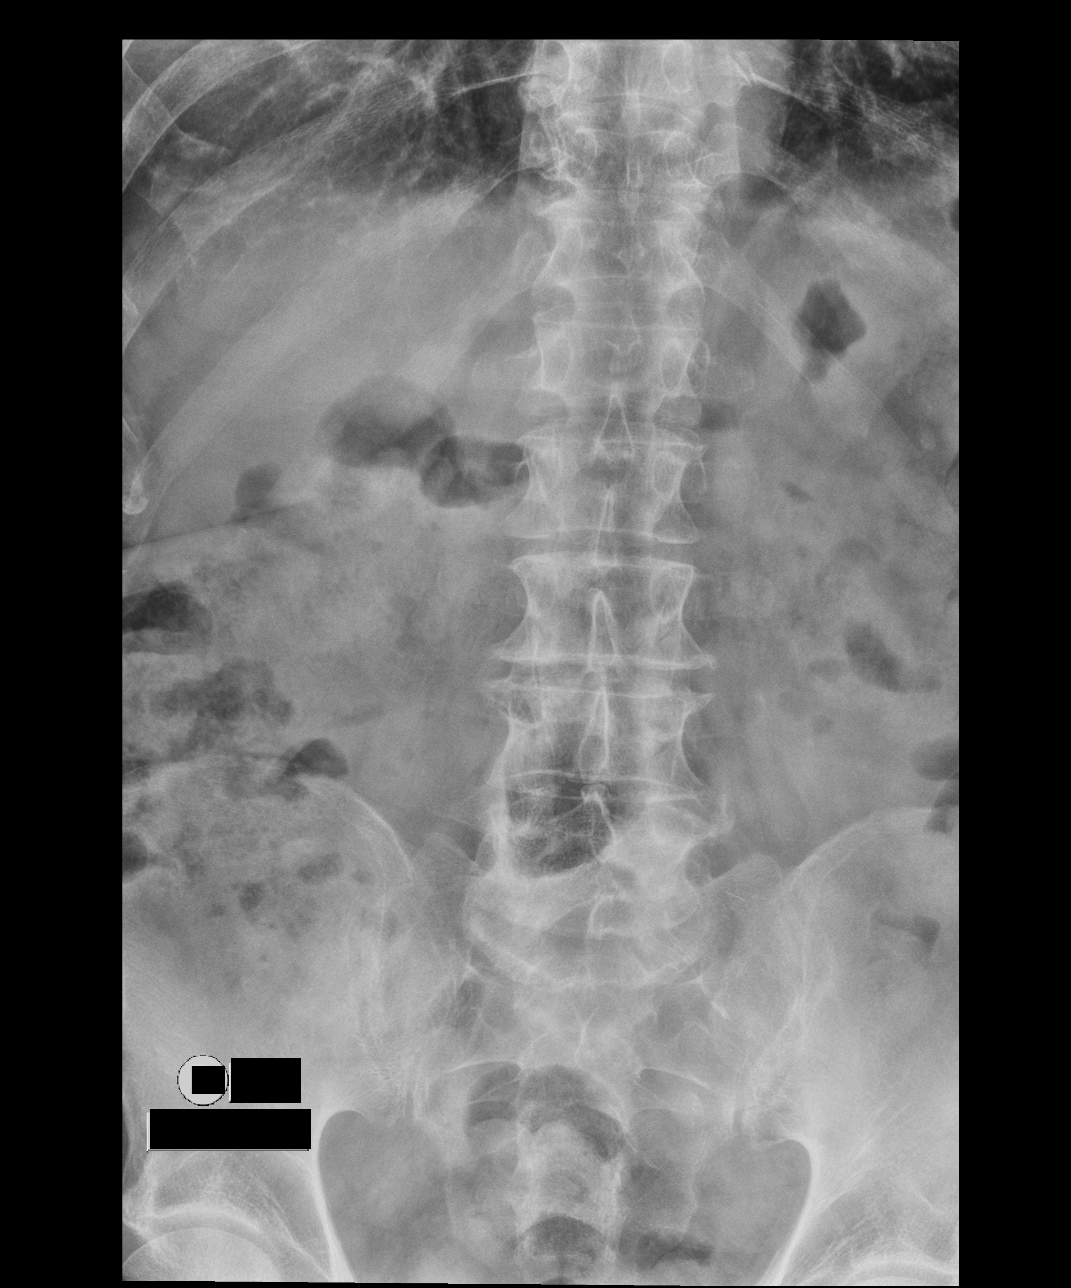

[oblique (1 of 2)]
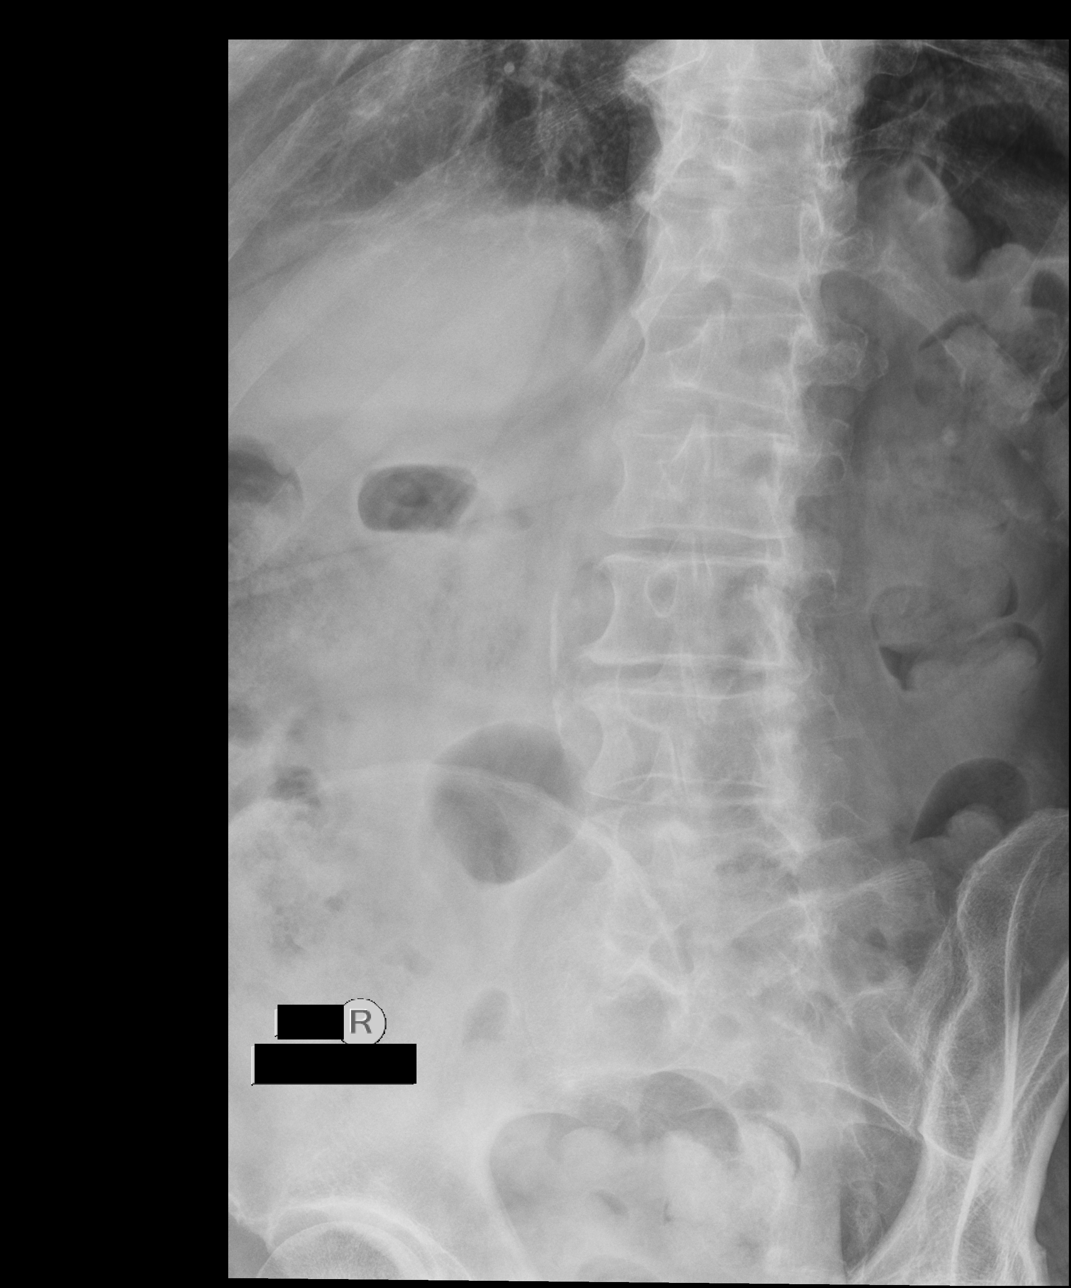

[oblique (2 of 2)]
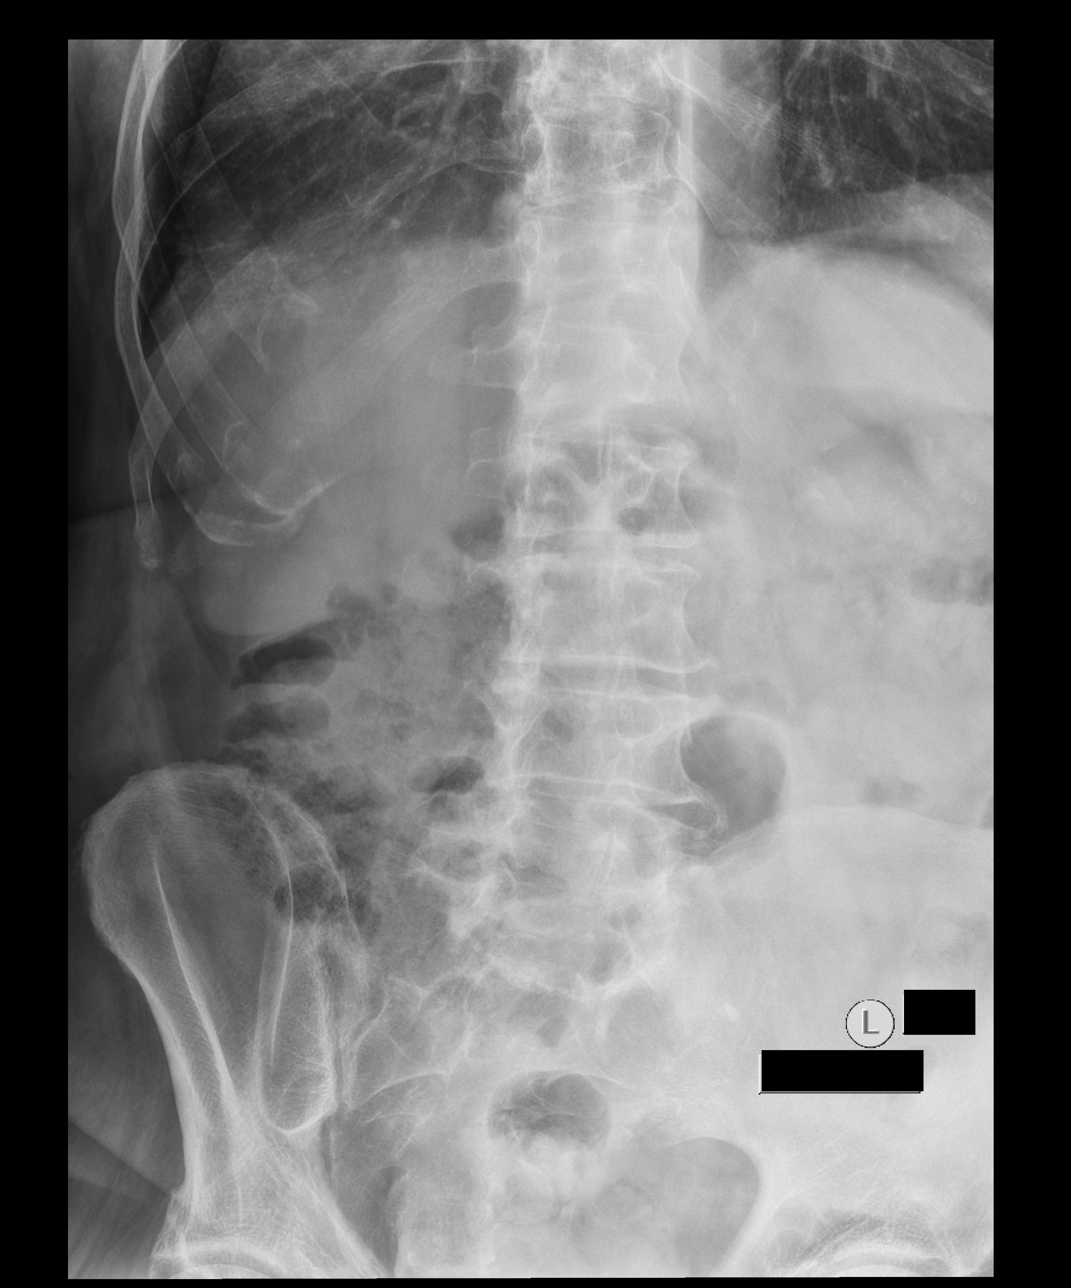

[lateral]
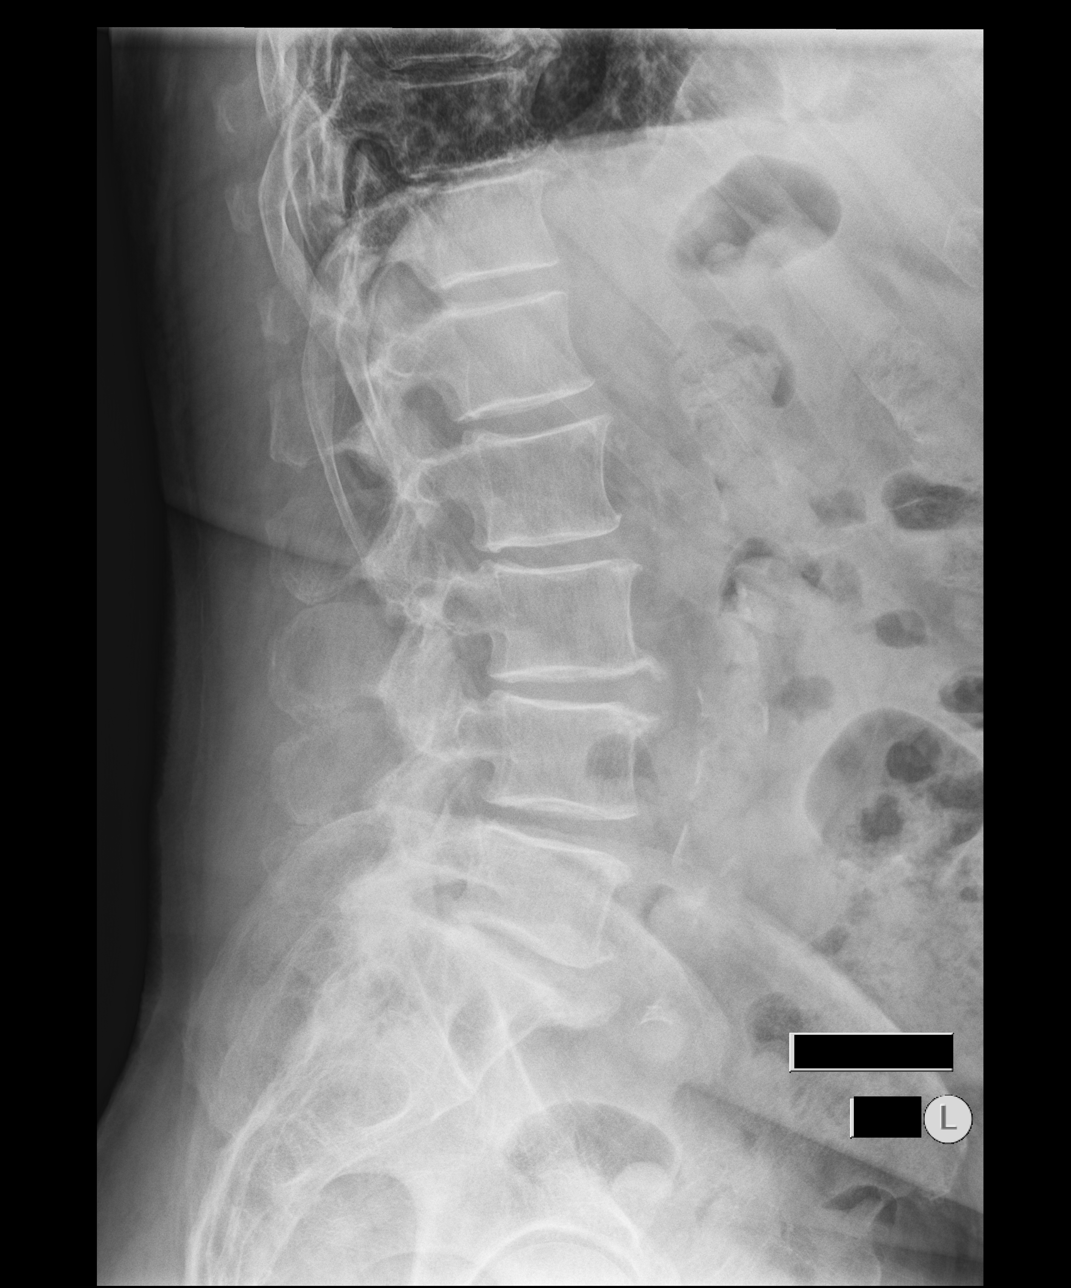

[4 of 4 positions shown; findings below may reference images not displayed]

FINDINGS: 5 lumbar type vertebral bodies. Anatomic sagittal and coronal 
alignment. Vertebral body height preserved without fracture. Mild degrees of 
loss of disc height, endplate sclerosis, and marginal osteophyte formation 
indicative of degenerative change. Mild L5-S1 facet arthropathy. No pars defect. 
Mild atherosclerotic change. Pelvic ring intact. Unremarkable bowel gas pattern.
IMPRESSION: Mild lumbar degenerative changes.

## 2022-04-26 IMAGING — MR MRI LUMBAR SPINE WITHOUT CONTRAST
4 of 8 series · 13 of 48 positions shown · IV contrast (gadolinium)
Comparison: Lumbar spine x-ray February 22, 2022.

________________________________________________________________________________________________ 
MRI LUMBAR SPINE WITHOUT CONTRAST, 04/26/2022 [DATE]: 
CLINICAL INDICATION: Lumbar radiculopathy. Left buttock pain. No radiation.
TECHNIQUE: Multiplanar, multiecho position MR images of the lumbar spine were 
performed without intravenous gadolinium enhancement. Patient was scanned on a 
1.5T magnet.

[Series 101: survey · axial · 10.0mm · 1.39mm/px · 1 of 10 slices shown]
[im 1/10]
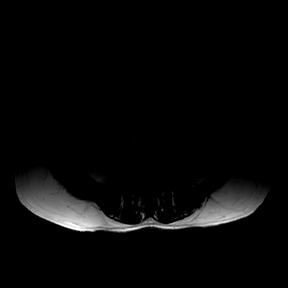

[Series 201: t2w_cor-surv · coronal · 6.0mm · 0.60mm/px · 1 of 11 slices shown]
[im 1/11]
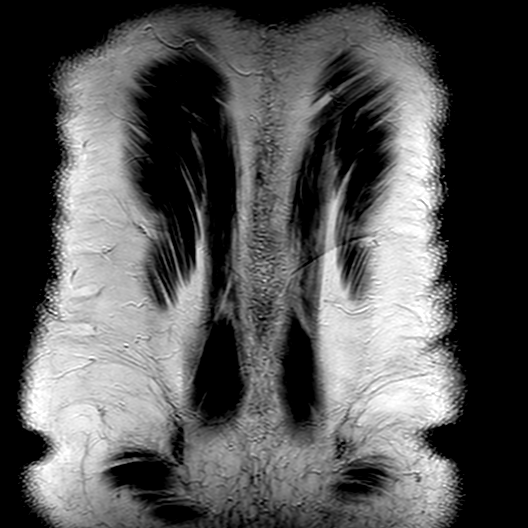

[Series 301: t1_tse_sag · sagittal · 4.0mm · 0.43mm/px · 2 of 19 slices shown]
[im 1/19]
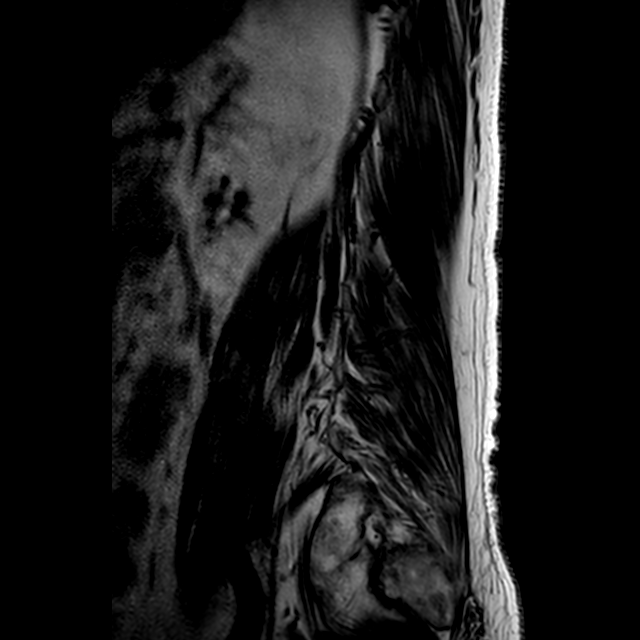
[im 19/19]
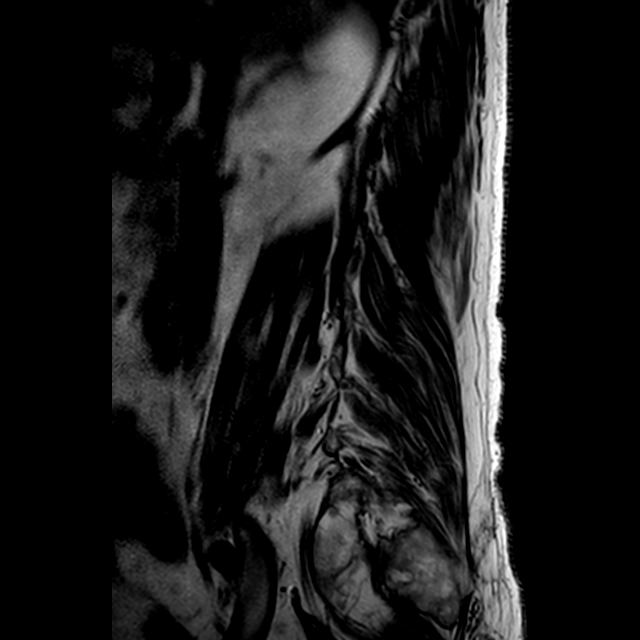

[Series 401: 3d_spine_view_t2w · sagittal · 1.4mm · 0.44mm/px · 9 of 125 slices shown]
[im 8/125]
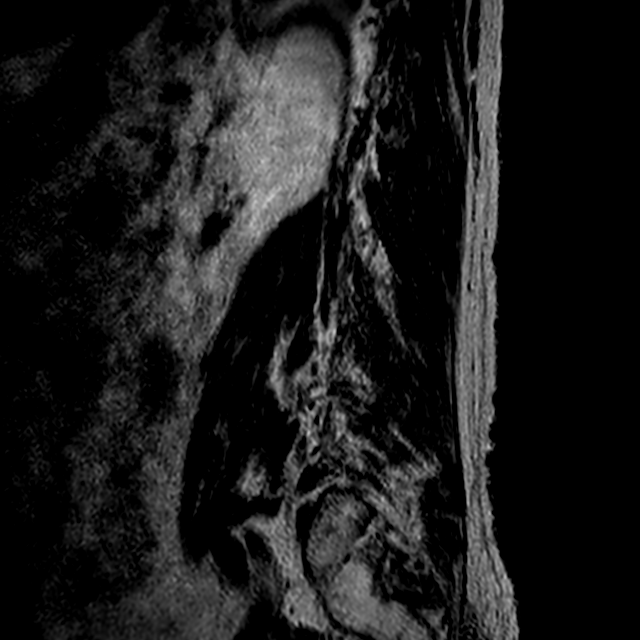
[im 16/125]
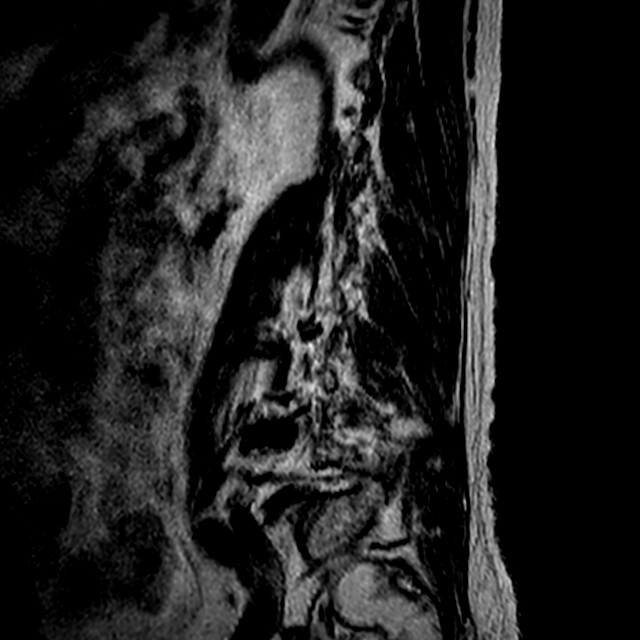
[im 24/125]
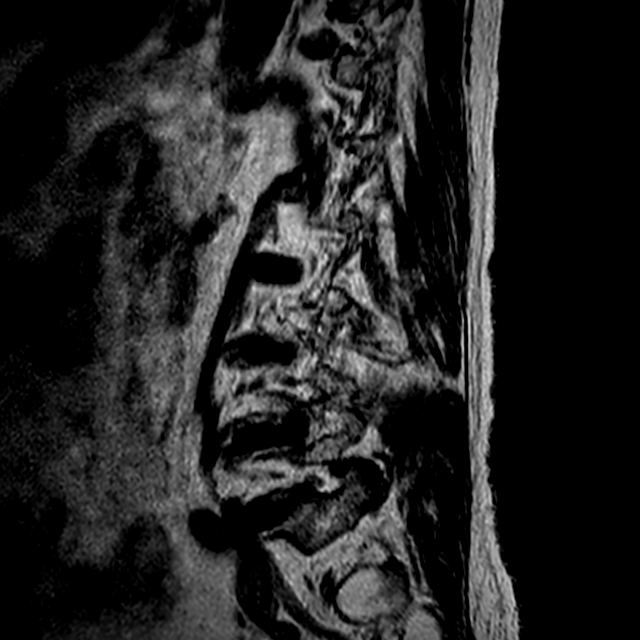
[im 39/125]
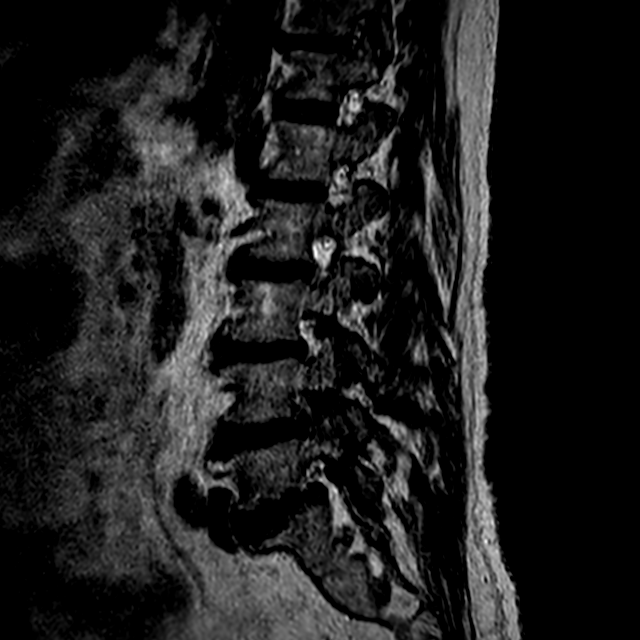
[im 55/125]
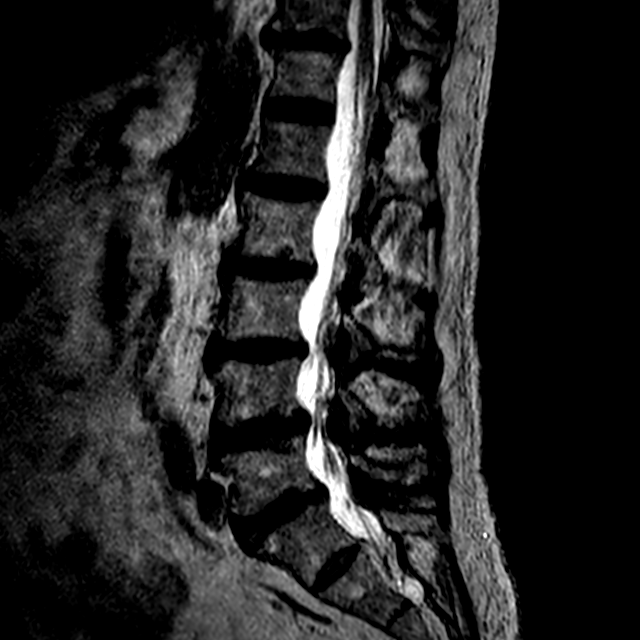
[im 63/125]
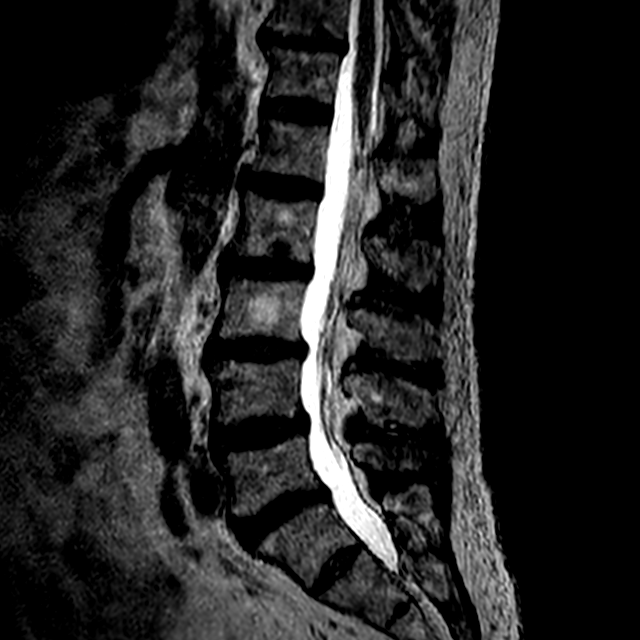
[im 70/125]
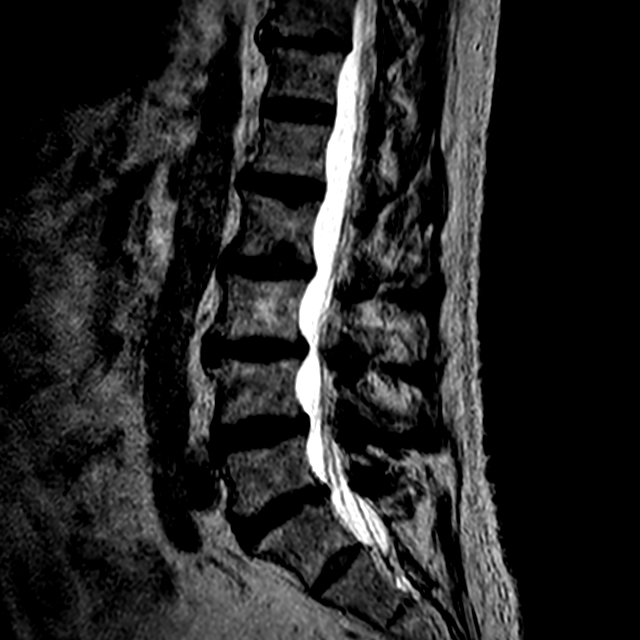
[im 86/125]
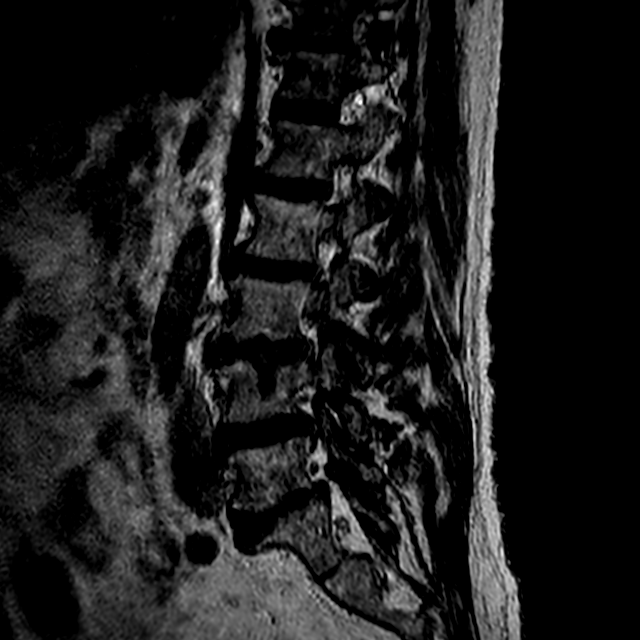
[im 109/125]
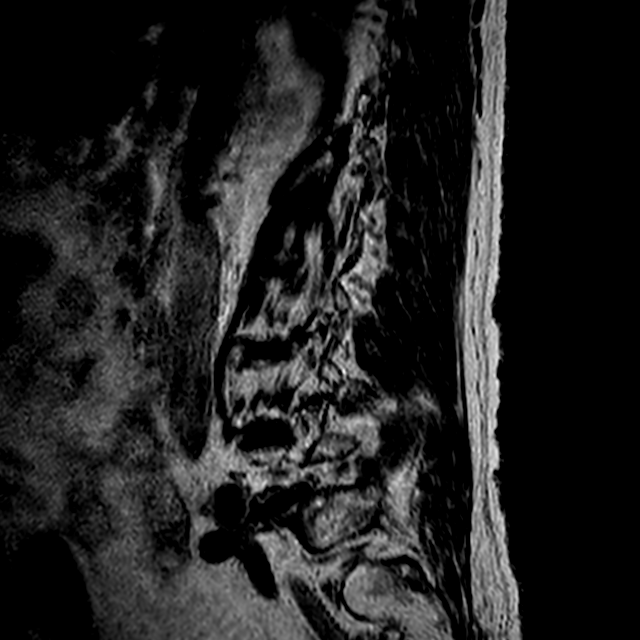

[13 of 48 positions shown; findings below may reference images not displayed]

FINDINGS: -------------------------------------------------------------------------------- 
------ 
GENERAL: 
Transitional anatomy. S1 segment is lumbarized with a rudimentary disc at S1-S2. 

ALIGNMENT: Slight loss of the normal lumbar lordosis. Otherwise normal. 
VERTEBRAL BODY HEIGHT: Normal.  
MARROW SIGNAL: No focal suspect signal abnormality. There are a few vertebral 
body hemangiomas. 
CORD SIGNAL: Normal distal spinal cord and cauda equina. Conus medullaris 
terminates at L1. 
ADDITIONAL FINDINGS: Right renal cyst measures 4.1 cm and is exophytic off the 
lateral aspect.. Tarlov cyst noted. 
Modic I-II: L3-L4 
Ligamentum Flavum > 2.5 mm: All levels. 
-------------------------------------------------------------------------------- 
------ 
SEGMENTAL: 
T11-T12: Mild to moderate loss of disc height. Schmorls node. Anterior marginal 
osteophytes, minimal annular bulge. Canal and foramina are patent. 
T12-L1: Loss of disc signal. Otherwise normal. 
L1-L2: Loss of disc signal. Minimal bilateral facet joint effusions. Canal and 
foramina are patent. 
L2-L3: Mild loss of disc height with loss of disc signal and Schmorls node 
formation. Minimal annular bulge. Borderline canal stenosis. Facet arthropathy 
with small bilateral facet joint effusions. Foramina patent. 
L3-L4: Mild loss of disc height with loss of disc signal. Schmorls node. 
Anterior marginal osteophytes. Mild annular bulge with mild canal stenosis and 
lateral recess narrowing bilaterally, slightly worse on the right. Mild facet 
arthropathy with small bilateral facet joint effusions. Mild right foraminal 
narrowing. Left foramen is patent. 
L4-L5: Loss of disc signal. Annular bulge is eccentric to the left with slight 
narrowing of the left lateral recess and evidence of a left foraminal small size 
disc extrusion, but this is creating moderate to moderately severe left 
foraminal narrowing and could be affecting the exiting left L4 and/or traversing 
left L5 nerve roots. The right foramen is patent. Mild facet arthropathy. 
L5-S1: Mild loss of disc height with loss of disc signal. Minimal annular bulge. 
Canal patent. Right foramen patent. Left foramen is mildly narrowed. Facet 
arthropathy. 
S1-S2: Rudimentary disc with patent canal and foramina. 
-------------------------------------------------------------------------------- 
------
IMPRESSION: Motion artifact slightly degrades several of the sequences. 
Transitional anatomy. S1 segment is lumbarized. Consider radiographic guidance 
if future intervention is entertained. 
L4-L5, annular bulge is eccentric to the left with slight narrowing of the left 
lateral recess and evidence of a left foraminal small size disc extrusion, 
however this is creating moderate to moderately severe left foraminal narrowing 
and could be affecting the exiting left L4 and/or traversing left L5 nerve root. 
Correlate clinically. 
Less significant degenerative changes detailed above.

## 2022-08-11 IMAGING — MR MRI THORACIC SPINE WITHOUT CONTRAST
4 of 9 series · 16 of 48 positions shown · non-contrast
Comparison: None.

________________________________________________________________________________________________ 
MRI THORACIC SPINE WITHOUT CONTRAST, 08/11/2022 [DATE]: 
CLINICAL INDICATION: Unspecified abnormalities of gait and motility.
TECHNIQUE: Multiplanar, multiecho position MR images of the thoracic spine were 
performed without contrast. Patient was scanned on a 1.5T magnet.

[Series 201: t2_sag_count · sagittal · 4.0mm · 0.62mm/px · 4 of 22 slices shown]
[im 1/22]
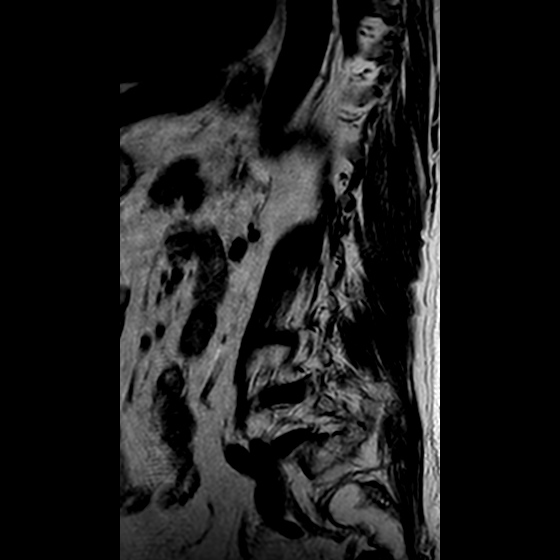
[im 8/22]
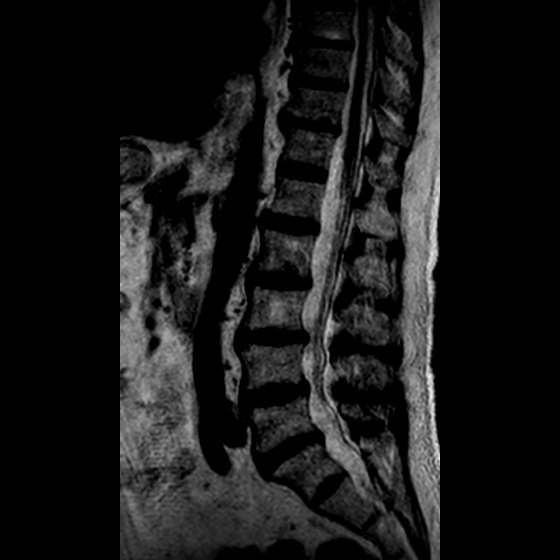
[im 15/22]
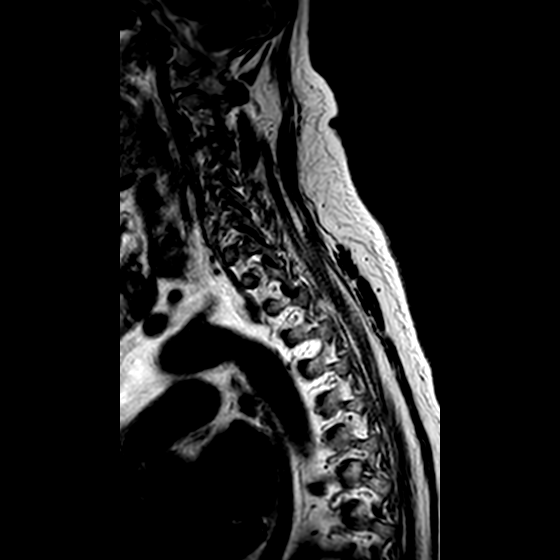
[im 22/22]
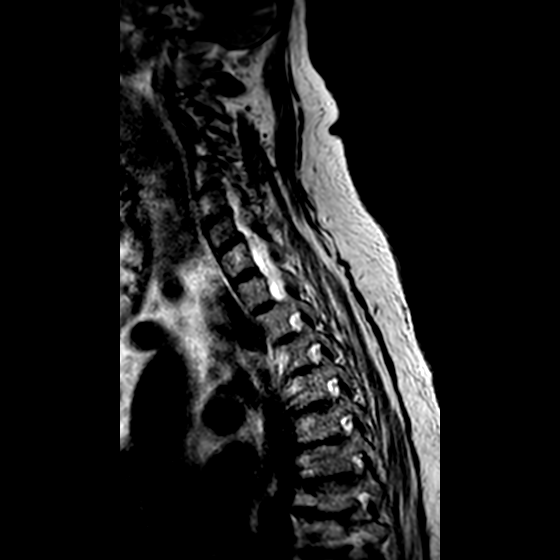

[Series 202: T2 · sagittal · 4.0mm · 0.62mm/px · 2 of 11 slices shown]
[im 1/11]
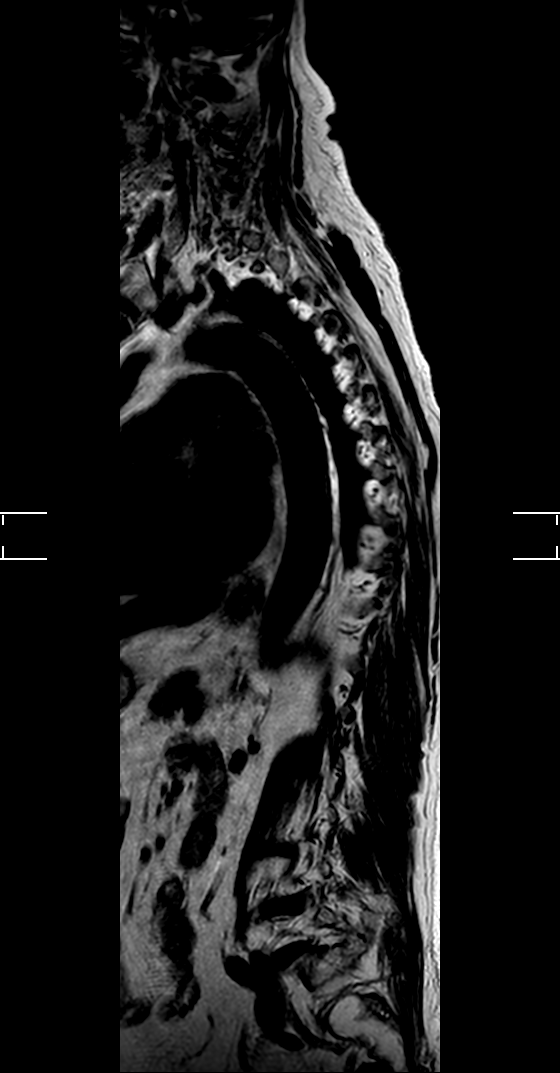
[im 11/11]
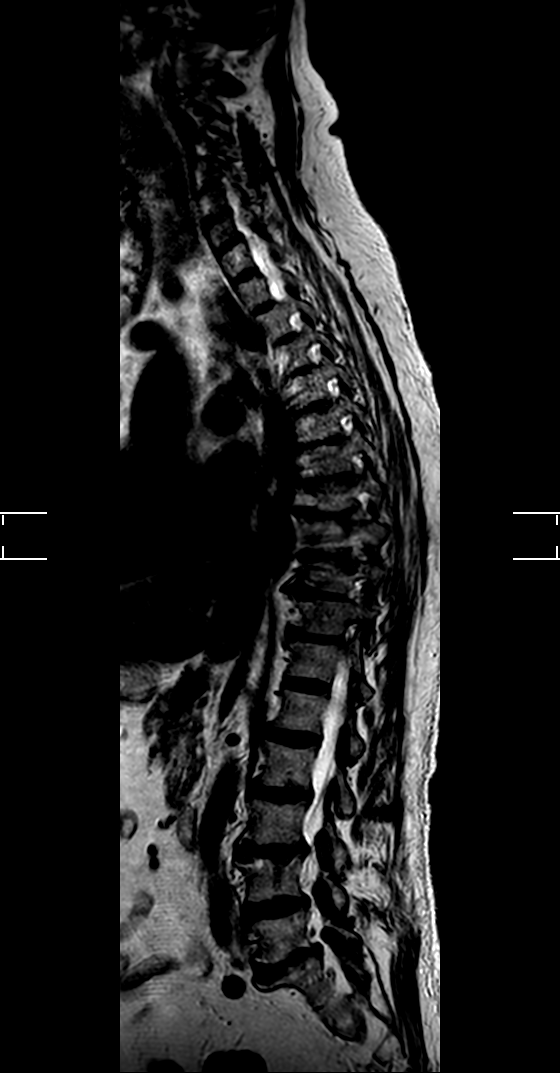

[Series 301: t2_cor_count · coronal · 4.0mm · 0.59mm/px · 2 of 15 slices shown]
[im 1/15]
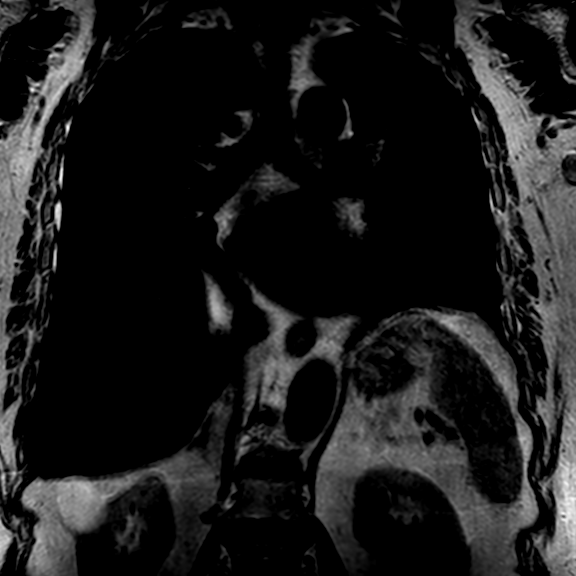
[im 15/15]
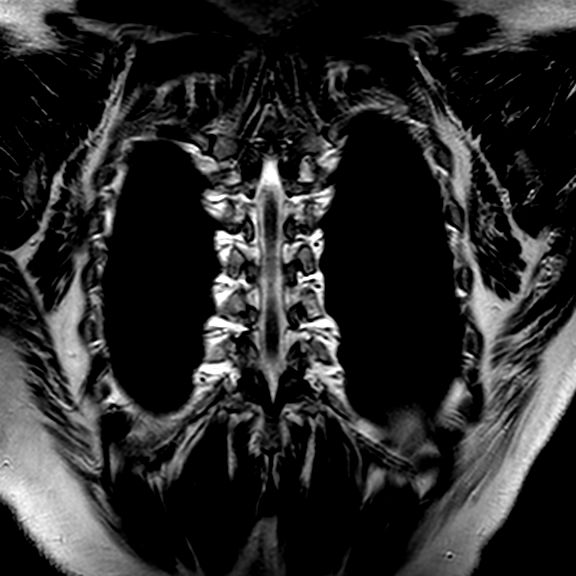

[Series 501: t2_(person_name)_2 stacks · axial · 4.0mm · 0.42mm/px · z∈[+59,+288]mm · 8 of 71 slices shown]
[im 1/71]
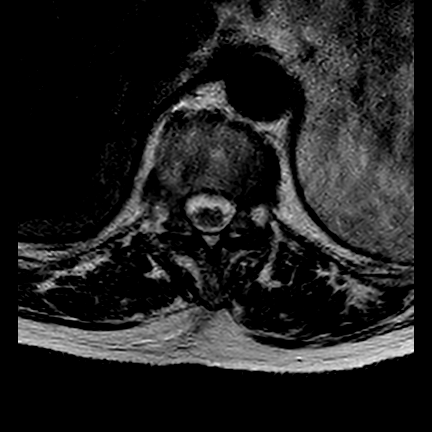
[im 8/71]
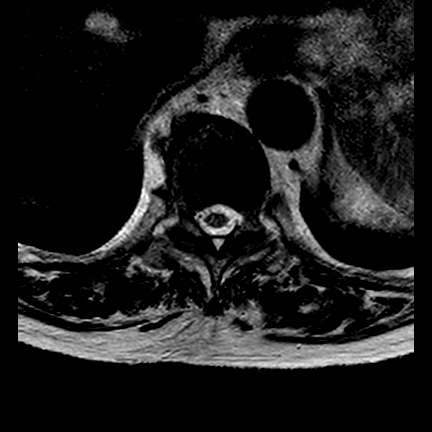
[im 24/71]
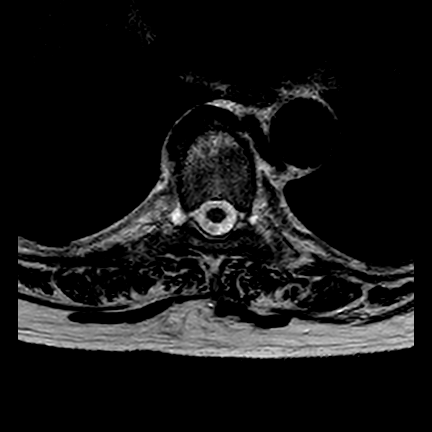
[im 32/71]
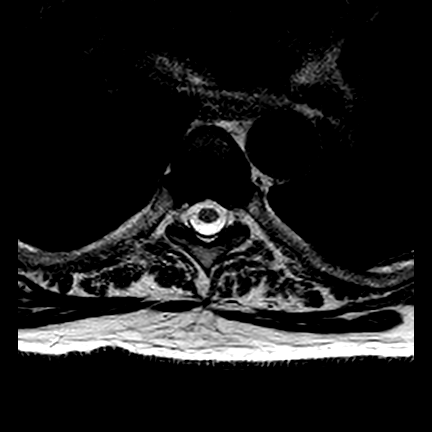
[im 39/71]
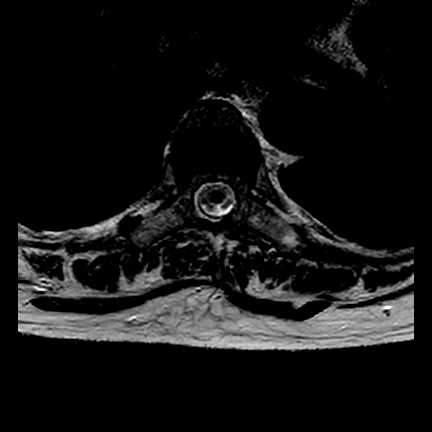
[im 47/71]
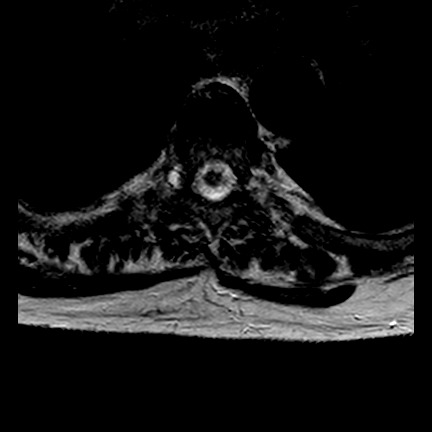
[im 63/71]
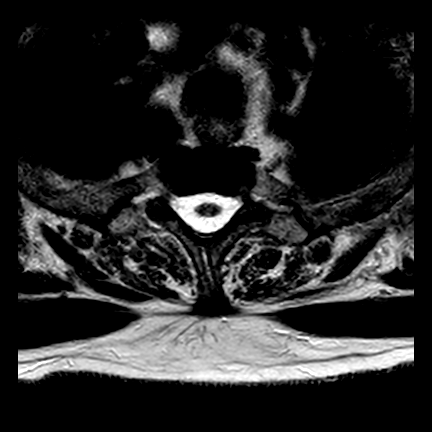
[im 71/71]
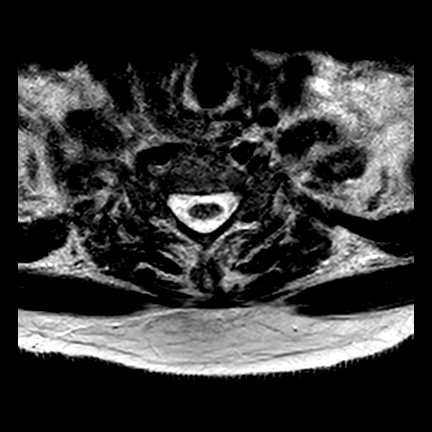

[16 of 48 positions shown; findings below may reference images not displayed]

FINDINGS: VERTEBRA: No fractures or marrow replacing lesions. Multilevel marginal 
osteophytes and Schmorl nodes. Minimal type I Modic changes at T7-8. No central 
canal or neural foraminal stenosis.  
DISCS: Multilevel degenerative disc disease. Small T5-6 left paracentral disc 
protrusion. Disc bulges at T3-4, T6-7 and T11-12. Perineural cysts.  
SPINAL CORD: Spinal cord is normal in caliber and signal intensity. Conus 
medullaris is at the level of L1.  
OTHER: Localizer views demonstrate degenerative change and stenosis of the 
cervical and lumbar spine: Please refer to 04/26/2022 MRI lumbar spine dictation.
IMPRESSION: 1.  Multifocal degenerative change and small T5-6 left paracentral disc 
protrusion.  
2.  No thoracic spinal stenosis.

## 2023-02-10 IMAGING — MR MRI PELVIS W/WO CONTRAST
4 of 9 series · 8 of 48 positions shown · IV contrast (gadavist)
Comparison: Radiograph since 02/22/2022

________________________________________________________________________________________________ 
MRI PELVIS W/WO CONTRAST, 02/10/2023 [DATE]: 
CLINICAL INDICATION: Unresolved chronic low back pain unspecified abnormalities 
of the immobility.
TECHNIQUE: Multiplanar, multiecho position MR images of the pelvis were 
performed without and with 11 mL of  Gadavist were injected intravenously by 
hand. 4 mL was discarded.. Patient was scanned on a 1.5T magnet.

[Series 201: survey · axial · 10.0mm · 1.25mm/px · 1 of 11 slices shown]
[im 1/11]
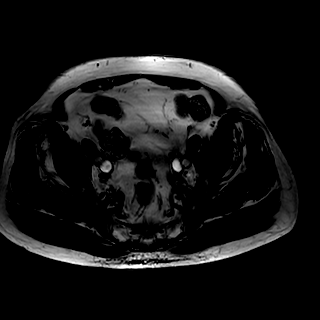

[Series 301: t1_(person_name) · axial · 6.0mm · 0.39mm/px · z∈[-196,+84]mm · 3 of 36 slices shown]
[im 1/36]
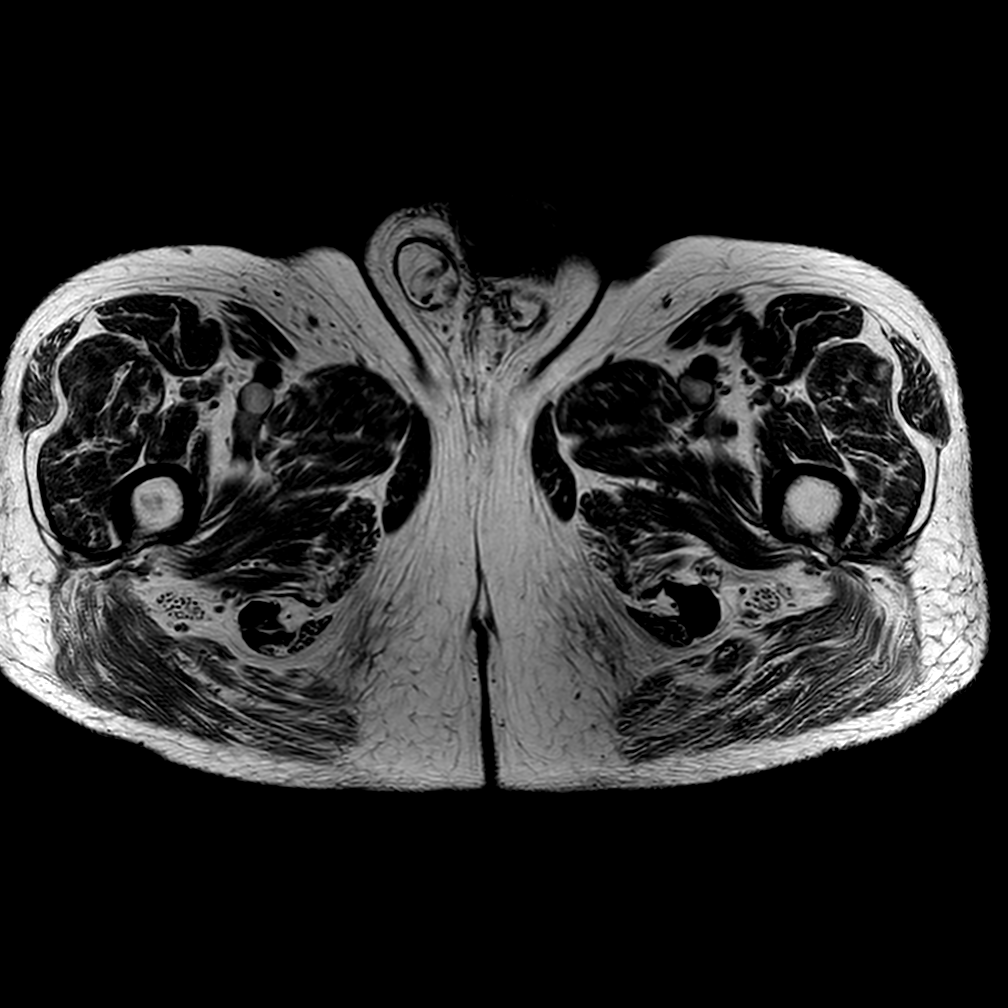
[im 24/36]
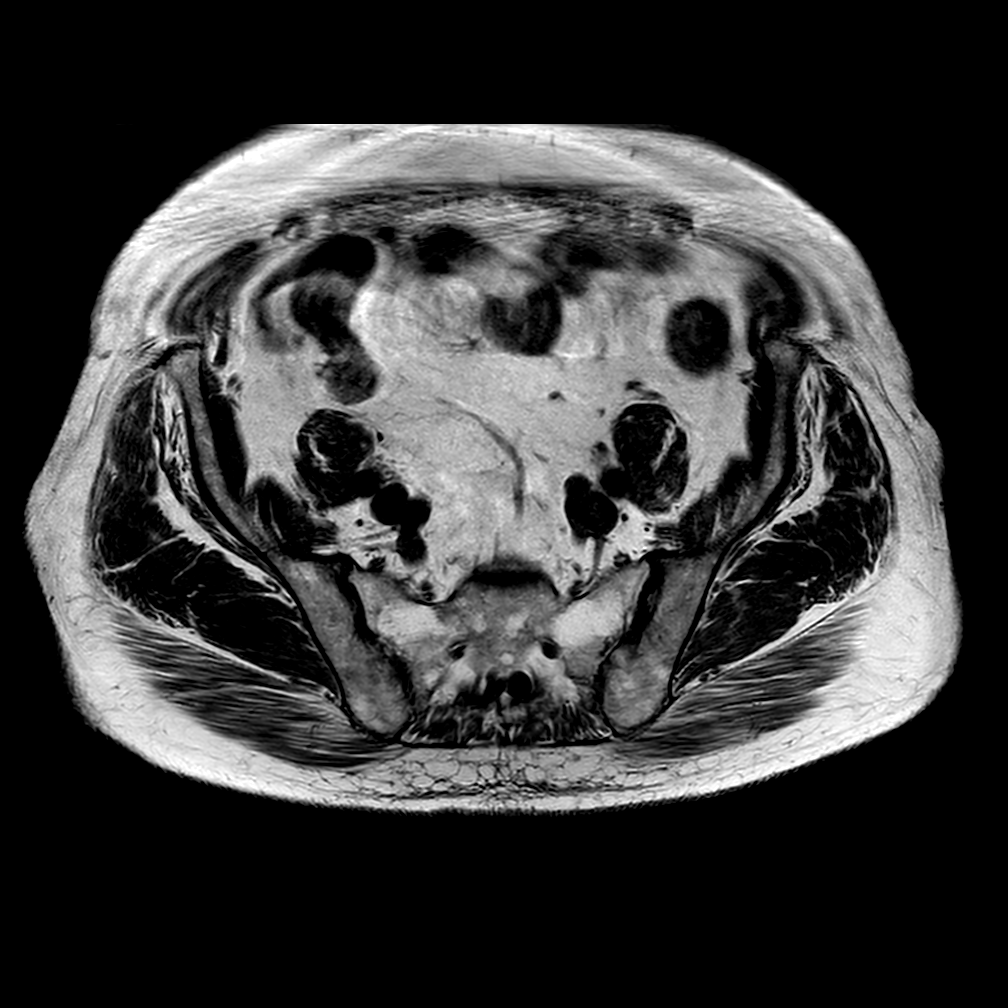
[im 36/36]
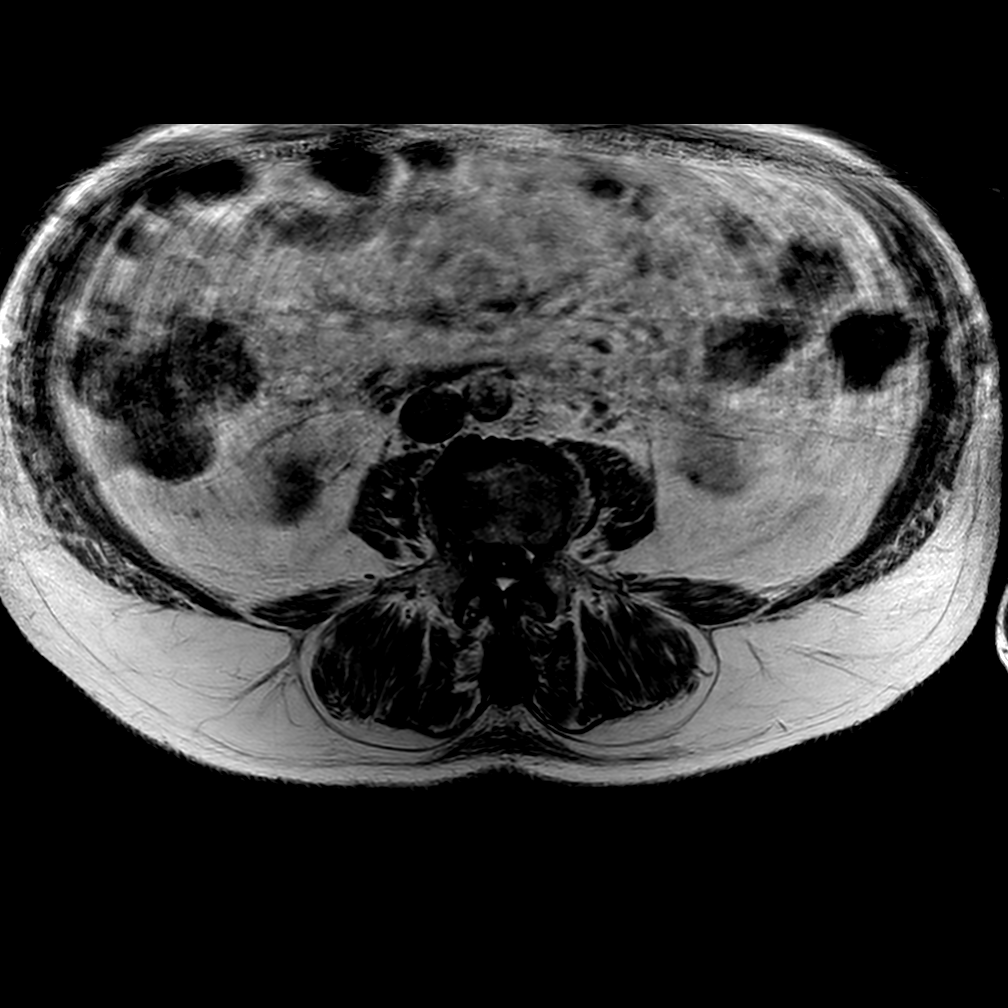

[Series 401: (person_name)_(person_name)_(person_name) · axial · 6.0mm · 0.59mm/px · z∈[-196,+84]mm · 3 of 36 slices shown]
[im 1/36]
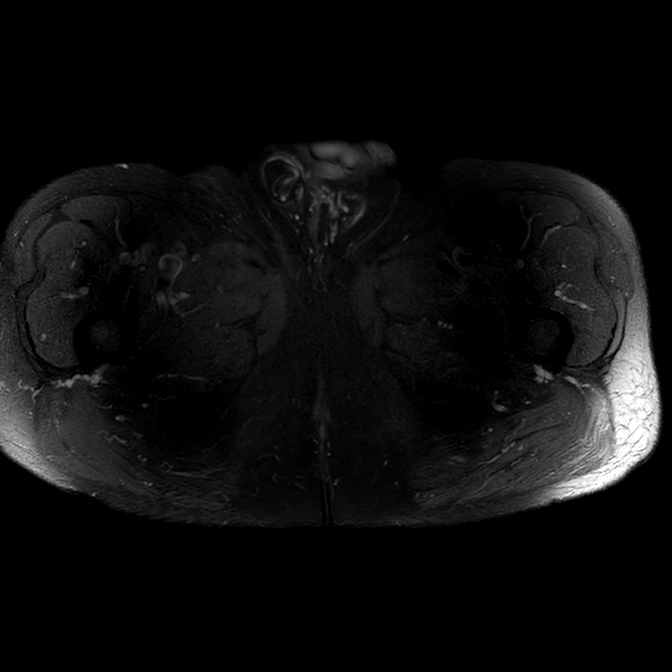
[im 24/36]
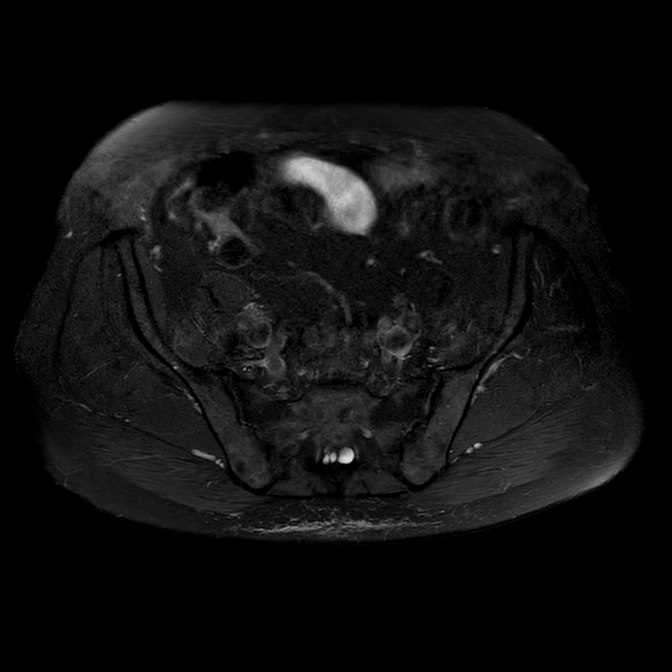
[im 36/36]
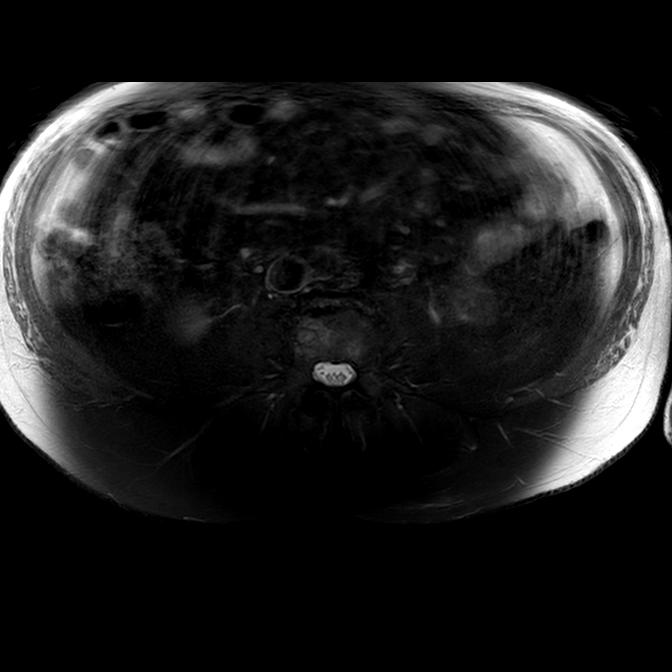

[Series 501: t1_cor · coronal · 5.0mm · 0.56mm/px · 1 of 44 slices shown]
[im 9/44]
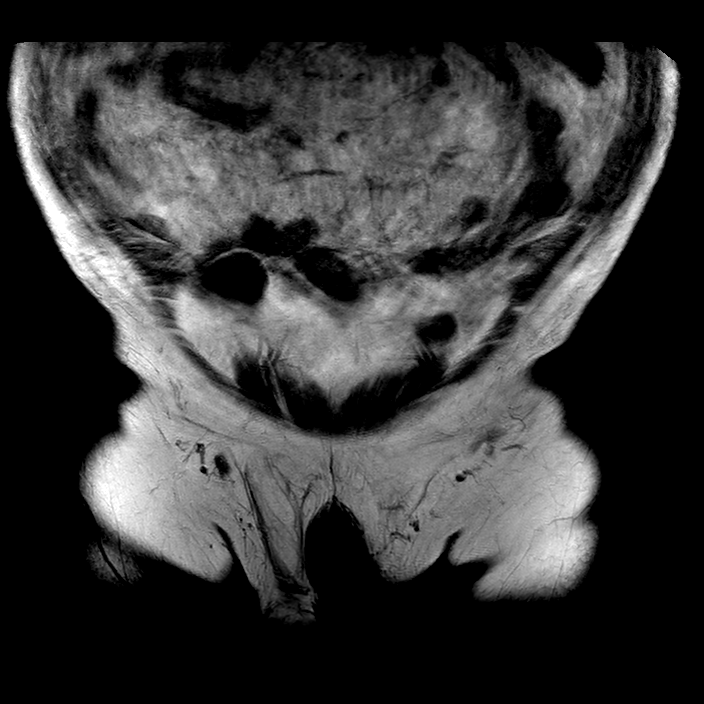

[8 of 48 positions shown; findings below may reference images not displayed]

FINDINGS: BONES/JOINTS: There is moderate articular cartilaginous loss bilaterally. There 
is avascular necrosis involving the anterior right femoral head involving 
approximately 25% of the articular surface. No subarticular collapse. No left 
femoral head avascular necrosis. No prominent hip effusion. No abnormal 
morphology of the proximal femurs or acetabulum to predispose to impingement. 
Multifocal degenerative changes included lower lumbar spine. Moderate spinal 
canal stenosis L4-L5 is again seen. 
SOFT TISSUES: The bilateral abductor cuffs are preserved. The insertions of the 
iliopsoas tendons are intact. There is low-grade partial tearing of the origins 
of the hamstrings bilaterally. Rectus abdominis-adductor complex is preserved. 
The musculature is symmetric without strain, atrophy or mass. No focal fluid 
collection or distended bursa. Specifically, no iliopsoas or trochanteric 
bursitis. Included neurovascular bundles are negative. Intrapelvic there are 
small bilateral fat-containing inguinal hernias. There is mild edema within the 
dorsal midline subcutaneous tissues. Prostate TURP defect. No enhancing mass.
IMPRESSION: 1.  Scattered degenerative changes moderately advanced involvement. 
2.  Avascular necrosis without subarticular collapse anterior right femoral 
head. 
3.  Low-grade partial tearing of the origins of the hamstrings.

## 2023-05-24 IMAGING — DX CHEST PA AND LATERAL
2 series · 2 of 2 positions shown · non-contrast
Comparison: None.

________________________________________________________________________________________________ 
CLINICAL INDICATION: Acute Bronchitis, Unspecified.

[PA]
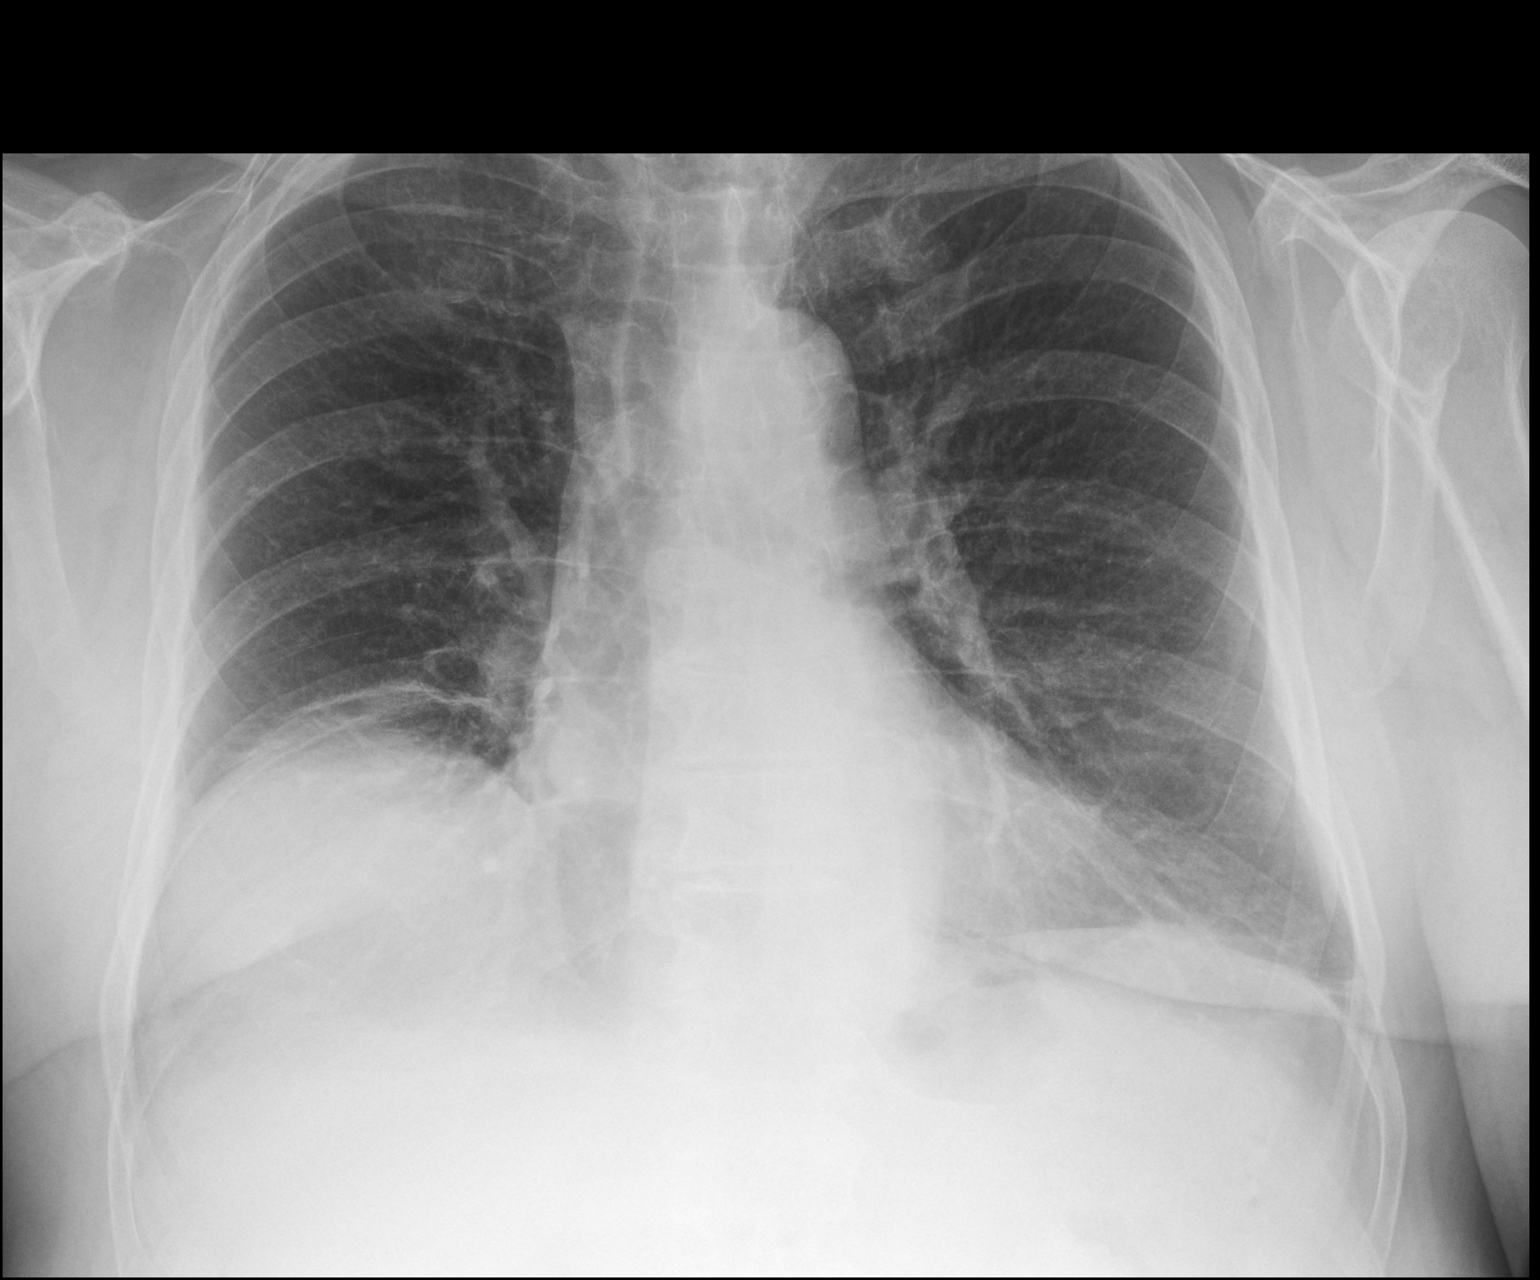

[lateral]
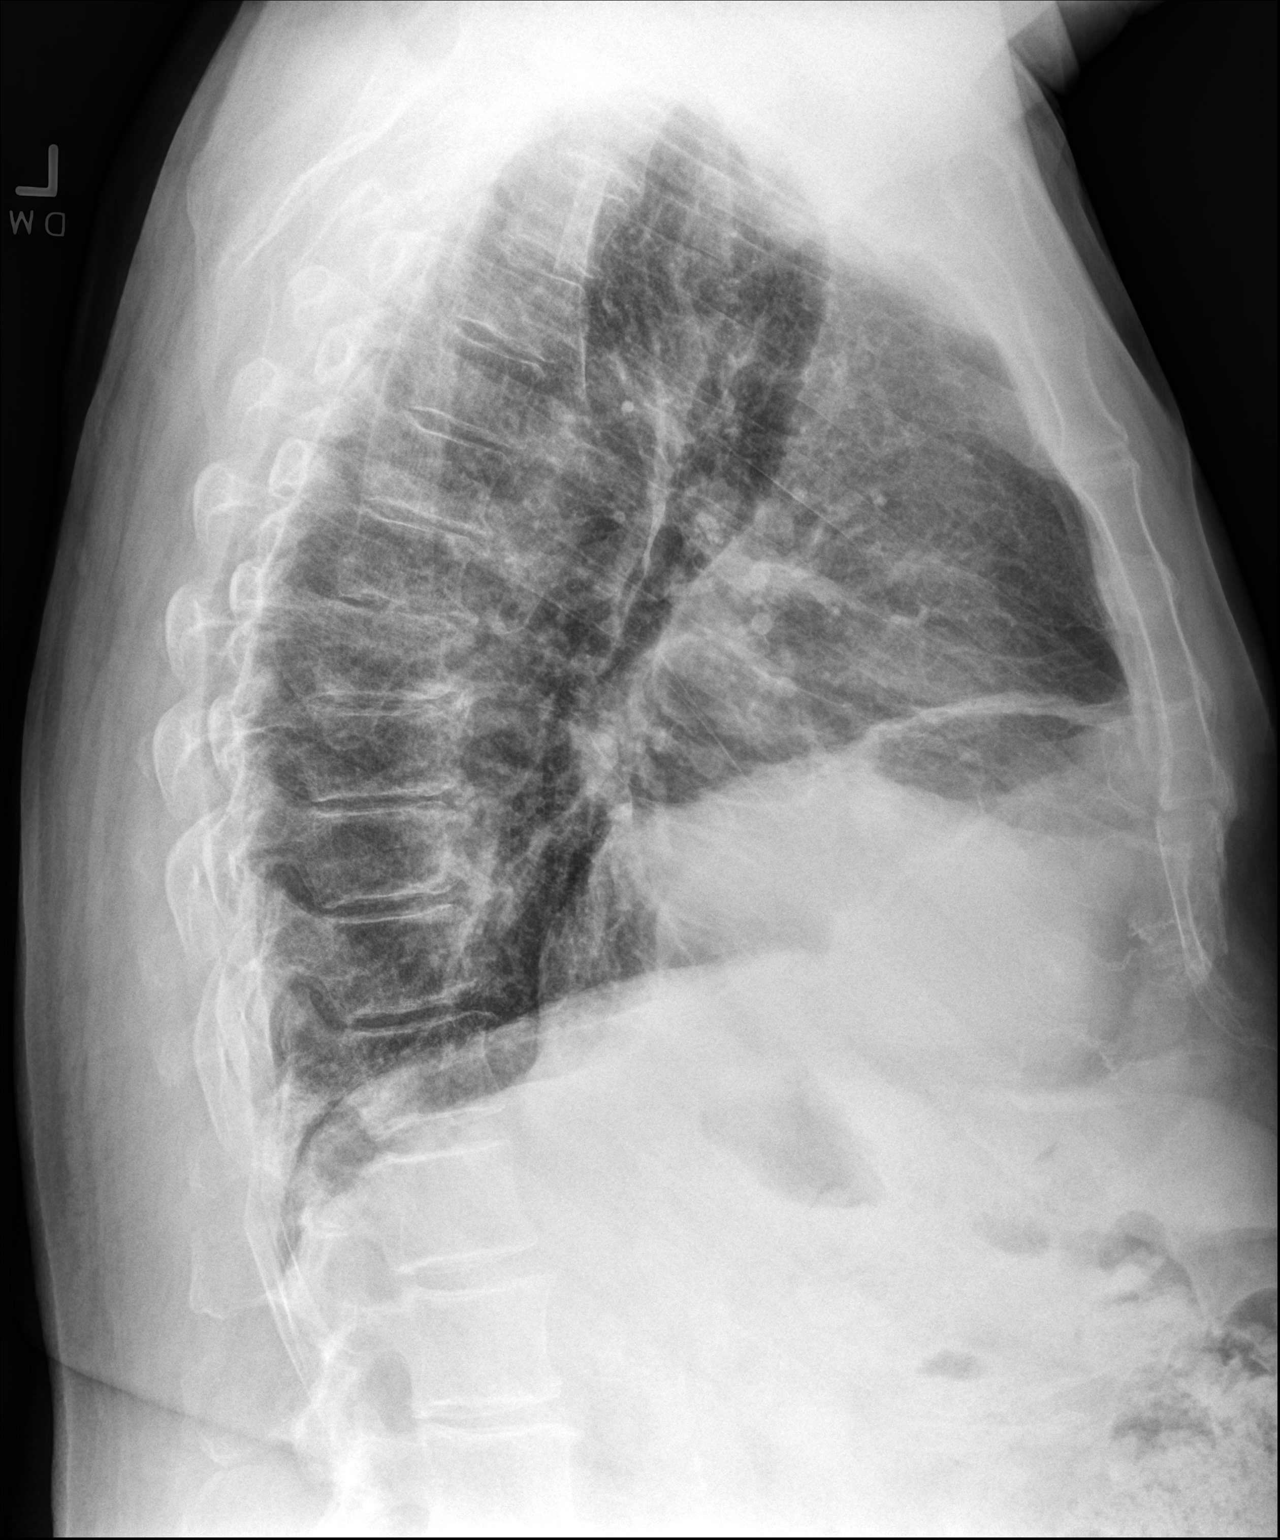

[2 of 2 positions shown; findings below may reference images not displayed]

FINDINGS: Increased density identified within the lung bases as well as 
anteriorly on the lateral view. Cannot exclude consolidation especially right 
middle lobe. Some of this may be accentuated by the elevated right diaphragm. No 
pleural effusion seen.
IMPRESSION: Areas of suspected at least atelectasis. Cannot exclude a developing 
consolidation right middle lobe. CT would better define.

## 2023-06-06 IMAGING — CT CT CHEST WITH CONTRAST
2 of 4 series · 15 of 36 positions shown, 18 images · IV contrast (APPLIED)
Comparison: 01/22/2023   
Count of known CT and Cardiac Nuclear Medicine studies performed in the previous 
12 months = 0.

________________________________________________________________________________________________ 
CT CHEST WITH CONTRAST, 06/06/2023 [DATE]: 
CLINICAL INDICATION: Other nonspecific abnormal finding of lung field . 
Atelectasis versus consolidation in the right middle lobe on chest x-ray  
A search for DICOM formatted images was conducted for prior CT imaging studies 
completed at a non-affiliated media free facility.
TECHNIQUE: The chest was scanned from base of neck through the lung bases with 
75 mL of Isovue 300 MDV injected intravenously on a high resolution low dose CT 
scanner. Routine MPR and MIP reconstruction images were performed.

[Series 4: axial · axial · 0.83mm/px · z∈[-339,-43]mm · 12 of 164 slices shown, 15 images]
[im 8/164  mediastinal]
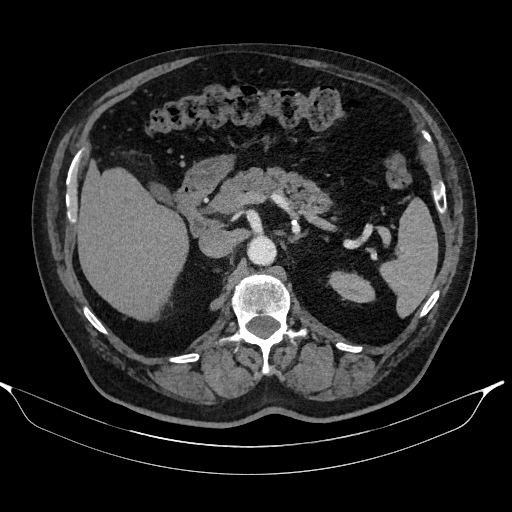
[im 8/164  lung]
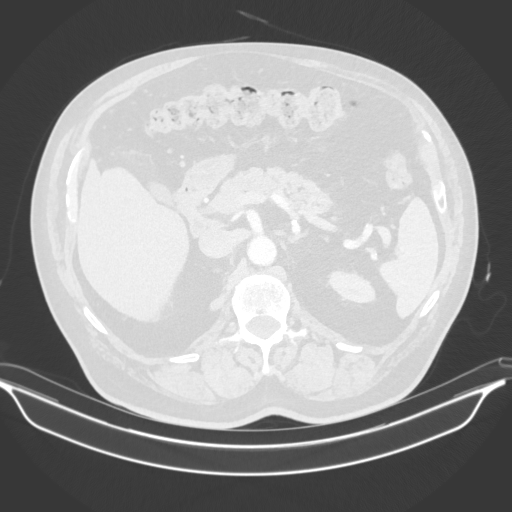
[im 23/164  lung]
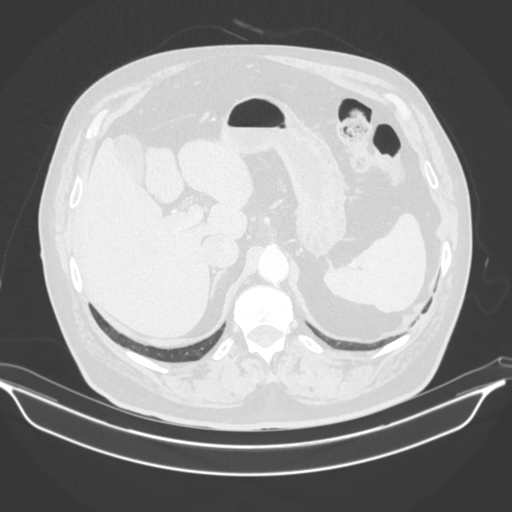
[im 38/164  lung]
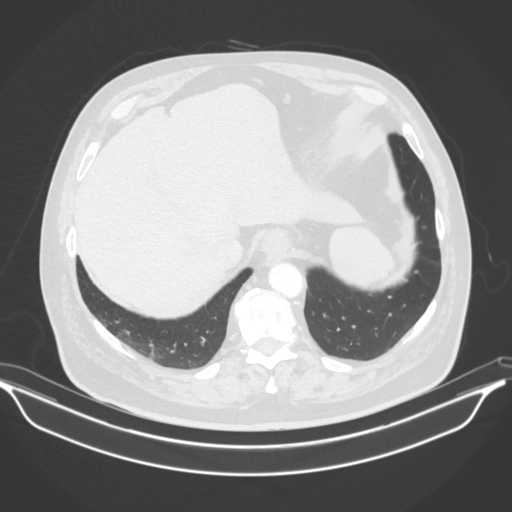
[im 52/164  lung]
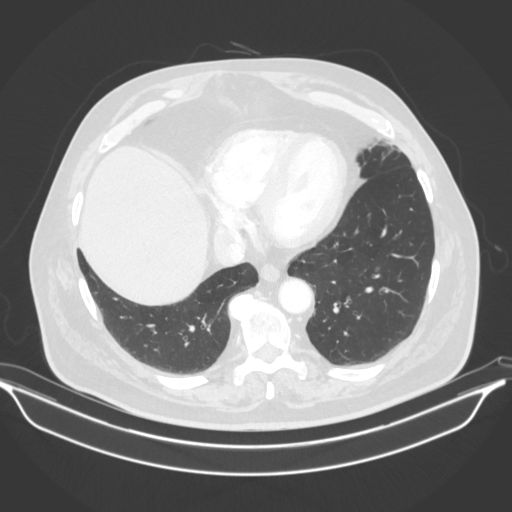
[im 60/164  mediastinal]
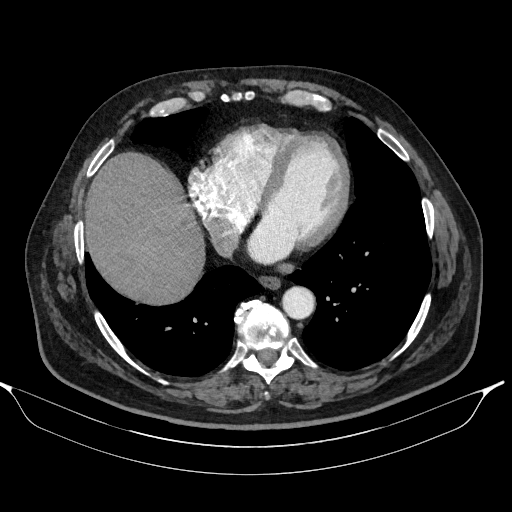
[im 60/164  lung]
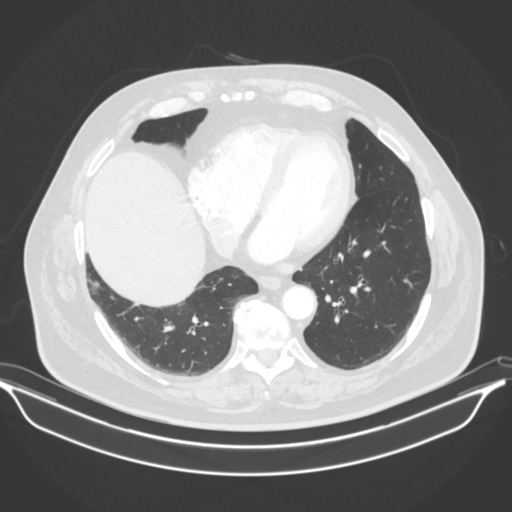
[im 75/164  lung]
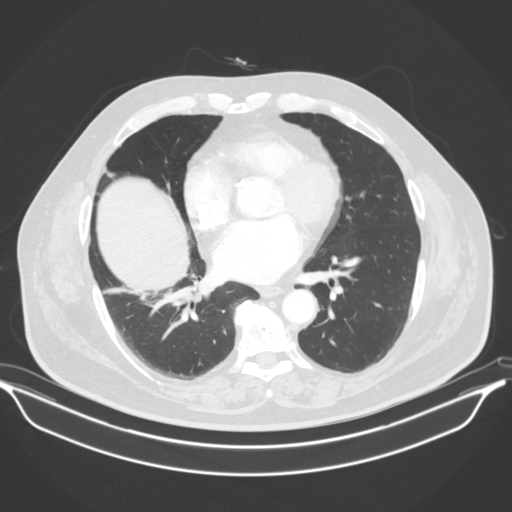
[im 89/164  lung]
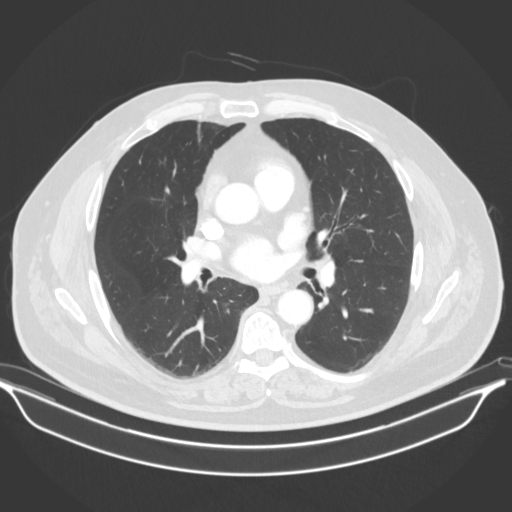
[im 104/164  lung]
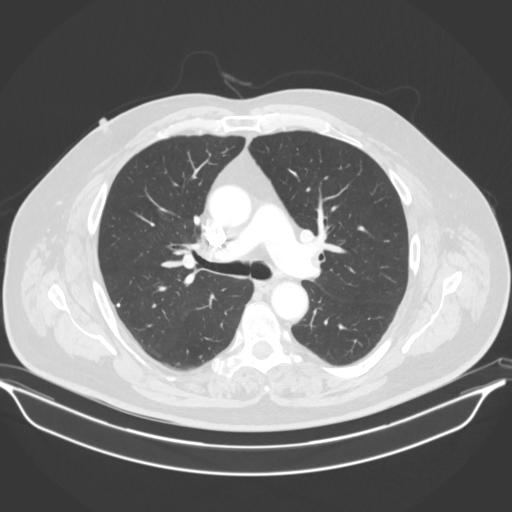
[im 112/164  mediastinal]
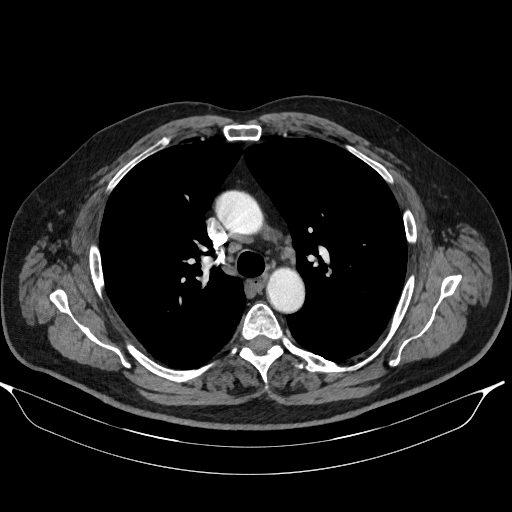
[im 112/164  lung]
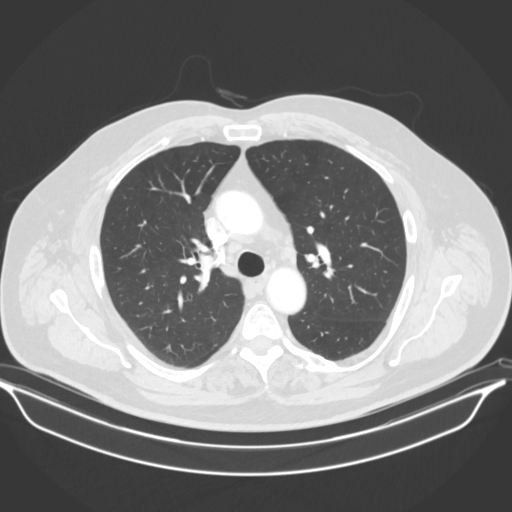
[im 126/164  lung]
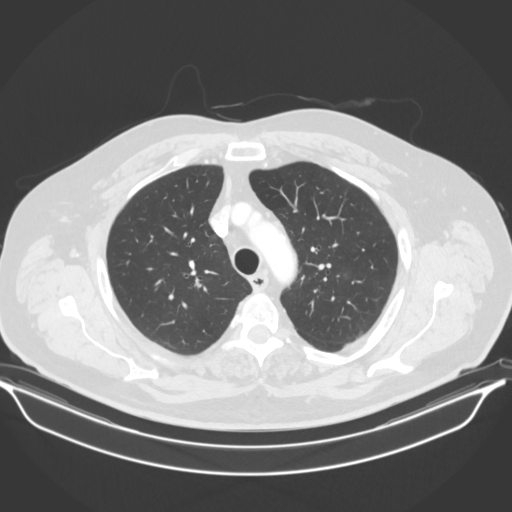
[im 141/164  lung]
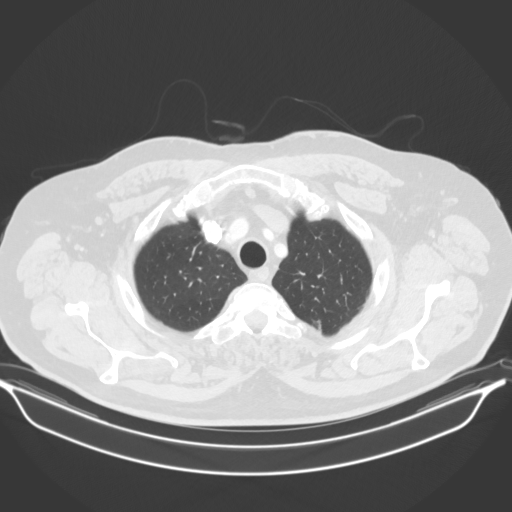
[im 156/164  lung]
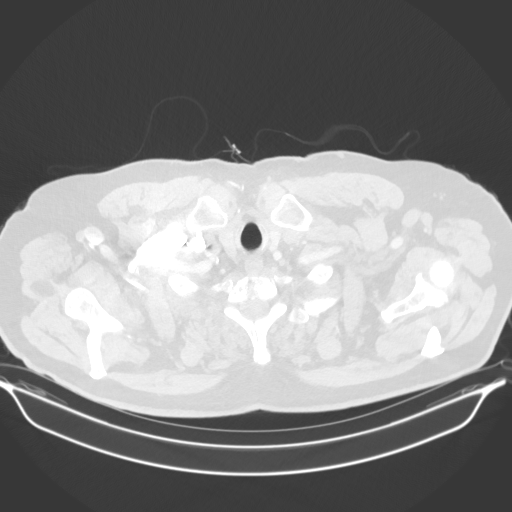

[Series 6: cor · coronal · 0.64mm/px · 3 of 141 slices shown]
[im 29/141  lung]
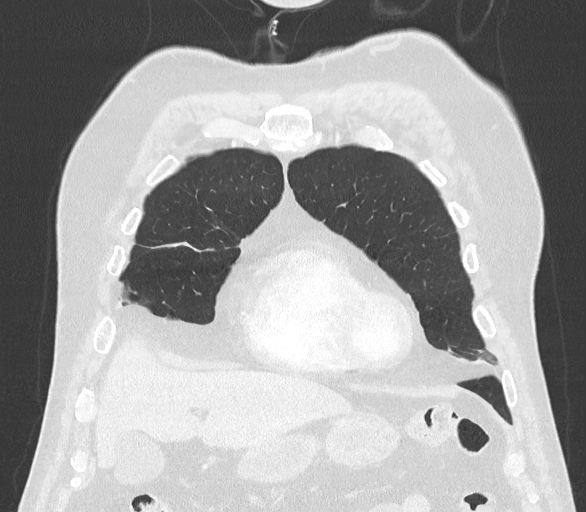
[im 57/141  lung]
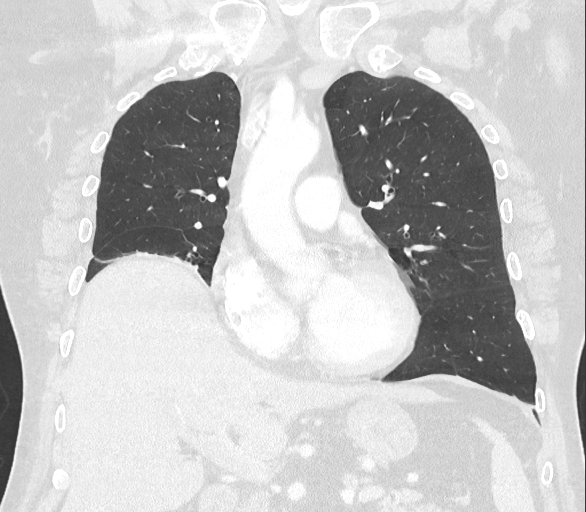
[im 85/141  lung]
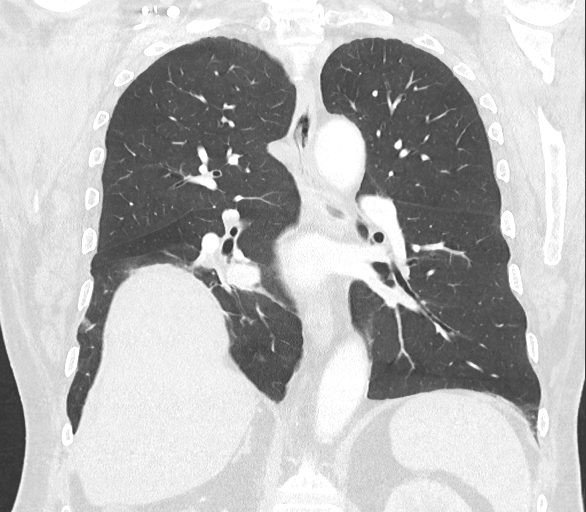

[15 of 36 positions shown; findings below may reference images not displayed]

FINDINGS: LUNGS AND PLEURA:  Mild volume loss in the right lung with mild linear 
atelectasis versus scarring in the right middle lobe. 2 mm calcified granuloma 
in the right upper lobe as seen on axial image #61 of series #5. No pulmonary 
nodules seen in the lungs. No pleural effusion.  
MEDIASTINUM:  No adenopathy. Normal heart size. No pericardial effusion. 
Moderate coronary artery calcifications noted with coronary stents in place. 
CHEST WALL/AXILLA: No mass or adenopathy.  
UPPER ABDOMEN: Negative. 
MUSCULOSKELETAL: No acute abnormality.
IMPRESSION: Mild volume loss in the right lung with mild linear atelectasis versus scarring 
in the right middle lobe.  
2 mm calcified granuloma in the right upper lobe as seen on axial image #61 of 
series #5. No pulmonary nodules seen in the lungs.  
Moderate coronary artery calcifications noted with coronary stents in place. 
In patients between the ages of 50-77 where pulmonary emphysema is noted on CT, 
recommend evaluation for low dose lung cancer screening protocol if patient is 
not already enrolled; as pulmonary emphysema is an independent risk factor for 
lung cancer. 
RADIATION DOSE REDUCTION: All CT scans are performed using radiation dose 
reduction techniques, when applicable.  Technical factors are evaluated and 
adjusted to ensure appropriate moderation of exposure.  Automated dose 
management technology is applied to adjust the radiation doses to minimize 
exposure while achieving diagnostic quality images.
